# Patient Record
Sex: Male | Born: 1955
Health system: Southern US, Community
[De-identification: ages and names within clinical notes are randomized; demographics above are authoritative.]

## PROBLEM LIST (undated history)

## (undated) DIAGNOSIS — E119 Type 2 diabetes mellitus without complications: Secondary | ICD-10-CM

## (undated) DIAGNOSIS — F329 Major depressive disorder, single episode, unspecified: Secondary | ICD-10-CM

## (undated) DIAGNOSIS — Z87442 Personal history of urinary calculi: Secondary | ICD-10-CM

## (undated) DIAGNOSIS — R251 Tremor, unspecified: Secondary | ICD-10-CM

## (undated) DIAGNOSIS — K219 Gastro-esophageal reflux disease without esophagitis: Secondary | ICD-10-CM

## (undated) DIAGNOSIS — G4733 Obstructive sleep apnea (adult) (pediatric): Secondary | ICD-10-CM

## (undated) DIAGNOSIS — M24111 Other articular cartilage disorders, right shoulder: Secondary | ICD-10-CM

## (undated) DIAGNOSIS — M1990 Unspecified osteoarthritis, unspecified site: Secondary | ICD-10-CM

## (undated) DIAGNOSIS — M7551 Bursitis of right shoulder: Secondary | ICD-10-CM

## (undated) DIAGNOSIS — R000 Tachycardia, unspecified: Secondary | ICD-10-CM

## (undated) HISTORY — PX: FL INJ RT KNEE CT ARTHROGRAM (ARMC HX): HXRAD1306

## (undated) HISTORY — DX: Type 2 diabetes mellitus without complications: E11.9

## (undated) HISTORY — DX: Tachycardia, unspecified: R00.0

## (undated) HISTORY — PX: OTHER SURGICAL HISTORY: SHX169

## (undated) HISTORY — DX: Obstructive sleep apnea (adult) (pediatric): G47.33

## (undated) HISTORY — DX: Major depressive disorder, single episode, unspecified: F32.9

## (undated) HISTORY — DX: Bursitis of right shoulder: M75.51

## (undated) HISTORY — DX: Gastro-esophageal reflux disease without esophagitis: K21.9

## (undated) HISTORY — DX: Other articular cartilage disorders, right shoulder: M24.111

## (undated) HISTORY — PX: COLONOSCOPY: SHX174

## (undated) HISTORY — DX: Tremor, unspecified: R25.1

---

## 2008-08-11 ENCOUNTER — Encounter: Payer: Self-pay | Admitting: Internal Medicine

## 2012-07-30 DIAGNOSIS — F410 Panic disorder [episodic paroxysmal anxiety] without agoraphobia: Secondary | ICD-10-CM | POA: Insufficient documentation

## 2012-07-30 DIAGNOSIS — N529 Male erectile dysfunction, unspecified: Secondary | ICD-10-CM

## 2012-07-30 DIAGNOSIS — E785 Hyperlipidemia, unspecified: Secondary | ICD-10-CM | POA: Insufficient documentation

## 2012-07-30 DIAGNOSIS — K2100 Gastro-esophageal reflux disease with esophagitis, without bleeding: Secondary | ICD-10-CM | POA: Insufficient documentation

## 2012-07-30 DIAGNOSIS — E119 Type 2 diabetes mellitus without complications: Secondary | ICD-10-CM | POA: Insufficient documentation

## 2012-07-30 DIAGNOSIS — I10 Essential (primary) hypertension: Secondary | ICD-10-CM

## 2012-07-30 HISTORY — DX: Essential (primary) hypertension: I10

## 2012-07-30 HISTORY — DX: Hyperlipidemia, unspecified: E78.5

## 2012-07-30 HISTORY — DX: Male erectile dysfunction, unspecified: N52.9

## 2012-07-30 HISTORY — DX: Panic disorder (episodic paroxysmal anxiety): F41.0

## 2012-07-30 HISTORY — DX: Gastro-esophageal reflux disease with esophagitis, without bleeding: K21.00

## 2012-07-30 HISTORY — DX: Type 2 diabetes mellitus without complications: E11.9

## 2017-05-12 DIAGNOSIS — K219 Gastro-esophageal reflux disease without esophagitis: Secondary | ICD-10-CM | POA: Insufficient documentation

## 2017-05-12 DIAGNOSIS — F411 Generalized anxiety disorder: Secondary | ICD-10-CM

## 2017-05-12 DIAGNOSIS — F419 Anxiety disorder, unspecified: Secondary | ICD-10-CM | POA: Insufficient documentation

## 2017-05-12 HISTORY — DX: Generalized anxiety disorder: F41.1

## 2018-08-03 ENCOUNTER — Encounter (INDEPENDENT_AMBULATORY_CARE_PROVIDER_SITE_OTHER): Payer: Self-pay | Admitting: *Deleted

## 2018-08-03 ENCOUNTER — Ambulatory Visit (INDEPENDENT_AMBULATORY_CARE_PROVIDER_SITE_OTHER): Payer: BC Managed Care – PPO | Admitting: Internal Medicine

## 2018-08-03 ENCOUNTER — Encounter (INDEPENDENT_AMBULATORY_CARE_PROVIDER_SITE_OTHER): Payer: Self-pay | Admitting: Internal Medicine

## 2018-08-03 ENCOUNTER — Telehealth (INDEPENDENT_AMBULATORY_CARE_PROVIDER_SITE_OTHER): Payer: Self-pay | Admitting: *Deleted

## 2018-08-03 VITALS — BP 170/90 | HR 64 | Temp 98.0°F | Ht 71.0 in | Wt 218.9 lb

## 2018-08-03 DIAGNOSIS — R195 Other fecal abnormalities: Secondary | ICD-10-CM

## 2018-08-03 HISTORY — DX: Other fecal abnormalities: R19.5

## 2018-08-03 MED ORDER — SUPREP BOWEL PREP KIT 17.5-3.13-1.6 GM/177ML PO SOLN: 1.0000 | Freq: Once | ORAL | 0 refills | Status: AC

## 2018-08-03 NOTE — Telephone Encounter (Signed)
Patient needs suprep 

## 2018-08-03 NOTE — Patient Instructions (Signed)
The risks of bleeding, perforation and infection were reviewed with patient.  

## 2018-08-03 NOTE — Progress Notes (Addendum)
   Subjective:    Patient ID: Paul Webb, male    DOB: 1956/09/03, 62 y.o.   MRN: [id number]  HPI Referred by Dr. Woody Seller for positive stool card/colonoscopy. Has not seen any blood in stool. No change in his stool.  Patient is adopted and unknown if family hx of colon cancer. His appetite is good. No unintentional weight loss.  GERD controlled with Omeprazole.  Last colonoscopy was 2009 and was normal. (Dr. Laural Golden).  06/19/2018 H and H 16.0 and 46.8    Diabetic x 21 yrs.   Review of Systems Past Medical History:  Diagnosis Date  . Diabetes ([place])   . Fast heart beat   . GERD (gastroesophageal reflux disease)     History reviewed. No pertinent surgical history.  No Known Allergies  Current Outpatient Medications on File Prior to Visit  Medication Sig Dispense Refill  . aspirin EC 325 MG tablet Take 325 mg by mouth daily.    [name] atenolol (TENORMIN) 50 MG tablet Take 50 mg by mouth daily.    . cetirizine (ZYRTEC) 10 MG tablet Take 10 mg by mouth daily.    . insulin aspart (NOVOLOG) 100 UNIT/ML injection Inject into the skin 3 (three) times daily before meals. Insulin pump    . LISINOPRIL PO Take by mouth.    [name] omeprazole (PRILOSEC) 40 MG capsule Take 40 mg by mouth daily.    . pravastatin (PRAVACHOL) 80 MG tablet Take 80 mg by mouth daily.    [name] venlafaxine (EFFEXOR) 75 MG tablet Take 75 mg by mouth 2 (two) times daily.    [name] venlafaxine XR (EFFEXOR-XR) 150 MG 24 hr capsule Take 150 mg by mouth daily with breakfast.     No current facility-administered medications on file prior to visit.         Objective:   Physical Exam Blood pressure (!) 170/90, pulse 64, temperature 98 F (36.7 C), height 5\' 11"  (1.803 m), weight 218 lb 14.4 oz (99.3 kg). Alert and oriented. Skin warm and dry. Oral mucosa is moist.   . Sclera anicteric, conjunctivae is pink. Thyroid not enlarged. No cervical lymphadenopathy. Lungs clear. Heart regular rate and rhythm.  Abdomen is soft. Bowel sounds are  positive. No hepatomegaly. No abdominal masses felt. No tenderness.  No edema to lower extremities.           Assessment & Plan:  Guaiac positive stool card. Colonic neoplasm needs to be ruled out.  Hemorrhoids, polps, AVMs in differential diagnosis.

## 2018-10-06 ENCOUNTER — Encounter (HOSPITAL_COMMUNITY): Payer: Self-pay | Admitting: *Deleted

## 2018-10-06 ENCOUNTER — Ambulatory Visit (HOSPITAL_COMMUNITY)
Admission: RE | Admit: 2018-10-06 | Discharge: 2018-10-06 | Disposition: A | Discharge status: Home / Self Care | Payer: BC Managed Care – PPO | Source: Ambulatory Visit | Attending: Internal Medicine | Admitting: Internal Medicine

## 2018-10-06 ENCOUNTER — Other Ambulatory Visit: Payer: Self-pay

## 2018-10-06 ENCOUNTER — Encounter (HOSPITAL_COMMUNITY)
Admission: RE | Disposition: A | Discharge status: Home / Self Care | Payer: Self-pay | Source: Ambulatory Visit | Attending: Internal Medicine

## 2018-10-06 DIAGNOSIS — E119 Type 2 diabetes mellitus without complications: Secondary | ICD-10-CM | POA: Insufficient documentation

## 2018-10-06 DIAGNOSIS — K644 Residual hemorrhoidal skin tags: Secondary | ICD-10-CM | POA: Insufficient documentation

## 2018-10-06 DIAGNOSIS — K219 Gastro-esophageal reflux disease without esophagitis: Secondary | ICD-10-CM | POA: Diagnosis not present

## 2018-10-06 DIAGNOSIS — R195 Other fecal abnormalities: Secondary | ICD-10-CM

## 2018-10-06 DIAGNOSIS — D123 Benign neoplasm of transverse colon: Secondary | ICD-10-CM | POA: Diagnosis not present

## 2018-10-06 DIAGNOSIS — Z87442 Personal history of urinary calculi: Secondary | ICD-10-CM | POA: Diagnosis not present

## 2018-10-06 DIAGNOSIS — Z79899 Other long term (current) drug therapy: Secondary | ICD-10-CM | POA: Diagnosis not present

## 2018-10-06 DIAGNOSIS — Z7982 Long term (current) use of aspirin: Secondary | ICD-10-CM | POA: Insufficient documentation

## 2018-10-06 DIAGNOSIS — K6289 Other specified diseases of anus and rectum: Secondary | ICD-10-CM

## 2018-10-06 DIAGNOSIS — Z794 Long term (current) use of insulin: Secondary | ICD-10-CM | POA: Insufficient documentation

## 2018-10-06 HISTORY — PX: POLYPECTOMY: SHX5525

## 2018-10-06 HISTORY — DX: Personal history of urinary calculi: Z87.442

## 2018-10-06 HISTORY — PX: COLONOSCOPY: SHX5424

## 2018-10-06 SURGERY — COLONOSCOPY
Anesthesia: Moderate Sedation

## 2018-10-06 MED ORDER — STERILE WATER FOR IRRIGATION IR SOLN
Status: DC | PRN
  Administered 2018-10-06: 1.5 mL

## 2018-10-06 MED ORDER — MIDAZOLAM HCL 5 MG/5ML IJ SOLN
INTRAMUSCULAR | Status: DC | PRN
  Administered 2018-10-06: 2 mg via INTRAVENOUS
  Administered 2018-10-06 (×2): 1 mg via INTRAVENOUS
  Administered 2018-10-06 (×3): 2 mg via INTRAVENOUS

## 2018-10-06 MED ORDER — MIDAZOLAM HCL 5 MG/5ML IJ SOLN
INTRAMUSCULAR | Status: AC
  Filled 2018-10-06: qty 10

## 2018-10-06 MED ORDER — SODIUM CHLORIDE 0.9 % IV SOLN
INTRAVENOUS | Status: DC
  Administered 2018-10-06: 1000 mL via INTRAVENOUS

## 2018-10-06 MED ORDER — MEPERIDINE HCL 50 MG/ML IJ SOLN
INTRAMUSCULAR | Status: DC | PRN
  Administered 2018-10-06 (×2): 25 mg

## 2018-10-06 MED ORDER — MEPERIDINE HCL 50 MG/ML IJ SOLN
INTRAMUSCULAR | Status: AC
  Filled 2018-10-06: qty 1

## 2018-10-06 NOTE — H&P (Signed)
Paul Webb is an 62 y.o. male.   Chief Complaint: Patient is here for colonoscopy. HPI: Patient is 62 year old Caucasian male who was found to have heme positive stool and is therefore undergoing diagnostic colonoscopy.  He is on low-dose aspirin.  Last dose was 3 days ago.  He does not take other OTC NSAIDs.  He denies abdominal pain change in bowel habits melena or rectal bleeding.  Last colonoscopy was normal in September 2009. Family history is not available as he was adopted.  Past Medical History:  Diagnosis Date  . Diabetes ([place])   . Fast heart beat   . GERD (gastroesophageal reflux disease)   . History of kidney stones     Past Surgical History:  Procedure Laterality Date  . COLONOSCOPY    . FL INJ RT KNEE CT ARTHROGRAM ([facility] HX)    . Rod in lower leg     trauma  . Rt hernia repair      History reviewed. No pertinent family history. Social History:  reports that he has never smoked. He has never used smokeless tobacco. He reports that he does not drink alcohol or use drugs.  Allergies: No Known Allergies  Medications Prior to Admission  Medication Sig Dispense Refill  . aspirin EC 81 MG tablet Take 81 mg by mouth daily.    [name] atenolol (TENORMIN) 50 MG tablet Take 50 mg by mouth at bedtime.     . cetirizine (ZYRTEC) 10 MG tablet Take 10 mg by mouth at bedtime.     . Cholecalciferol (VITAMIN D) 2000 units tablet Take 2,000 Units by mouth daily.    . fluticasone furoate-vilanterol (BREO ELLIPTA) 100-25 MCG/INH AEPB Inhale 1 puff into the lungs daily as needed (shortness of breath).    [name] ibuprofen (ADVIL,MOTRIN) 200 MG tablet Take 800 mg by mouth daily as needed for headache or moderate pain.    [name] insulin lispro (HUMALOG) 100 UNIT/ML injection Inject 2.5 Units into the skin continuous. Use in insulin pump, 2.5 units per hour    . lisinopril-hydrochlorothiazide (PRINZIDE,ZESTORETIC) 20-25 MG tablet Take 1 tablet by mouth daily.    . Omega-3 Fatty Acids (FISH OIL) 1000 MG CAPS  Take 1,000 mg by mouth daily.    [name] omeprazole (PRILOSEC) 40 MG capsule Take 40 mg by mouth daily.    . pravastatin (PRAVACHOL) 80 MG tablet Take 80 mg by mouth at bedtime.     [name] venlafaxine XR (EFFEXOR-XR) 150 MG 24 hr capsule Take 150 mg by mouth daily with breakfast.    . venlafaxine XR (EFFEXOR-XR) 75 MG 24 hr capsule Take 75 mg by mouth at bedtime.      No results found for this or any previous visit (from the past 48 hour(s)). No results found.  ROS  Blood pressure 130/78, pulse 74, temperature 97.9 F (36.6 C), temperature source Oral, resp. rate 14, height 5\' 11"  (1.803 m), weight 98.4 kg, SpO2 95 %. Physical Exam  Constitutional: He appears well-developed and well-nourished.  HENT:  Mouth/Throat: Oropharynx is clear and moist.  Eyes: Conjunctivae are normal. No scleral icterus.  Neck: No thyromegaly present.  Cardiovascular: Normal rate, regular rhythm and normal heart sounds.  No murmur heard. Respiratory: Effort normal and breath sounds normal.  GI:  Abdomen is full but soft and nontender with organomegaly or masses.  Musculoskeletal: He exhibits no edema.  Neurological: He is alert.  Skin: Skin is warm.     Assessment/Plan Heme positive stool. Diagnostic colonoscopy.  Hildred Laser, MD  10/06/2018, 1:34 PM

## 2018-10-06 NOTE — Op Note (Signed)
[facility] Patient Name: Paul Webb Procedure Date: 10/06/2018 1:15 PM MRN: [id number] Date of Birth: Oct 21, 1956 Attending MD: Hildred Laser , MD CSN: [id number] Age: 62 Admit Type: Outpatient Procedure:                Colonoscopy Indications:              Heme positive stool Providers:                Hildred Laser, MD, Lurline Del, RN, Gerome Sam,                            RN, Randa Spike, Technician Referring MD:             Glenda Chroman, MD Medicines:                Meperidine 50 mg IV, Midazolam 10 mg IV Complications:            No immediate complications. Estimated Blood Loss:     Estimated blood loss was minimal. Procedure:                Pre-Anesthesia Assessment:                           - Prior to the procedure, a History and Physical                            was performed, and patient medications and                            allergies were reviewed. The patient's tolerance of                            previous anesthesia was also reviewed. The risks                            and benefits of the procedure and the sedation                            options and risks were discussed with the patient.                            All questions were answered, and informed consent                            was obtained. Prior Anticoagulants: The patient                            last took aspirin 3 days prior to the procedure.                            ASA Grade Assessment: II - A patient with mild                            systemic disease. After reviewing the risks and  benefits, the patient was deemed in satisfactory                            condition to undergo the procedure.                           After obtaining informed consent, the colonoscope                            was passed under direct vision. Throughout the                            procedure, the patient's blood pressure, pulse, and       oxygen saturations were monitored continuously. The                            CF-HQ190L ([id number]) scope was introduced through                            the anus and advanced to the the cecum, identified                            by appendiceal orifice and ileocecal valve. The                            colonoscopy was performed without difficulty. The                            patient tolerated the procedure well. The quality                            of the bowel preparation was adequate. The                            ileocecal valve, appendiceal orifice, and rectum                            were photographed. Scope In: 1:53:32 PM Scope Out: 2:20:52 PM Scope Withdrawal Time: 0 hours 15 minutes 14 seconds  Total Procedure Duration: 0 hours 27 minutes 20 seconds  Findings:      The perianal and digital rectal examinations were normal.      A small polyp was found in the splenic flexure. The polyp was sessile.       The polyp was removed with a cold snare. Resection and retrieval were       complete.      The exam was otherwise normal throughout the examined colon.      External hemorrhoids were found during retroflexion. The hemorrhoids       were small.      Anal papilla(e) were hypertrophied. Impression:               - One small polyp at the splenic flexure, removed                            with a cold  snare. Resected and retrieved.                           - External hemorrhoids.                           - Anal papilla(e) were hypertrophied. Moderate Sedation:      Moderate (conscious) sedation was administered by the endoscopy nurse       and supervised by the endoscopist. The following parameters were       monitored: oxygen saturation, heart rate, blood pressure, CO2       capnography and response to care. Total physician intraservice time was       35 minutes. Recommendation:           - Patient has a contact number available for                             emergencies. The signs and symptoms of potential                            delayed complications were discussed with the                            patient. Return to normal activities tomorrow.                            Written discharge instructions were provided to the                            patient.                           - Resume previous diet today.                           - Continue present medications.                           - No aspirin, ibuprofen, naproxen, or other                            non-steroidal anti-inflammatory drugs for 1 day.                           - Await pathology results.                           - Repeat colonoscopy is recommended. The                            colonoscopy date will be determined after pathology                            results from today's exam become available for                            review.  Procedure Code(s):        --- Professional ---                           [id number], Colonoscopy, flexible; with removal of                            tumor(s), polyp(s), or other lesion(s) by snare                            technique                           99153, Moderate sedation; each additional 15                            minutes intraservice time                           G0500, Moderate sedation services provided by the                            same physician or other qualified health care                            professional performing a gastrointestinal                            endoscopic service that sedation supports,                            requiring the presence of an independent trained                            observer to assist in the monitoring of the                            patient's level of consciousness and physiological                            status; initial 15 minutes of intra-service time;                            patient age 13 years or older (additional time may                             be reported with [phone], as appropriate) Diagnosis Code(s):        --- Professional ---                           D12.3, Benign neoplasm of transverse colon (hepatic                            flexure or splenic flexure)  K62.89, Other specified diseases of anus and rectum                           K64.4, Residual hemorrhoidal skin tags                           R19.5, Other fecal abnormalities CPT copyright 2018 American Medical Association. All rights reserved. The codes documented in this report are preliminary and upon coder review may  be revised to meet current compliance requirements. Hildred Laser, MD Hildred Laser, MD 10/06/2018 2:28:29 PM This report has been signed electronically. Number of Addenda: 0

## 2018-10-06 NOTE — Progress Notes (Addendum)
Pt CBG is 216 at 1340pm1.0 unit bolus was delivered via pts insulin pump  Dex Blakely Rica Mote, RN

## 2018-10-06 NOTE — Discharge Instructions (Signed)
No aspirin or NSAIDs for 24 hours. Resume other medications and diet as before. No driving for 24 hours. Physician will call with biopsy results.  Colonoscopy, Adult, Care After This sheet gives you information about how to care for yourself after your procedure. Your health care provider may also give you more specific instructions. If you have problems or questions, contact your health care provider. Dr. Laural Golden: [phone].  After hours and weekends call the hospital and have the GI doctor on call paged.  They will call you back. What can I expect after the procedure? After the procedure, it is common to have:  A small amount of blood in your stool for 24 hours after the procedure.  Some gas.  Mild abdominal cramping or bloating.  Follow these instructions at home: General instructions   For the first 24 hours after the procedure: ? Do not drive or use machinery. ? Do not sign important documents. ? Do not drink alcohol. ? Do your  activities at a slower pace than normal. ? Rest often.  Take over-the-counter or prescription medicines only as told by your health care provider.  It is up to you to get the results of your procedure. Ask your health care provider, or the department performing the procedure, when your results will be ready. Relieving cramping and bloating  Try walking around when you have cramps or feel bloated.  Drink enough fluid to keep your urine clear or pale yellow.  Resume your normal diet as instructed by your health care provider. Avoid heavy or fried foods that are hard to digest.  Avoid drinking alcohol for as long as instructed by your health care provider. Contact a health care provider if:  You have blood in your stool 2-3 days after the procedure. Get help right away if:  You have more than a small spotting of blood in your stool.  You pass large blood clots in your stool.  Your abdomen is swollen.  You have nausea or vomiting.  You  have a fever.  You have increasing abdominal pain that is not relieved with medicine. This information is not intended to replace advice given to you by your health care provider. Make sure you discuss any questions you have with your health care provider. Document Released: 06/24/2004 Document Revised: 08/04/2016 Document Reviewed: 01/22/2016 Elsevier Interactive Patient Education  [employer].

## 2018-10-07 LAB — GLUCOSE, CAPILLARY
GLUCOSE-CAPILLARY: 216 mg/dL — AB (ref 70–99)
GLUCOSE-CAPILLARY: 200 mg/dL — AB (ref 70–99)

## 2018-10-12 ENCOUNTER — Encounter (HOSPITAL_COMMUNITY): Payer: Self-pay | Admitting: Internal Medicine

## 2018-10-25 ENCOUNTER — Encounter: Payer: Self-pay | Admitting: *Deleted

## 2018-10-26 ENCOUNTER — Encounter: Payer: Self-pay | Admitting: *Deleted

## 2018-10-26 ENCOUNTER — Encounter

## 2018-10-26 ENCOUNTER — Encounter: Payer: Self-pay | Admitting: Cardiovascular Disease

## 2018-10-26 ENCOUNTER — Ambulatory Visit: Payer: BC Managed Care – PPO | Admitting: Cardiovascular Disease

## 2018-10-26 ENCOUNTER — Telehealth: Payer: Self-pay | Admitting: Cardiovascular Disease

## 2018-10-26 VITALS — BP 130/84 | HR 81 | Ht 71.0 in | Wt 223.0 lb

## 2018-10-26 DIAGNOSIS — R079 Chest pain, unspecified: Secondary | ICD-10-CM

## 2018-10-26 DIAGNOSIS — I10 Essential (primary) hypertension: Secondary | ICD-10-CM | POA: Diagnosis not present

## 2018-10-26 DIAGNOSIS — E785 Hyperlipidemia, unspecified: Secondary | ICD-10-CM

## 2018-10-26 NOTE — Patient Instructions (Addendum)
Medication Instructions:   Your physician recommends that you continue on your current medications as directed. Please refer to the Current Medication list given to you today.  Labwork:  NONE  Testing/Procedures: Your physician has requested that you have en exercise stress myoview. For further information please visit [email or web address]. Please follow instruction sheet, as given.  Follow-Up:  Your physician recommends that you schedule a follow-up appointment in: 3 months.   Any Other Special Instructions Will Be Listed Below (If Applicable).  If you need a refill on your cardiac medications before your next appointment, please call your pharmacy.

## 2018-10-26 NOTE — Telephone Encounter (Signed)
Pre-cert Verification for the following procedure   EXERCISE MYOVIEW  Scheduled for 10-28-2018 at [facility]

## 2018-10-26 NOTE — Progress Notes (Signed)
CARDIOLOGY CONSULT NOTE  Patient ID: Paul Webb MRN: [id number] DOB/AGE: August 18, 1956 62 y.o.  Admit date: (Not on file) Primary Physician: Glenda Chroman, MD Referring Physician: Glenda Chroman, MD  Reason for Consultation: Chest pain  HPI: Paul Webb is a 62 y.o. male who is being seen today for the evaluation of chest pain at the request of Vyas, Dhruv B, MD.   Past medical history includes hypertension, insulin-dependent diabetes mellitus, and hyperlipidemia.  LDL 90 on 06/17/2018.  Additional labs include hemoglobin 16, platelets 246, total cholesterol 161, triglycerides 129, HDL 45, BUN 25, creatinine 1, sodium 139, potassium 3.7, TSH 1.24.  I reviewed notes from his PCP.  He apparently had an episode of chest pain in October which lasted for about 24 hours exacerbated by taking a deep breath.  He had an ECG at his PCPs office which is unavailable at the present time.  He prefers to be called Paul Webb.  He is here with his wife Santiago Glad who is a Marine scientist.  He told me about the episode of chest pain he experienced.  It began in the early afternoon and lasted into the night.  It was exacerbated by taking deep breaths.  He denied having a fever or cough at the time.  His wife auscultated his lungs and they were clear.  He denies palpitations, orthopnea, paroxysmal nocturnal dyspnea, and leg swelling.  He does have a history of anxiety and his wife wondered if he was having a panic attack.  He has not had one prior to that.  He has no prior episodes of chest pain and has had none since.  Family history is unknown as he is adopted.   No Known Allergies  Current Outpatient Medications  Medication Sig Dispense Refill  . aspirin EC 81 MG tablet Take 1 tablet (81 mg total) by mouth daily.    [name] atenolol (TENORMIN) 50 MG tablet Take 50 mg by mouth at bedtime.     . cetirizine (ZYRTEC) 10 MG tablet Take 10 mg by mouth at bedtime.     . Cholecalciferol (VITAMIN D) 2000 units tablet Take  2,000 Units by mouth daily.    . fluticasone furoate-vilanterol (BREO ELLIPTA) 100-25 MCG/INH AEPB Inhale 1 puff into the lungs daily as needed (shortness of breath).    . insulin lispro (HUMALOG) 100 UNIT/ML injection Inject 2.5 Units into the skin continuous. Use in insulin pump, 2.5 units per hour    . lisinopril-hydrochlorothiazide (PRINZIDE,ZESTORETIC) 20-25 MG tablet Take 1 tablet by mouth daily.    . Omega-3 Fatty Acids (FISH OIL) 1000 MG CAPS Take 1,000 mg by mouth daily.    [name] omeprazole (PRILOSEC) 40 MG capsule Take 40 mg by mouth daily.    . pravastatin (PRAVACHOL) 80 MG tablet Take 80 mg by mouth at bedtime.     [name] venlafaxine XR (EFFEXOR-XR) 150 MG 24 hr capsule Take 150 mg by mouth daily with breakfast.    . venlafaxine XR (EFFEXOR-XR) 75 MG 24 hr capsule Take 75 mg by mouth at bedtime.     No current facility-administered medications for this visit.     Past Medical History:  Diagnosis Date  . Diabetes ([place])   . Fast heart beat   . GERD (gastroesophageal reflux disease)   . History of kidney stones     Past Surgical History:  Procedure Laterality Date  . COLONOSCOPY    . COLONOSCOPY N/A 10/06/2018   Procedure: COLONOSCOPY;  Surgeon: Hildred Laser  U, MD;  Location: [facility];  Service: Endoscopy;  Laterality: N/A;  1:25  . FL INJ RT KNEE CT ARTHROGRAM ([facility] HX)    . POLYPECTOMY  10/06/2018   Procedure: POLYPECTOMY;  Surgeon: Rogene Houston, MD;  Location: [facility] SU[facility];  Service: Endoscopy;;  colon   . Rod in lower leg     trauma  . Rt hernia repair      Social History   Socioeconomic History  . Marital status: Married    Spouse name: Not on file  . Number of children: Not on file  . Years of education: Not on file  . Highest education level: Not on file  Occupational History  . Not on file  Social Needs  . Financial resource strain: Not on file  . Food insecurity:    Worry: Not on file    Inability: Not on file  . Transportation needs:     Medical: Not on file    Non-medical: Not on file  Tobacco Use  . Smoking status: Never Smoker  . Smokeless tobacco: Never Used  Substance and Sexual Activity  . Alcohol use: Never    Frequency: Never  . Drug use: Never  . Sexual activity: Not on file  Lifestyle  . Physical activity:    Days per week: Not on file    Minutes per session: Not on file  . Stress: Not on file  Relationships  . Social connections:    Talks on phone: Not on file    Gets together: Not on file    Attends religious service: Not on file    Active member of club or organization: Not on file    Attends meetings of clubs or organizations: Not on file    Relationship status: Not on file  . Intimate partner violence:    Fear of current or ex partner: Not on file    Emotionally abused: Not on file    Physically abused: Not on file    Forced sexual activity: Not on file  Other Topics Concern  . Not on file  Social History Narrative  . Not on file      Current Meds  Medication Sig  . aspirin EC 81 MG tablet Take 1 tablet (81 mg total) by mouth daily.  [name] atenolol (TENORMIN) 50 MG tablet Take 50 mg by mouth at bedtime.   . cetirizine (ZYRTEC) 10 MG tablet Take 10 mg by mouth at bedtime.   . Cholecalciferol (VITAMIN D) 2000 units tablet Take 2,000 Units by mouth daily.  . fluticasone furoate-vilanterol (BREO ELLIPTA) 100-25 MCG/INH AEPB Inhale 1 puff into the lungs daily as needed (shortness of breath).  . insulin lispro (HUMALOG) 100 UNIT/ML injection Inject 2.5 Units into the skin continuous. Use in insulin pump, 2.5 units per hour  . lisinopril-hydrochlorothiazide (PRINZIDE,ZESTORETIC) 20-25 MG tablet Take 1 tablet by mouth daily.  . Omega-3 Fatty Acids (FISH OIL) 1000 MG CAPS Take 1,000 mg by mouth daily.  [name] omeprazole (PRILOSEC) 40 MG capsule Take 40 mg by mouth daily.  . pravastatin (PRAVACHOL) 80 MG tablet Take 80 mg by mouth at bedtime.   [name] venlafaxine XR (EFFEXOR-XR) 150 MG 24 hr capsule Take 150 mg  by mouth daily with breakfast.  . venlafaxine XR (EFFEXOR-XR) 75 MG 24 hr capsule Take 75 mg by mouth at bedtime.      Review of systems complete and found to be negative unless listed above in HPI    Physical exam  Blood pressure 130/84, pulse 81, height 5\' 11"  (1.803 m), weight 223 lb (101.2 kg), SpO2 95 %. General: NAD Neck: No JVD, no thyromegaly or thyroid nodule.  Lungs: Clear to auscultation bilaterally with normal respiratory effort. CV: Nondisplaced PMI. Regular rate and rhythm, normal S1/S2, no S3/S4, no murmur.  No peripheral edema.  No carotid bruit.    Abdomen: Soft, nontender, no distention.  Skin: Intact without lesions or rashes.  Neurologic: Alert and oriented x 3.  Psych: Normal affect. Extremities: No clubbing or cyanosis.  HEENT: Normal.   ECG: Most recent ECG reviewed.   Labs: No results found for: K, BUN, CREATININE, ALT, TSH, HGB   Lipids: No results found for: LDLCALC, LDLDIRECT, CHOL, TRIG, HDL      ASSESSMENT AND PLAN:   1.  Chest pain: Unclear etiology.  He has several cardiovascular risk factors as detailed above.  I will proceed with an exercise Myoview stress test.  Atenolol will be held the day of the test.  2.  Hypertension: Blood pressure is controlled on present therapy.  No changes.  3.  Hyperlipidemia: Lipids reviewed above.  Continue pravastatin 80 mg.    Disposition: Follow up in 3 months  Signed: Kate Sable, M.D., F.A.C.C.  10/26/2018, 2:46 PM

## 2018-10-28 ENCOUNTER — Ambulatory Visit (HOSPITAL_COMMUNITY)
Admission: RE | Admit: 2018-10-28 | Discharge: 2018-10-28 | Disposition: A | Discharge status: Home / Self Care | Payer: BC Managed Care – PPO | Source: Ambulatory Visit | Attending: Cardiovascular Disease | Admitting: Cardiovascular Disease

## 2018-10-28 ENCOUNTER — Encounter (HOSPITAL_COMMUNITY)
Admission: RE | Admit: 2018-10-28 | Discharge: 2018-10-28 | Disposition: A | Discharge status: Home / Self Care | Payer: BC Managed Care – PPO | Source: Ambulatory Visit | Attending: Cardiovascular Disease | Admitting: Cardiovascular Disease

## 2018-10-28 ENCOUNTER — Encounter (HOSPITAL_COMMUNITY): Payer: Self-pay

## 2018-10-28 DIAGNOSIS — R079 Chest pain, unspecified: Secondary | ICD-10-CM

## 2018-10-28 LAB — NM MYOCAR MULTI W/SPECT W/WALL MOTION / EF
CHL CUP RESTING HR STRESS: 76 {beats}/min
CSEPED: 5 min
CSEPEDS: 50 s
CSEPEW: 7 METS
LVDIAVOL: 70 mL (ref 62–150)
LVSYSVOL: 20 mL
MPHR: 160 {beats}/min
Peak HR: 142 {beats}/min
Percent HR: 88 %
RATE: 0.36
RPE: 13
SDS: 4
SRS: 0
SSS: 4
TID: 0.9

## 2018-10-28 MED ORDER — TECHNETIUM TC 99M TETROFOSMIN IV KIT
30.0000 | PACK | Freq: Once | INTRAVENOUS | Status: AC | PRN
  Administered 2018-10-28: 32.5 via INTRAVENOUS

## 2018-10-28 MED ORDER — REGADENOSON 0.4 MG/5ML IV SOLN
INTRAVENOUS | Status: AC
  Filled 2018-10-28: qty 5

## 2018-10-28 MED ORDER — SODIUM CHLORIDE 0.9% FLUSH
INTRAVENOUS | Status: AC
  Administered 2018-10-28: 10 mL via INTRAVENOUS
  Filled 2018-10-28: qty 10

## 2018-10-28 MED ORDER — TECHNETIUM TC 99M TETROFOSMIN IV KIT
10.0000 | PACK | Freq: Once | INTRAVENOUS | Status: AC | PRN
  Administered 2018-10-28: 10.5 via INTRAVENOUS

## 2018-10-28 NOTE — Progress Notes (Signed)
Pt had near syncopal episode after IV insertion and does have history of this with needle sticks. Dr. Register accessed patient. Pt has no complaints currently and states he feels back to normal. No signs of distress vials WNL.

## 2018-10-28 NOTE — Progress Notes (Signed)
Pt had near fainting spell with iv-has happened before.No cp or sob.Pt feels fine now.Pt alert and ox3.vss.Pt to be seen by Cardiology this a.m.

## 2018-11-01 ENCOUNTER — Telehealth: Payer: Self-pay | Admitting: *Deleted

## 2018-11-01 NOTE — Telephone Encounter (Signed)
Notes recorded by Laurine Blazer, LPN on 62/06/3150 at 7:61 PM EST Wife Santiago Glad) notified. Copy to pmd. Follow up scheduled for March. ------  Notes recorded by Herminio Commons, MD on 10/28/2018 at 12:44 PM EST Low risk for blockages. Normal pumping function.

## 2019-01-26 ENCOUNTER — Ambulatory Visit: Payer: BC Managed Care – PPO | Admitting: Cardiovascular Disease

## 2019-02-10 NOTE — Progress Notes (Signed)
Psychiatric Initial Adult Assessment   Patient Identification: Paul Webb MRN:  [id number] Date of Evaluation:  02/16/2019 Referral Source: Glenda Chroman, MD Chief Complaint:  "My whole spirits are broken" Chief Complaint    Depression; Psychiatric Evaluation     Visit Diagnosis:    ICD-10-CM   1. Current moderate episode of major depressive disorder without prior episode (HCC) F32.1     History of Present Illness:   Paul Webb is a 63 y.o. year old male with a history of depression, diabetes, hyperlipidemia, who is referred for aggression.   Patient states that "My whole spirits are broken" over the past two years.  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  He wishes to become independent again, stating that he has "no choice" but to do things what his wife told him to. Although he may wish to do something, it has been very difficult for him to actually do it due to significant fatigue and anhedonia. He states that he used to be very active and independent. He wishes that his wife would understand him better, stating that "it's aggravates" when she is frustrated. He agrees that he sometimes feel isolated when his wife does not help him (to serve lunch) while she is helping people in the assisted living.   Santiago Glad, patient's wife presents to the interview with patient consent.  She states that he stays in the bed most of the time.  Although he may watches TV at times, he stays in the house and he does not go to church anymore. She denies safety concern.   He has insomnia.  He has significant fatigue. He has fair appetite. He has fair concentration. He had fleeting passive SI.  He feels anxious and tense. He feels irritable at times, although he believes it has improved lately. He occasionally has panic attacks. He denies alcohol use or drug use.   Wt Readings from Last 3 Encounters:  02/16/19 225 lb (102.1 kg)  10/26/18 223 lb (101.2 kg)  10/06/18 217  lb (98.4 kg)    Associated Signs/Symptoms: Depression Symptoms:  depressed mood, anhedonia, insomnia, fatigue, difficulty concentrating, anxiety,  (Hypo) Manic Symptoms:  denies decrease need for sleep, euphoria Anxiety Symptoms:  Excessive Worry, Psychotic Symptoms:  denies AH, VH,  PTSD Symptoms: Negative  Past Psychiatric History:  Outpatient: on venlafaxine for anxiety for 20 years Psychiatry admission: denies  Previous suicide attempt: denies  Past trials of medication: ?lexapro, venlafaxine,  History of violence: denies   Previous Psychotropic Medications: Yes   Substance Abuse History in the last 12 months:  No.  Consequences of Substance Abuse: NA  Past Medical History:  Past Medical History:  Diagnosis Date  . Diabetes ([place])   . Fast heart beat   . GERD (gastroesophageal reflux disease)   . History of kidney stones     Past Surgical History:  Procedure Laterality Date  . COLONOSCOPY    . COLONOSCOPY N/A 10/06/2018   Procedure: COLONOSCOPY;  Surgeon: Rogene Houston, MD;  [facility];  Service: Endoscopy;  Laterality: N/A;  1:25  . FL INJ RT KNEE CT ARTHROGRAM ([facility] HX)    . POLYPECTOMY  10/06/2018   Procedure: POLYPECTOMY;  Surgeon: Rogene Houston, MD;  Location: [facility];  Service: Endoscopy;;  colon   . Rod in lower leg     trauma  . Rt hernia repair      Family Psychiatric  History: as below  Family History:  Family History  Adopted: Yes    Social History:   Social History   Socioeconomic History  . Marital status: Married    Spouse name: Not on file  . Number of children: Not on file  . Years of education: Not on file  . Highest education level: Not on file  Occupational History  . Not on file  Social Needs  . Financial resource strain: Not on file  . Food insecurity:    Worry: Not on file    Inability: Not on file  . Transportation needs:    Medical: Not on file    Non-medical: Not on file  Tobacco Use  .  Smoking status: Never Smoker  . Smokeless tobacco: Never Used  Substance and Sexual Activity  . Alcohol use: Never    Frequency: Never  . Drug use: Never  . Sexual activity: Not on file  Lifestyle  . Physical activity:    Days per week: Not on file    Minutes per session: Not on file  . Stress: Not on file  Relationships  . Social connections:    Talks on phone: Not on file    Gets together: Not on file    Attends religious service: Not on file    Active member of club or organization: Not on file    Attends meetings of clubs or organizations: Not on file    Relationship status: Not on file  Other Topics Concern  . Not on file  Social History Narrative  . Not on file    Additional Social History:  Married for 20 years.  He was born in [place]. He was adopted at 34 months old. He reports good relationship with his adoptive parents, who deceased. He has two children with his ex-wife, who deceased when he was 77 year old. He has two step children He lives with his wife. They have assisted living at home where they have three ladies  Allergies:  No Known Allergies  Metabolic Disorder Labs: No results found for: HGBA1C, MPG No results found for: PROLACTIN No results found for: CHOL, TRIG, HDL, CHOLHDL, VLDL, LDLCALC No results found for: TSH  Therapeutic Level Labs: No results found for: LITHIUM No results found for: CBMZ No results found for: VALPROATE  Current Medications: Current Outpatient Medications  Medication Sig Dispense Refill  . aspirin EC 81 MG tablet Take 1 tablet (81 mg total) by mouth daily.    [name] atenolol (TENORMIN) 50 MG tablet Take 50 mg by mouth at bedtime.     . cetirizine (ZYRTEC) 10 MG tablet Take 10 mg by mouth at bedtime.     . Cholecalciferol (VITAMIN D) 2000 units tablet Take 2,000 Units by mouth daily.    . fluticasone furoate-vilanterol (BREO ELLIPTA) 100-25 MCG/INH AEPB Inhale 1 puff into the lungs daily as needed (shortness of breath).     . insulin lispro (HUMALOG) 100 UNIT/ML injection Inject 2.5 Units into the skin continuous. Use in insulin pump, 2.5 units per hour    . lisinopril-hydrochlorothiazide (PRINZIDE,ZESTORETIC) 20-25 MG tablet Take 1 tablet by mouth daily.    . Omega-3 Fatty Acids (FISH OIL) 1000 MG CAPS Take 1,000 mg by mouth daily.    [name] omeprazole (PRILOSEC) 40 MG capsule Take 40 mg by mouth daily.    . pravastatin (PRAVACHOL) 80 MG tablet Take 80 mg by mouth at bedtime.     [name] venlafaxine XR (EFFEXOR-XR) 150 MG 24 hr  capsule Take 150 mg by mouth daily with breakfast.    . venlafaxine XR (EFFEXOR-XR) 75 MG 24 hr capsule Take 75 mg by mouth at bedtime.    [name] buPROPion (WELLBUTRIN XL) 150 MG 24 hr tablet Take 1 tablet (150 mg total) by mouth daily. 30 tablet 0   No current facility-administered medications for this visit.     Musculoskeletal: Strength & Muscle Tone: within normal limits Gait & Station: normal Patient leans: N/A  Psychiatric Specialty Exam: Review of Systems  Psychiatric/Behavioral: Positive for depression. Negative for hallucinations, memory loss, substance abuse and suicidal ideas. The patient is nervous/anxious and has insomnia.   All other systems reviewed and are negative.   Blood pressure 138/86, pulse 69, height 5\' 11"  (1.803 m), weight 225 lb (102.1 kg), SpO2 96 %.Body mass index is 31.38 kg/m.  General Appearance: Fairly Groomed  Eye Contact:  Good  Speech:  Clear and Coherent  Volume:  Normal  Mood:  Depressed  Affect:  Appropriate, Congruent and Restricted  Thought Process:  Coherent  Orientation:  Full (Time, Place, and Person)  Thought Content:  Logical  Suicidal Thoughts:  No  Homicidal Thoughts:  No  Memory:  Immediate;   Good  Judgement:  Good  Insight:  Fair  Psychomotor Activity:  Normal  Concentration:  Concentration: Good and Attention Span: Good  Recall:  Good  Fund of Knowledge:Good  Language: Good  Akathisia:  No  Handed:  Right  AIMS (if indicated):   not done  Assets:  Communication Skills Desire for Improvement  ADL's:  Intact  Cognition: WNL  Sleep:  Poor   TSH 1.36, 12/2018  Screenings:   Assessment and Plan:  TERIUS JACUINDE is a 63 y.o. year old male with a history of depression, anxiety, diabetes, hyperlipidemia, who is referred for aggression.   # MDD, single, recurrent without psychotic features # GAD Patient reports worsening in depressive symptoms in the context of losing his job 2 years ago due to diabetes. Other psychosocial stressors includes marital conflict (he voiced his frustration of not being understood by his wife, who is accompanied to the interview). Will add bupropion as adjunctive treatment for depression.  Discussed potential risk of headache, worsening anxiety.  He has no known history of seizure.  Will continue venlafaxine at this time to target depression and anxiety.  Noted that he has been on this medication for 20 years with some benefit for anxiety.  He is demoralized by medical condition, and will greatly benefit from supportive therapy/CBT; will make referral.   Plan 1. Continue venlafaxine 225 mg daily  2. Start bupropion 150 mg daily  3. Return to clinic in one month for 30 mins 4. Referral to therapy   The patient demonstrates the following risk factors for suicide: Chronic risk factors for suicide include: psychiatric disorder of depression. Acute risk factors for suicide include: family or marital conflict and loss (financial, interpersonal, professional). Protective factors for this patient include: coping skills and hope for the future. Considering these factors, the overall suicide risk at this point appears to be low. Patient is appropriate for outpatient follow up.   Norman Clay, MD 3/25/202011:13 AM

## 2019-02-16 ENCOUNTER — Encounter (HOSPITAL_COMMUNITY): Payer: Self-pay | Admitting: Psychiatry

## 2019-02-16 ENCOUNTER — Encounter (INDEPENDENT_AMBULATORY_CARE_PROVIDER_SITE_OTHER): Payer: Self-pay

## 2019-02-16 ENCOUNTER — Ambulatory Visit (HOSPITAL_COMMUNITY): Payer: BC Managed Care – PPO | Admitting: Psychiatry

## 2019-02-16 ENCOUNTER — Other Ambulatory Visit: Payer: Self-pay

## 2019-02-16 VITALS — BP 138/86 | HR 69 | Ht 71.0 in | Wt 225.0 lb

## 2019-02-16 DIAGNOSIS — Z79899 Other long term (current) drug therapy: Secondary | ICD-10-CM

## 2019-02-16 DIAGNOSIS — F321 Major depressive disorder, single episode, moderate: Secondary | ICD-10-CM

## 2019-02-16 DIAGNOSIS — F419 Anxiety disorder, unspecified: Secondary | ICD-10-CM

## 2019-02-16 MED ORDER — BUPROPION HCL ER (XL) 150 MG PO TB24: 150.0000 mg | ORAL_TABLET | Freq: Every day | ORAL | 0 refills | Status: DC

## 2019-02-16 NOTE — Patient Instructions (Signed)
1. Continue venlafaxine 225 mg daily  2. Start bupropion 150 mg daily  3. Return to clinic in one month for 30 mins 4. Referral to therapy

## 2019-03-15 ENCOUNTER — Other Ambulatory Visit (HOSPITAL_COMMUNITY): Payer: Self-pay | Admitting: Psychiatry

## 2019-03-15 ENCOUNTER — Telehealth (HOSPITAL_COMMUNITY): Payer: Self-pay | Admitting: *Deleted

## 2019-03-15 MED ORDER — BUPROPION HCL ER (XL) 150 MG PO TB24: 150.0000 mg | ORAL_TABLET | Freq: Every day | ORAL | 0 refills | Status: DC

## 2019-03-15 NOTE — Telephone Encounter (Signed)
ordered

## 2019-03-15 NOTE — Telephone Encounter (Signed)
Dr Modesta Messing Patient only has 3 Wellbutrin tablets left  & next visit is  03-22-2019 requesting refill

## 2019-03-16 NOTE — Progress Notes (Signed)
Virtual Visit via Telephone Note  I connected with Paul Webb on 03/22/19 at  1:00 PM EDT by telephone and verified that I am speaking with the correct person using two identifiers.   I discussed the limitations, risks, security and privacy concerns of performing an evaluation and management service by telephone and the availability of in person appointments. I also discussed with the patient that there may be a patient responsible charge related to this service. The patient expressed understanding and agreed to proceed.     I discussed the assessment and treatment plan with the patient. The patient was provided an opportunity to ask questions and all were answered. The patient agreed with the plan and demonstrated an understanding of the instructions.   The patient was advised to call back or seek an in-person evaluation if the symptoms worsen or if the condition fails to improve as anticipated.  I provided 25 minutes of non-face-to-face time during this encounter.   Norman Clay, MD    [facility] MD/PA/NP OP Progress Note  03/22/2019 1:29 PM Paul Webb  MRN:  [id number]  Chief Complaint:  Chief Complaint    Follow-up; Depression     HPI:  This is a follow-up visit for depression.  He states that his wife told him that his mood is "whole lot better" after starting bupropion.  He also agrees with that, stating that he feels more relaxed.  He still struggles with motivation and energy.  He has been working on a house and doing Architect work.  He occasionally does it with his 53 year old grandchild.  He does not think pandemic is affecting his lifestyle.  He does not have any problems staying at home.  Although he used to enjoy working on cars, he has not done it as he does not have any motivation to do so.  He agrees to try doing regular exercise, includes taking a walk with his wife.  He believes that the relationship has been getting better with her.  He understands that she is  trying to understand him more compared to before. He also understands that she is busy at times doing her work while he misses her. He hopes to "feel better" while he does not know what he might do more if he were to feel better. He sleeps at 6 am to noon. He stays up at night.  He has good appetite.  He denies SI.  He denies anxiety or panic attacks.   Visit Diagnosis:    ICD-10-CM   1. Current moderate episode of major depressive disorder without prior episode ([place]) F32.1     Past Psychiatric History: Please see initial evaluation for full details. I have reviewed the history. No updates at this time.     Past Medical History:  Past Medical History:  Diagnosis Date  . Diabetes ([place])   . Fast heart beat   . GERD (gastroesophageal reflux disease)   . History of kidney stones     Past Surgical History:  Procedure Laterality Date  . COLONOSCOPY    . COLONOSCOPY N/A 10/06/2018   Procedure: COLONOSCOPY;  Surgeon: Rogene Houston, MD;  [facility];  Service: Endoscopy;  Laterality: N/A;  1:25  . FL INJ RT KNEE CT ARTHROGRAM ([facility] HX)    . POLYPECTOMY  10/06/2018   Procedure: POLYPECTOMY;  Surgeon: Rogene Houston, MD;  [facility];  Service: Endoscopy;;  colon   . Rod in lower leg  trauma  . Rt hernia repair      Family Psychiatric History: Please see initial evaluation for full details. I have reviewed the history. No updates at this time.     Family History:  Family History  Adopted: Yes    Social History:  Social History   Socioeconomic History  . Marital status: Married    Spouse name: Not on file  . Number of children: Not on file  . Years of education: Not on file  . Highest education level: Not on file  Occupational History  . Not on file  Social Needs  . Financial resource strain: Not on file  . Food insecurity:    Worry: Not on file    Inability: Not on file  . Transportation needs:    Medical: Not on file    Non-medical:  Not on file  Tobacco Use  . Smoking status: Never Smoker  . Smokeless tobacco: Never Used  Substance and Sexual Activity  . Alcohol use: Never    Frequency: Never  . Drug use: Never  . Sexual activity: Not on file  Lifestyle  . Physical activity:    Days per week: Not on file    Minutes per session: Not on file  . Stress: Not on file  Relationships  . Social connections:    Talks on phone: Not on file    Gets together: Not on file    Attends religious service: Not on file    Active member of club or organization: Not on file    Attends meetings of clubs or organizations: Not on file    Relationship status: Not on file  Other Topics Concern  . Not on file  Social History Narrative  . Not on file    Allergies: No Known Allergies  Metabolic Disorder Labs: No results found for: HGBA1C, MPG No results found for: PROLACTIN No results found for: CHOL, TRIG, HDL, CHOLHDL, VLDL, LDLCALC No results found for: TSH  Therapeutic Level Labs: No results found for: LITHIUM No results found for: VALPROATE No components found for:  CBMZ  Current Medications: Current Outpatient Medications  Medication Sig Dispense Refill  . aspirin EC 81 MG tablet Take 1 tablet (81 mg total) by mouth daily.    [name] atenolol (TENORMIN) 50 MG tablet Take 50 mg by mouth at bedtime.     Derrill Memo ON 03/28/2019] buPROPion (WELLBUTRIN XL) 300 MG 24 hr tablet Take 1 tablet (300 mg total) by mouth daily. 90 tablet 0  . cetirizine (ZYRTEC) 10 MG tablet Take 10 mg by mouth at bedtime.     . Cholecalciferol (VITAMIN D) 2000 units tablet Take 2,000 Units by mouth daily.    . fluticasone furoate-vilanterol (BREO ELLIPTA) 100-25 MCG/INH AEPB Inhale 1 puff into the lungs daily as needed (shortness of breath).    . insulin lispro (HUMALOG) 100 UNIT/ML injection Inject 2.5 Units into the skin continuous. Use in insulin pump, 2.5 units per hour    . lisinopril-hydrochlorothiazide (PRINZIDE,ZESTORETIC) 20-25 MG tablet Take 1  tablet by mouth daily.    . Omega-3 Fatty Acids (FISH OIL) 1000 MG CAPS Take 1,000 mg by mouth daily.    [name] omeprazole (PRILOSEC) 40 MG capsule Take 40 mg by mouth daily.    . pravastatin (PRAVACHOL) 80 MG tablet Take 80 mg by mouth at bedtime.     [name] venlafaxine XR (EFFEXOR-XR) 150 MG 24 hr capsule Take 150 mg by mouth daily with breakfast.    . venlafaxine  XR (EFFEXOR-XR) 75 MG 24 hr capsule Take 75 mg by mouth at bedtime.     No current facility-administered medications for this visit.      Musculoskeletal: Strength & Muscle Tone: N/A Gait & Station: N/A Patient leans: N/A  Psychiatric Specialty Exam: Review of Systems  Psychiatric/Behavioral: Positive for depression. Negative for hallucinations, memory loss, substance abuse and suicidal ideas. The patient has insomnia. The patient is not nervous/anxious.   All other systems reviewed and are negative.   There were no vitals taken for this visit.There is no height or weight on file to calculate BMI.  General Appearance: NA  Eye Contact:  NA  Speech:  Clear and Coherent  Volume:  Normal  Mood:  "calmer"  Affect:  NA  Thought Process:  Coherent  Orientation:  Full (Time, Place, and Person)  Thought Content: Logical   Suicidal Thoughts:  No  Homicidal Thoughts:  No  Memory:  Immediate;   Good  Judgement:  Good  Insight:  Good  Psychomotor Activity:  Normal  Concentration:  Concentration: Good and Attention Span: Good  Recall:  Good  Fund of Knowledge: Good  Language: Good  Akathisia:  No  Handed:  Right  AIMS (if indicated): not done  Assets:  Communication Skills Desire for Improvement  ADL's:  Intact  Cognition: WNL  Sleep:  Poor   Screenings:   Assessment and Plan:  Paul Webb is a 63 y.o. year old male with a history of depression, anxiety, diabetes, hyperlipidemia, who presents for follow up appointment for Current moderate episode of major depressive disorder without prior episode ([place])  # MDD, single,  recurrent without psychotic features # GAD There has been overall improvement in depressive symptoms after starting bupropion.  Psychosocial stressors includes demoralization in the context of losing his job 2 years ago due to diabetes, and marital conflict while it has been improving.  Will do further up titration of bupropion to target depression and fatigue.  Noted that patient reports some somnolence after taking medication; he is advised to try taking at night if he has any worsening in somnolence after up titration of the medication.  He has no known history of seizure.  Will continue venlafaxine to target depression and anxiety.  Noted that he has been on this medication for 20 years with some benefit for anxiety.  Discussed behavioral activation.   # Insomnia He has disturbed circadian rhythm. Discussed sleep hygiene.   Plan 1. Continue venlafaxine 225 mg daily  2. Increase bupropion 300 mg daily  3. Next appointment 6/23 at 1:20 for 20 mins Referred to therapy  The patient demonstrates the following risk factors for suicide: Chronic risk factors for suicide include: psychiatric disorder of depression. Acute risk factors for suicide include: family or marital conflict and loss (financial, interpersonal, professional). Protective factors for this patient include: coping skills and hope for the future. Considering these factors, the overall suicide risk at this point appears to be low. Patient is appropriate for outpatient follow up.  The duration of this appointment visit was 25 minutes of non face-to-face time with the patient.  Greater than 50% of this time was spent in counseling, explanation of  diagnosis, planning of further management, and coordination of care.  Norman Clay, MD 03/22/2019, 1:29 PM

## 2019-03-22 ENCOUNTER — Other Ambulatory Visit: Payer: Self-pay

## 2019-03-22 ENCOUNTER — Ambulatory Visit (INDEPENDENT_AMBULATORY_CARE_PROVIDER_SITE_OTHER): Payer: BC Managed Care – PPO | Admitting: Psychiatry

## 2019-03-22 ENCOUNTER — Encounter (HOSPITAL_COMMUNITY): Payer: Self-pay | Admitting: Psychiatry

## 2019-03-22 DIAGNOSIS — F321 Major depressive disorder, single episode, moderate: Secondary | ICD-10-CM

## 2019-03-22 MED ORDER — BUPROPION HCL ER (XL) 300 MG PO TB24: 300.0000 mg | ORAL_TABLET | Freq: Every day | ORAL | 0 refills | Status: DC

## 2019-03-22 NOTE — Patient Instructions (Signed)
1. Continue venlafaxine 225 mg daily  2. Increase bupropion 300 mg daily  3.Next appointment 6/23 at 1:20 for 20 mins

## 2019-03-24 ENCOUNTER — Ambulatory Visit (INDEPENDENT_AMBULATORY_CARE_PROVIDER_SITE_OTHER): Payer: BC Managed Care – PPO | Admitting: Licensed Clinical Social Worker

## 2019-03-24 ENCOUNTER — Encounter (HOSPITAL_COMMUNITY): Payer: Self-pay | Admitting: Licensed Clinical Social Worker

## 2019-03-24 ENCOUNTER — Other Ambulatory Visit: Payer: Self-pay

## 2019-03-24 DIAGNOSIS — F321 Major depressive disorder, single episode, moderate: Secondary | ICD-10-CM

## 2019-03-24 NOTE — Progress Notes (Signed)
Comprehensive Clinical Assessment (CCA) Note  03/24/2019 Paul Webb [id number]  Visit Diagnosis:      ICD-10-CM   1. Current moderate episode of major depressive disorder without prior episode ([name]) F32.1       CCA Part One  Part One has been completed on paper by the patient.  (See scanned document in Chart Review)  CCA Part Two A  Intake/Chief Complaint:  CCA Intake With Chief Complaint CCA Part Two Date: 03/24/19 CCA Part Two Time: 1404 Chief Complaint/Presenting Problem: Anxiety and Mood Patients Currently Reported Symptoms/Problems: Mood; transition from working to not working, [employer], lack of motivation, increase in appetite, has belly, mild irritability, difficulty staying asleep, sadness at times, mild feelings of worthelessness,  health issues   lost his first wife in the early 90's,  Collateral Involvement: None Individual's Strengths: Willing to help others, works with his Emergency planning/management officer, works on cars, good Dealer, helps his children, good grandfather, good husband, Theodoro Kos is important, Good truck Industrial/product designer Preferences: Prefers to stay at home, prefers to be with family, doesn't prefer crowds Individual's Abilities: Good truck driver, 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  Type of Services Patient Feels Are Needed: Therapy, medication  Initial Clinical Notes/Concerns: Symptom have been present for most of his life but increased 2 years ago after he stopped working due to diabetes, symptoms occur daily, symptoms are moderate per patient   Mental Health Symptoms Depression:  Depression: Change in energy/activity, Worthlessness, Increase/decrease in appetite, Irritability, Sleep (too much or little)  Mania:  Mania: N/A  Anxiety:   Anxiety: Worrying  Psychosis:  Psychosis: N/A  Trauma:  Trauma: N/A  Obsessions:  Obsessions: N/A  Compulsions:  Compulsions: N/A  Inattention:  Inattention: N/A  Hyperactivity/Impulsivity:  Hyperactivity/Impulsivity: N/A   Oppositional/Defiant Behaviors:  Oppositional/Defiant Behaviors: N/A  Borderline Personality:  Emotional Irregularity: N/A  Other Mood/Personality Symptoms:  Other Mood/Personality Symtpoms: N/A   Mental Status Exam Appearance and self-care  Stature:  Stature: Average  Weight:  Weight: Average weight  Clothing:  Clothing: Casual  Grooming:  Grooming: Normal  Cosmetic use:  Cosmetic Use: Inappropriate for age  Posture/gait:  Posture/Gait: Normal  Motor activity:  Motor Activity: Not Remarkable  Sensorium  Attention:  Attention: Normal  Concentration:  Concentration: Normal  Orientation:  Orientation: X5  Recall/memory:  Recall/Memory: Normal  Affect and Mood  Affect:  Affect: Depressed  Mood:  Mood: Depressed  Relating  Eye contact:  Eye Contact: Normal  Facial expression:  Facial Expression: Responsive  Attitude toward examiner:  Attitude Toward Examiner: Cooperative  Thought and Language  Speech flow: Speech Flow: Normal  Thought content:  Thought Content: Appropriate to mood and circumstances  Preoccupation:  Preoccupations: (N/A)  Hallucinations:  Hallucinations: (N/A)  Organization:   Logical   Transport planner of Knowledge:  Fund of Knowledge: Average  Intelligence:  Intelligence: Average  Abstraction:  Abstraction: Normal  Judgement:  Judgement: Normal  Reality Testing:  Reality Testing: Adequate  Insight:  Insight: Good  Decision Making:  Decision Making: Normal  Social Functioning  Social Maturity:  Social Maturity: Isolates  Social Judgement:  Social Judgement: Normal  Stress  Stressors:  Stressors: Illness  Coping Ability:  Coping Ability: Deficient supports  Skill Deficits:   Transition to not working  Supports:   Family   Family and Psychosocial History: Family history Marital status: Married Number of Years Married: 67 What types of issues is patient dealing with in the relationship?: Sometimes feels like she doesn't understand his  depression Additional relationship information: 2nd marriage, lost his first wife  Are you sexually active?: No What is your sexual orientation?: Heterosexual  Has your sexual activity been affected by drugs, alcohol, medication, or emotional stress?: Medication, diabeties  Does patient have children?: Yes How many children?: 4 How is patient's relationship with their children?: Son and daughter, Good relationships, Stepsons:  Good realtionship  Childhood History:  Childhood History By whom was/is the patient raised?: Adoptive parents Additional childhood history information: Patient had a good childhood. Patient was adopted at age 23.  Description of patient's relationship with caregiver when they were a child: Mother:  Good relationship    Father: Good relationship Patient's description of current relationship with people who raised him/her: Mother:  Deceased     Father: Deceased  How were you disciplined when you got in trouble as a child/adolescent?: Spanked, talked to, things taken away, grounded  Does patient have siblings?: Yes Number of Siblings: 1 Description of patient's current relationship with siblings: Sister, deceased  Did patient suffer any verbal/emotional/physical/sexual abuse as a child?: No Did patient suffer from severe childhood neglect?: No Has patient ever been sexually abused/assaulted/raped as an adolescent or adult?: No Was the patient ever a victim of a crime or a disaster?: No Witnessed domestic violence?: No Has patient been effected by domestic violence as an adult?: No  CCA Part Two B  Employment/Work Situation: Employment / Work Copywriter, advertising Employment situation: Unemployed Patient's job has been impacted by current illness: Yes Describe how patient's job has been impacted: Couldn't drive due to his health What is the longest time patient has a held a job?: 16 years Where was the patient employed at that time?: [employer] Did You Receive Any  Psychiatric Treatment/Services While in the [employer]?: No Are There Guns or Other Weapons in [place]?: Yes Types of Guns/Weapons: shotgun, handgun, rifle Are These Psychologist, educational?: Yes  Education: Education School Currently Attending: N/A: Adult  Last Grade Completed: 12 Name of [employer]: [employer]  Did Teacher, adult education From [employer]?: Yes Did Physicist, medical?: (Some courses) Did You Attend Graduate School?: No Did You Have Any Special Interests In School?: Sports: football, wrestling, shop Did You Have An Individualized Education Program (IIEP): No Did You Have Any Difficulty At School?: No  Religion: Religion/Spirituality Are You A Religious Person?: Yes What is Your Religious Affiliation?: Baptist How Might This Affect Treatment?: Support in treatment  Leisure/Recreation: Leisure / Recreation Leisure and Hobbies: Working on cars, works with his hands   Exercise/Diet: Exercise/Diet Do You Exercise?: No Have You Gained or Lost A Significant Amount of Weight in the Past Six Months?: No Do You Follow a Special Diet?: No Do You Have Any Trouble Sleeping?: Yes Explanation of Sleeping Difficulties: Difficulty staying asleep, occasionally has things on his mind   CCA Part Two C  Alcohol/Drug Use: Alcohol / Drug Use Pain Medications: See patient MAR Prescriptions: See patient MAR Over the Counter: See patient MAR  History of alcohol / drug use?: No history of alcohol / drug abuse                      CCA Part Three  ASAM's:  Six Dimensions of Multidimensional Assessment  Dimension 1:  Acute Intoxication and/or Withdrawal Potential:  Dimension 1:  Comments: None  Dimension 2:  Biomedical Conditions and Complications:  Dimension 2:  Comments: None  Dimension 3:  Emotional, Behavioral, or Cognitive Conditions and Complications:  Dimension 3:  Comments: None  Dimension 4:  Readiness to Change:  Dimension 4:  Comments: None  Dimension 5:  Relapse,  Continued use, or Continued Problem Potential:  Dimension 5:  Comments: None  Dimension 6:  Recovery/Living Environment:  Dimension 6:  Recovery/Living Environment Comments: None   Substance use Disorder (SUD)    Social Function:  Social Functioning Social Maturity: Isolates Social Judgement: Normal  Stress:  Stress Stressors: Illness Coping Ability: Deficient supports Patient Takes Medications The Way The Doctor Instructed?: Yes Priority Risk: Low Acuity  Risk Assessment- Self-Harm Potential: Risk Assessment For Self-Harm Potential Thoughts of Self-Harm: No current thoughts Method: No plan Availability of Means: No access/NA  Risk Assessment -Dangerous to Others Potential: Risk Assessment For Dangerous to Others Potential Method: No Plan Availability of Means: No access or NA Intent: Vague intent or NA Notification Required: No need or identified person  DSM5 Diagnoses: Patient Active Problem List   Diagnosis Date Noted  . Guaiac positive stools 08/03/2018    Patient Centered Plan: Patient is on the following Treatment Plan(s):  Depression  Recommendations for Services/Supports/Treatments: Recommendations for Services/Supports/Treatments Recommendations For Services/Supports/Treatments: Individual Therapy, Medication Management  Treatment Plan Summary: OP Treatment Plan Summary: Ginnie Smart "Lanny Hurst" will manage his mood as evidenced by regulating sleep, increasing energy, and increasing motivation for daily life for 5 out of 7 days for 60 days.   Referrals to Alternative Service(s): Referred to Alternative Service(s):   Place:   Date:   Time:    Referred to Alternative Service(s):   Place:   Date:   Time:    Referred to Alternative Service(s):   Place:   Date:   Time:    Referred to Alternative Service(s):   Place:   Date:   Time:     Glori Bickers, LCSW

## 2019-04-14 ENCOUNTER — Ambulatory Visit (INDEPENDENT_AMBULATORY_CARE_PROVIDER_SITE_OTHER): Payer: BC Managed Care – PPO | Admitting: Licensed Clinical Social Worker

## 2019-04-14 ENCOUNTER — Encounter (HOSPITAL_COMMUNITY): Payer: Self-pay | Admitting: Licensed Clinical Social Worker

## 2019-04-14 ENCOUNTER — Other Ambulatory Visit: Payer: Self-pay

## 2019-04-14 DIAGNOSIS — F321 Major depressive disorder, single episode, moderate: Secondary | ICD-10-CM

## 2019-04-14 NOTE — Progress Notes (Signed)
Virtual Visit via Telephone Note  I connected with Paul Webb on 04/14/19 at 10:00 AM EDT by telephone and verified that I am speaking with the correct person using two identifiers.  Location: Patient: Home Provider: Office   I discussed the limitations, risks, security and privacy concerns of performing an evaluation and management service by telephone and the availability of in person appointments. I also discussed with the patient that there may be a patient responsible charge related to this service. The patient expressed understanding and agreed to proceed.   Participation Level: Active  Behavioral Response: CasualAlertDepressed  Type of Therapy: Individual Therapy  Treatment Goals addressed: Coping  Interventions: CBT and Solution Focused  Summary: Paul Webb is a 63 y.o. male who presents oriented x5 (person, place, situation, time and object), casually dressed, appropriately groomed, average height, average weight, and cooperative to address mood. Patient has a history of medical treatment including diabetes and GERD. Patient has a minimal history of mental health treatment including medication management. Patient denies suicidal and homicidal ideations. Patient denies psychosis including auditory and visual hallucinations. Patient denies substance abuse. Patient is at low risk for lethality.  Physically:  Patient is tired. He has disrupted sleep. He falls asleep for a few hours at night but then wakes up and is up for several hours then goes back to sleep. Patient's energy and activity level is impacted  Spiritually/values: No issues identified.  Relationships: Patient is getting along with his wife. He does get angry at her sometimes when he is overwhelmed.  Emotionally/Mentally/Behavior:  Patient feels down. Patient is lacking motivation and get overwhelmed easily. After discussion, patient understood that he needs to examine his thoughts to identify what is impacting his  motivation. Patient also agreed to make changes to his sleep routine to impact sleep.   Patient engaged in session. He responded well to interventions. Patient continues to meet criteria for Current moderate episode of major depressive disorder without prior episode. Patient will continue in outpatient therapy due to being least restrictive service to meet his needs. Patient made minimal progress on his goals at this time.   Suicidal/Homicidal: Negativewithout intent/plan  Therapist Response: Therapist reviewed patient's recent thoughts and behavior. Therapist utilized CBT to address mood. Therapist processed patient's thoughts to identify triggers for mood. Therapist discussed with patient CBT, examining thoughts and adjusting sleep habits.   Plan: Return again in 2-3 weeks.  Diagnosis: Axis I: Current moderate episode of major depressive disorder without prior episode    Axis II: No diagnosis    I discussed the assessment and treatment plan with the patient. The patient was provided an opportunity to ask questions and all were answered. The patient agreed with the plan and demonstrated an understanding of the instructions.   The patient was advised to call back or seek an in-person evaluation if the symptoms worsen or if the condition fails to improve as anticipated.  I provided 40 minutes of non-face-to-face time during this encounter.  Glori Bickers, LCSW 04/14/2019

## 2019-04-19 ENCOUNTER — Telehealth (HOSPITAL_COMMUNITY): Payer: Self-pay | Admitting: *Deleted

## 2019-04-19 NOTE — Telephone Encounter (Signed)
APPT SCHEDULED FOR 6/23

## 2019-04-19 NOTE — Telephone Encounter (Signed)
I am not sure if it is due to bupropion. Advise them to have sooner appointment.

## 2019-04-19 NOTE — Telephone Encounter (Signed)
Dr Modesta Messing Patient wife called  & said " Wellbutrin  Initial dosage wasn't to bad, now he's sleeping slot more during the day  & not sleeping @ all during the night".

## 2019-04-19 NOTE — Telephone Encounter (Signed)
YES 6/3

## 2019-04-20 NOTE — Progress Notes (Signed)
Virtual Visit via Telephone Note  I connected with Paul Webb on 04/27/19 at  8:20 AM EDT by telephone and verified that I am speaking with the correct person using two identifiers.   I discussed the limitations, risks, security and privacy concerns of performing an evaluation and management service by telephone and the availability of in person appointments. I also discussed with the patient that there may be a patient responsible charge related to this service. The patient expressed understanding and agreed to proceed.    I discussed the assessment and treatment plan with the patient. The patient was provided an opportunity to ask questions and all were answered. The patient agreed with the plan and demonstrated an understanding of the instructions.   The patient was advised to call back or seek an in-person evaluation if the symptoms worsen or if the condition fails to improve as anticipated.  I provided 15 minutes of non-face-to-face time during this encounter.   Norman Clay, MD    [place] MD/PA/NP OP Progress Note  04/27/2019 8:49 AM Paul Webb  MRN:  [id number]  Chief Complaint:  Chief Complaint    Depression; Follow-up     HPI:  This is a follow-up appointment for depression.  This appointment is made sooner than scheduled date due to daytime fatigue.   He states that he continues to have lack of energy during the day.  He does not think it got worse after up titration of bupropion.  He feels content, staying at home most of the time except that he might work on house project.  He has anhedonia.  He has middle insomnia; he usually wakes up around 3 AM. He is aware that he snores at night.  Although he was once told that he has sleep apnea, he did not have follow-up appointment.  This was a few years ago.  He has good appetite.  He has fair concentration.  He denies SI.  He denies anxiety or panic attacks.  He agrees to have daily routine, and try taking a walk 3-5 times per  week.      Visit Diagnosis:    ICD-10-CM   1. Current moderate episode of major depressive disorder without prior episode ([place]) F32.1   2. Insomnia, unspecified type G47.00 Ambulatory referral to Neurology    Past Psychiatric History: Please see initial evaluation for full details. I have reviewed the history. No updates at this time.     Past Medical History:  Past Medical History:  Diagnosis Date  . Diabetes ([place])   . Fast heart beat   . GERD (gastroesophageal reflux disease)   . History of kidney stones     Past Surgical History:  Procedure Laterality Date  . COLONOSCOPY    . COLONOSCOPY N/A 10/06/2018   Procedure: COLONOSCOPY;  Surgeon: Rogene Houston, MD;  [facility]: [facility];  Service: Endoscopy;  Laterality: N/A;  1:25  . FL INJ RT KNEE CT ARTHROGRAM ([facility] HX)    . POLYPECTOMY  10/06/2018   Procedure: POLYPECTOMY;  Surgeon: Rogene Houston, MD;  Location: [facility];  Service: Endoscopy;;  colon   . Rod in lower leg     trauma  . Rt hernia repair      Family Psychiatric History: Please see initial evaluation for full details. I have reviewed the history. No updates at this time.     Family History:  Family History  Adopted: Yes    Social History:  Social History  Socioeconomic History  . Marital status: Married    Spouse name: Not on file  . Number of children: Not on file  . Years of education: Not on file  . Highest education level: Not on file  Occupational History  . Not on file  Social Needs  . Financial resource strain: Not on file  . Food insecurity:    Worry: Not on file    Inability: Not on file  . Transportation needs:    Medical: Not on file    Non-medical: Not on file  Tobacco Use  . Smoking status: Never Smoker  . Smokeless tobacco: Never Used  Substance and Sexual Activity  . Alcohol use: Never    Frequency: Never  . Drug use: Never  . Sexual activity: Not on file  Lifestyle  . Physical activity:    Days per  week: Not on file    Minutes per session: Not on file  . Stress: Not on file  Relationships  . Social connections:    Talks on phone: Not on file    Gets together: Not on file    Attends religious service: Not on file    Active member of club or organization: Not on file    Attends meetings of clubs or organizations: Not on file    Relationship status: Not on file  Other Topics Concern  . Not on file  Social History Narrative  . Not on file    Allergies: No Known Allergies  Metabolic Disorder Labs: No results found for: HGBA1C, MPG No results found for: PROLACTIN No results found for: CHOL, TRIG, HDL, CHOLHDL, VLDL, LDLCALC No results found for: TSH  Therapeutic Level Labs: No results found for: LITHIUM No results found for: VALPROATE No components found for:  CBMZ  Current Medications: Current Outpatient Medications  Medication Sig Dispense Refill  . aspirin EC 81 MG tablet Take 1 tablet (81 mg total) by mouth daily.    [name] atenolol (TENORMIN) 50 MG tablet Take 50 mg by mouth at bedtime.     [name] buPROPion (WELLBUTRIN XL) 300 MG 24 hr tablet Take 1 tablet (300 mg total) by mouth daily. 90 tablet 0  . cetirizine (ZYRTEC) 10 MG tablet Take 10 mg by mouth at bedtime.     . Cholecalciferol (VITAMIN D) 2000 units tablet Take 2,000 Units by mouth daily.    . fluticasone furoate-vilanterol (BREO ELLIPTA) 100-25 MCG/INH AEPB Inhale 1 puff into the lungs daily as needed (shortness of breath).    . insulin lispro (HUMALOG) 100 UNIT/ML injection Inject 2.5 Units into the skin continuous. Use in insulin pump, 2.5 units per hour    . lisinopril-hydrochlorothiazide (PRINZIDE,ZESTORETIC) 20-25 MG tablet Take 1 tablet by mouth daily.    . Omega-3 Fatty Acids (FISH OIL) 1000 MG CAPS Take 1,000 mg by mouth daily.    [name] omeprazole (PRILOSEC) 40 MG capsule Take 40 mg by mouth daily.    . pravastatin (PRAVACHOL) 80 MG tablet Take 80 mg by mouth at bedtime.     [name] venlafaxine XR (EFFEXOR-XR) 150 MG  24 hr capsule Take 150 mg by mouth daily with breakfast.    . venlafaxine XR (EFFEXOR-XR) 75 MG 24 hr capsule Take 75 mg by mouth at bedtime.     No current facility-administered medications for this visit.      Musculoskeletal: Strength & Muscle Tone: N/A Gait & Station: N/A Patient leans: N/A  Psychiatric Specialty Exam: Review of Systems  Psychiatric/Behavioral: Positive for depression. Negative  for hallucinations, memory loss, substance abuse and suicidal ideas. The patient has insomnia. The patient is not nervous/anxious.   All other systems reviewed and are negative.   There were no vitals taken for this visit.There is no height or weight on file to calculate BMI.  General Appearance: NA  Eye Contact:  NA  Speech:  Clear and Coherent  Volume:  Normal  Mood:  fatigue  Affect:  NA  Thought Process:  Coherent  Orientation:  Full (Time, Place, and Person)  Thought Content: Logical   Suicidal Thoughts:  No  Homicidal Thoughts:  No  Memory:  Immediate;   Good  Judgement:  Good  Insight:  Fair  Psychomotor Activity:  Normal  Concentration:  Concentration: Good and Attention Span: Good  Recall:  Good  Fund of Knowledge: Good  Language: Good  Akathisia:  No  Handed:  Right  AIMS (if indicated): not done  Assets:  Communication Skills Desire for Improvement  ADL's:  Intact  Cognition: WNL  Sleep:  Poor   Screenings:   Assessment and Plan:  Paul Webb is a 63 y.o. year old male with a history of depression, anxiety, diabetes, hyperlipidemia , who presents for follow up appointment for Current moderate episode of major depressive disorder without prior episode ([facility])  Insomnia, unspecified type - Plan: Ambulatory referral to Neurology  # MDD, single, recurrent without psychotic features # GAD Although there has been improvement in anxiety, he continues to report significant fatigue and anhedonia.  Psychosocial stressors includes demoralization in the context of  losing his job 2 years ago due to diabetes, and possible marital conflict.  Will continue current regiment at this time and will first work on behavioral activation, and also rule out medical cause which contributing to his current symptoms.  Will continue bupropion as adjunctive treatment for depression.  He has no known history of seizure.  Will continue venlafaxine to target depression and anxiety.  Discussed behavioral activation.   # Insomnia He reports snoring, significant fatigue.  He also states that he may have been diagnosed with sleep apnea before.  Will make referral for further evaluation/treatment.   Plan I have reviewed and updated plans as below 1. Continue venlafaxine 225 mg daily  2. Continue bupropion 300 mg daily (limited benefit from uptitration of 150 to 300 mg ) 3.Next appointment 7/2 at 2:40 for 20 mins, phone - Referral to neurology for sleep study - consider checking TSH at the next encounter   The patient demonstrates the following risk factors for suicide: Chronic risk factors for suicide include:psychiatric disorder ofdepression. Acute risk factorsfor suicide include: family or marital conflict and loss (financial, interpersonal, professional). Protective factorsfor this patient include: coping skills and hope for the future. Considering these factors, the overall suicide risk at this point appears to below. Patientisappropriate for outpatient follow up.  Norman Clay, MD 04/27/2019, 8:49 AM

## 2019-04-27 ENCOUNTER — Ambulatory Visit (INDEPENDENT_AMBULATORY_CARE_PROVIDER_SITE_OTHER): Payer: BC Managed Care – PPO | Admitting: Psychiatry

## 2019-04-27 ENCOUNTER — Encounter (HOSPITAL_COMMUNITY): Payer: Self-pay | Admitting: Psychiatry

## 2019-04-27 ENCOUNTER — Other Ambulatory Visit: Payer: Self-pay

## 2019-04-27 DIAGNOSIS — G4700 Insomnia, unspecified: Secondary | ICD-10-CM | POA: Diagnosis not present

## 2019-04-27 DIAGNOSIS — F321 Major depressive disorder, single episode, moderate: Secondary | ICD-10-CM

## 2019-04-27 NOTE — Patient Instructions (Signed)
1. Continue venlafaxine 225 mg daily  2. Continue bupropion 300 mg daily (limited benefit from uptitration of 150 to 300 mg ) 3.Next appointment 7/2 at 2:40 for 20 mins, phone - Referral to neurology for evaluation of sleep apnea

## 2019-05-05 ENCOUNTER — Other Ambulatory Visit: Payer: Self-pay

## 2019-05-05 ENCOUNTER — Ambulatory Visit (HOSPITAL_COMMUNITY): Payer: BC Managed Care – PPO | Admitting: Licensed Clinical Social Worker

## 2019-05-10 ENCOUNTER — Telehealth: Payer: Self-pay

## 2019-05-10 ENCOUNTER — Institutional Professional Consult (permissible substitution): Payer: Self-pay | Admitting: Neurology

## 2019-05-10 NOTE — Telephone Encounter (Signed)
Pt did not show for their appt with Dr. Athar today.  

## 2019-05-17 ENCOUNTER — Ambulatory Visit (HOSPITAL_COMMUNITY): Payer: BC Managed Care – PPO | Admitting: Psychiatry

## 2019-05-18 NOTE — Progress Notes (Signed)
Virtual Visit via Telephone Note  I connected with Paul Webb on 05/26/19 at  2:40 PM EDT by telephone and verified that I am speaking with the correct person using two identifiers.   I discussed the limitations, risks, security and privacy concerns of performing an evaluation and management service by telephone and the availability of in person appointments. I also discussed with the patient that there may be a patient responsible charge related to this service. The patient expressed understanding and agreed to proceed.      I discussed the assessment and treatment plan with the patient. The patient was provided an opportunity to ask questions and all were answered. The patient agreed with the plan and demonstrated an understanding of the instructions.   The patient was advised to call back or seek an in-person evaluation if the symptoms worsen or if the condition fails to improve as anticipated.  I provided 15 minutes of non-face-to-face time during this encounter.   Norman Clay, MD    [facility] MD/PA/NP OP Progress Note  05/26/2019 2:53 PM SAUNDERS A[place]INGTON  MRN:  [id number]  Chief Complaint:  Chief Complaint    Depression; Follow-up     HPI:  - He no showed to appointment at neurology for sleep apnea This is a follow-up appointment for depression.  He states that he has been feeling better and resting better.  He has been working on sleep hygiene, and he has less fatigue.  He also finds working on property to be helpful so that he can stay busy.  He feels optimistic about the future.  He still feels irritable at times; he especially tends to get overwhelmed when he has many things to do.  He is not interested in going to sleep clinic anymore now that he sleeps better.  Has fair concentration.  He has fair motivation and energy.  He denies SI.  He feels anxious and tense at times.  His panic attacks.   Visit Diagnosis:    ICD-10-CM   1. Current mild episode of major depressive disorder  without prior episode ([place])  F32.0     Past Psychiatric History: Please see initial evaluation for full details. I have reviewed the history. No updates at this time.     Past Medical History:  Past Medical History:  Diagnosis Date  . Diabetes ([place])   . Fast heart beat   . GERD (gastroesophageal reflux disease)   . History of kidney stones     Past Surgical History:  Procedure Laterality Date  . COLONOSCOPY    . COLONOSCOPY N/A 10/06/2018   Procedure: COLONOSCOPY;  Surgeon: Rogene Houston, MD;  [facility];  Service: Endoscopy;  Laterality: N/A;  1:25  . FL INJ RT KNEE CT ARTHROGRAM ([facility] HX)    . POLYPECTOMY  10/06/2018   Procedure: POLYPECTOMY;  Surgeon: Rogene Houston, MD;  [facility];  Service: Endoscopy;;  colon   . Rod in lower leg     trauma  . Rt hernia repair      Family Psychiatric History: Please see initial evaluation for full details. I have reviewed the history. No updates at this time.     Family History:  Family History  Adopted: Yes    Social History:  Social History   Socioeconomic History  . Marital status: Married    Spouse name: Not on file  . Number of children: Not on file  . Years of education: Not on file  .  Highest education level: Not on file  Occupational History  . Not on file  Social Needs  . Financial resource strain: Not on file  . Food insecurity    Worry: Not on file    Inability: Not on file  . Transportation needs    Medical: Not on file    Non-medical: Not on file  Tobacco Use  . Smoking status: Never Smoker  . Smokeless tobacco: Never Used  Substance and Sexual Activity  . Alcohol use: Never    Frequency: Never  . Drug use: Never  . Sexual activity: Not on file  Lifestyle  . Physical activity    Days per week: Not on file    Minutes per session: Not on file  . Stress: Not on file  Relationships  . Social Herbalist on phone: Not on file    Gets together: Not on file     Attends religious service: Not on file    Active member of club or organization: Not on file    Attends meetings of clubs or organizations: Not on file    Relationship status: Not on file  Other Topics Concern  . Not on file  Social History Narrative  . Not on file    Allergies: No Known Allergies  Metabolic Disorder Labs: No results found for: HGBA1C, MPG No results found for: PROLACTIN No results found for: CHOL, TRIG, HDL, CHOLHDL, VLDL, LDLCALC No results found for: TSH  Therapeutic Level Labs: No results found for: LITHIUM No results found for: VALPROATE No components found for:  CBMZ  Current Medications: Current Outpatient Medications  Medication Sig Dispense Refill  . aspirin EC 81 MG tablet Take 1 tablet (81 mg total) by mouth daily.    [name] atenolol (TENORMIN) 50 MG tablet Take 50 mg by mouth at bedtime.     Derrill Memo ON 06/27/2019] buPROPion (WELLBUTRIN XL) 300 MG 24 hr tablet Take 1 tablet (300 mg total) by mouth daily. 90 tablet 0  . cetirizine (ZYRTEC) 10 MG tablet Take 10 mg by mouth at bedtime.     . Cholecalciferol (VITAMIN D) 2000 units tablet Take 2,000 Units by mouth daily.    . fluticasone furoate-vilanterol (BREO ELLIPTA) 100-25 MCG/INH AEPB Inhale 1 puff into the lungs daily as needed (shortness of breath).    . insulin lispro (HUMALOG) 100 UNIT/ML injection Inject 2.5 Units into the skin continuous. Use in insulin pump, 2.5 units per hour    . lisinopril-hydrochlorothiazide (PRINZIDE,ZESTORETIC) 20-25 MG tablet Take 1 tablet by mouth daily.    . Omega-3 Fatty Acids (FISH OIL) 1000 MG CAPS Take 1,000 mg by mouth daily.    [name] omeprazole (PRILOSEC) 40 MG capsule Take 40 mg by mouth daily.    . pravastatin (PRAVACHOL) 80 MG tablet Take 80 mg by mouth at bedtime.     [name] venlafaxine XR (EFFEXOR-XR) 150 MG 24 hr capsule Take 150 mg by mouth daily with breakfast.    . venlafaxine XR (EFFEXOR-XR) 75 MG 24 hr capsule Take 75 mg by mouth at bedtime.     No current  facility-administered medications for this visit.      Musculoskeletal: Strength & Muscle Tone: N/A Gait & Station: N/A Patient leans: N/A  Psychiatric Specialty Exam: Review of Systems  Psychiatric/Behavioral: Negative for depression, hallucinations, memory loss, substance abuse and suicidal ideas. The patient is nervous/anxious. The patient does not have insomnia.   All other systems reviewed and are negative.   There were  no vitals taken for this visit.There is no height or weight on file to calculate BMI.  General Appearance: NA  Eye Contact:  NA  Speech:  Clear and Coherent  Volume:  Normal  Mood:  "better"  Affect:  NA  Thought Process:  Coherent  Orientation:  Full (Time, Place, and Person)  Thought Content: Logical   Suicidal Thoughts:  No  Homicidal Thoughts:  No  Memory:  Immediate;   Good  Judgement:  Good  Insight:  Fair  Psychomotor Activity:  Normal  Concentration:  Concentration: Good and Attention Span: Good  Recall:  Good  Fund of Knowledge: Good  Language: Good  Akathisia:  No  Handed:  Right  AIMS (if indicated): not done  Assets:  Communication Skills Desire for Improvement  ADL's:  Intact  Cognition: WNL  Sleep:  Good   Screenings:   Assessment and Plan:  WAYMON LASER is a 63 y.o. year old male with a history of depression, anxiety, diabetes, hyperlipidemia , who presents for follow up appointment for depression.   # MDD, single, mild, recurrent without psychotic features There has been steady improvement in depressive symptoms/fatigue and anxiety.  Psychosocial stressors includes demoralization in the context of losing his job 2 years ago due to diabetes, and some marital conflict.  Will continue current medication regimen at this time; will continue venlafaxine to target depression.  Will continue bupropion as adjunctive treatment for depression.  He has no known history of seizure.  Discussed behavioral activation.  Noted that although he is  strongly recommended to continue therapy, he declines this option at this time.  We will continue to discuss as needed.   # Insomnia Reports improvement in sleep after working on sleep hygiene.  He would like to hold referral at this time.   Plan I have reviewed and updated plans as below 1. Continue venlafaxine 225 mg daily  2.Continuebupropion 300 mg daily (limited benefit from uptitration of 150 to 300 mg ) 3.Next appointment: 9/29 at 10:20 AM for 20 mins, video - he declined therapy follow up - Referred to neurology for sleep study: he wants to hold this time - consider checking TSH at the next encounter   The patient demonstrates the following risk factors for suicide: Chronic risk factors for suicide include:psychiatric disorder ofdepression. Acute risk factorsfor suicide include: family or marital conflict and loss (financial, interpersonal, professional). Protective factorsfor this patient include: coping skills and hope for the future. Considering these factors, the overall suicide risk at this point appears to below. Patientisappropriate for outpatient follow up.  Norman Clay, MD 05/26/2019, 2:53 PM

## 2019-05-26 ENCOUNTER — Other Ambulatory Visit: Payer: Self-pay

## 2019-05-26 ENCOUNTER — Ambulatory Visit (INDEPENDENT_AMBULATORY_CARE_PROVIDER_SITE_OTHER): Payer: BC Managed Care – PPO | Admitting: Psychiatry

## 2019-05-26 ENCOUNTER — Encounter (HOSPITAL_COMMUNITY): Payer: Self-pay | Admitting: Psychiatry

## 2019-05-26 DIAGNOSIS — F320 Major depressive disorder, single episode, mild: Secondary | ICD-10-CM | POA: Diagnosis not present

## 2019-05-26 MED ORDER — BUPROPION HCL ER (XL) 300 MG PO TB24: 300.0000 mg | ORAL_TABLET | Freq: Every day | ORAL | 0 refills | Status: DC

## 2019-08-17 NOTE — Progress Notes (Signed)
Virtual Visit via Telephone Note  I connected with Paul Webb on 08/23/19 at 10:20 AM EDT by telephone and verified that I am speaking with the correct person using two identifiers.   I discussed the limitations, risks, security and privacy concerns of performing an evaluation and management service by telephone and the availability of in person appointments. I also discussed with the patient that there may be a patient responsible charge related to this service. The patient expressed understanding and agreed to proceed.      I discussed the assessment and treatment plan with the patient. The patient was provided an opportunity to ask questions and all were answered. The patient agreed with the plan and demonstrated an understanding of the instructions.   The patient was advised to call back or seek an in-person evaluation if the symptoms worsen or if the condition fails to improve as anticipated.  I provided 15 minutes of non-face-to-face time during this encounter.   Norman Clay, MD    [facility] MD/PA/NP OP Progress Note  08/23/2019 10:44 AM Paul Webb  MRN:  [id number]  Chief Complaint:  Chief Complaint    Depression; Follow-up     HPI:  This is a follow-up appointment for depression.  Patient states that his wife, Paul Webb would like to be present at the interview. He is willing to have her participate in the interview.   Paul Webb states that he has not been doing well.  She states that he has been doing worse compared to the initial visit.  He stays in the bed for a week and has not done anything except some house work. Although he was able to get up when their granddaughter visited them, he does not want to go outside otherwise ("recluse"). Paul Webb denies any safety concern. When Paul Webb is asked, he thinks that it is accurate. Although there are things he needs to do, he feels fatigue and has not been able to do things. He has hypersomnia.  He snores at night.  He has fair  concentration.  He has anhedonia.  He feels depressed.  He denies SI, AH, VH.  He has fair appetite.  He denies anxiety.    Visit Diagnosis:    ICD-10-CM   1. Moderate episode of recurrent major depressive disorder ([place])  F33.1     Past Psychiatric History: Please see initial evaluation for full details. I have reviewed the history. No updates at this time.     Past Medical History:  Past Medical History:  Diagnosis Date  . Diabetes ([place])   . Fast heart beat   . GERD (gastroesophageal reflux disease)   . History of kidney stones     Past Surgical History:  Procedure Laterality Date  . COLONOSCOPY    . COLONOSCOPY N/A 10/06/2018   Procedure: COLONOSCOPY;  Surgeon: Rogene Houston, MD;  [facility];  Service: Endoscopy;  Laterality: N/A;  1:25  . FL INJ RT KNEE CT ARTHROGRAM ([facility] HX)    . POLYPECTOMY  10/06/2018   Procedure: POLYPECTOMY;  Surgeon: Rogene Houston, MD;  [facility];  Service: Endoscopy;;  colon   . Rod in lower leg     trauma  . Rt hernia repair      Family Psychiatric History: Please see initial evaluation for full details. I have reviewed the history. No updates at this time.     Family History:  Family History  Adopted: Yes    Social History:  Social History  Socioeconomic History  . Marital status: Married    Spouse name: Not on file  . Number of children: Not on file  . Years of education: Not on file  . Highest education level: Not on file  Occupational History  . Not on file  Social Needs  . Financial resource strain: Not on file  . Food insecurity    Worry: Not on file    Inability: Not on file  . Transportation needs    Medical: Not on file    Non-medical: Not on file  Tobacco Use  . Smoking status: Never Smoker  . Smokeless tobacco: Never Used  Substance and Sexual Activity  . Alcohol use: Never    Frequency: Never  . Drug use: Never  . Sexual activity: Not on file  Lifestyle  . Physical  activity    Days per week: Not on file    Minutes per session: Not on file  . Stress: Not on file  Relationships  . Social Herbalist on phone: Not on file    Gets together: Not on file    Attends religious service: Not on file    Active member of club or organization: Not on file    Attends meetings of clubs or organizations: Not on file    Relationship status: Not on file  Other Topics Concern  . Not on file  Social History Narrative  . Not on file    Allergies: No Known Allergies  Metabolic Disorder Labs: No results found for: HGBA1C, MPG No results found for: PROLACTIN No results found for: CHOL, TRIG, HDL, CHOLHDL, VLDL, LDLCALC No results found for: TSH  Therapeutic Level Labs: No results found for: LITHIUM No results found for: VALPROATE No components found for:  CBMZ  Current Medications: Current Outpatient Medications  Medication Sig Dispense Refill  . terbinafine (LAMISIL) 250 MG tablet Take 250 mg by mouth daily.    . ARIPiprazole (ABILIFY) 2 MG tablet Take 1 tablet (2 mg total) by mouth daily. 30 tablet 1  . aspirin EC 81 MG tablet Take 1 tablet (81 mg total) by mouth daily.    [name] atenolol (TENORMIN) 50 MG tablet Take 50 mg by mouth at bedtime.     Derrill Memo ON 09/27/2019] buPROPion (WELLBUTRIN XL) 300 MG 24 hr tablet Take 1 tablet (300 mg total) by mouth daily. 90 tablet 0  . cetirizine (ZYRTEC) 10 MG tablet Take 10 mg by mouth at bedtime.     . Cholecalciferol (VITAMIN D) 2000 units tablet Take 2,000 Units by mouth daily.    . fluticasone furoate-vilanterol (BREO ELLIPTA) 100-25 MCG/INH AEPB Inhale 1 puff into the lungs daily as needed (shortness of breath).    . insulin lispro (HUMALOG) 100 UNIT/ML injection Inject 2.5 Units into the skin continuous. Use in insulin pump, 2.5 units per hour    . lisinopril-hydrochlorothiazide (PRINZIDE,ZESTORETIC) 20-25 MG tablet Take 1 tablet by mouth daily.    . Omega-3 Fatty Acids (FISH OIL) 1000 MG CAPS Take 1,000  mg by mouth daily.    [name] omeprazole (PRILOSEC) 40 MG capsule Take 40 mg by mouth daily.    . pravastatin (PRAVACHOL) 80 MG tablet Take 80 mg by mouth at bedtime.     [name] venlafaxine XR (EFFEXOR-XR) 150 MG 24 hr capsule Take 150 mg by mouth daily with breakfast.    . venlafaxine XR (EFFEXOR-XR) 75 MG 24 hr capsule Take 75 mg by mouth at bedtime.     No  current facility-administered medications for this visit.      Musculoskeletal: Strength & Muscle Tone: N/A Gait & Station: N/A Patient leans: N/A  Psychiatric Specialty Exam: Review of Systems  Psychiatric/Behavioral: Positive for depression. Negative for hallucinations, memory loss, substance abuse and suicidal ideas. The patient has insomnia. The patient is not nervous/anxious.   All other systems reviewed and are negative.   There were no vitals taken for this visit.There is no height or weight on file to calculate BMI.  General Appearance: NA  Eye Contact:  NA  Speech:  Clear and Coherent  Volume:  Normal  Mood:  Depressed  Affect:  NA  Thought Process:  Coherent  Orientation:  Full (Time, Place, and Person)  Thought Content: Logical   Suicidal Thoughts:  No  Homicidal Thoughts:  No  Memory:  Immediate;   Good  Judgement:  Good  Insight:  Fair  Psychomotor Activity:  Normal  Concentration:  Concentration: Good and Attention Span: Good  Recall:  Good  Fund of Knowledge: Good  Language: Good  Akathisia:  No  Handed:  Right  AIMS (if indicated): not done  Assets:  Communication Skills Desire for Improvement  ADL's:  Intact  Cognition: WNL  Sleep:  Poor   Screenings:   Assessment and Plan:  Paul Webb is a 63 y.o. year old male with a history of depression, anxiety,diabetes, hyperlipidemia  , who presents for follow up appointment for Moderate episode of recurrent major depressive disorder ([place])  # MDD, single, moderate, recurrent without psychotic features Collateral from his wife reports worsening in depression  over the past few months, although the patient had been describing improvement in his symptoms on previous encounters.  Psychosocial stressors includes demoralization in the context of losing his job 2 years ago due to diabetes, and some marital conflict.  Will add Abilify as adjunctive treatment for depression.  Discussed potential metabolic side effect and EPS.  Will continue venlafaxine to target depression.  Will continue bupropion as adjunctive treatment for depression; may consider tapering down this medication in the future given there has been limited benefit from this medication.  Discussed behavioral activation.  Although he will greatly benefit from CBT, he would like to hold this option at this time.  We will continue to discuss as needed.   # Hypersomnia He complains of hypersomnia and snores at night.  Will make referral again for sleep clinic.   Plan I have reviewed and updated plans as below 1. Continue venlafaxine 225 mg daily  2.Continuebupropion 300 mg daily(limited benefit from uptitration of 150 to 300 mg ) 3. Start Abilfy 2 mg daily   4.Next appointment: 11/11 at 10 AM for 20 mins, phone - he declined therapy follow up - TSH checked according to his wife a few weeks ago; wnl  Past trials of medication: ?lexapro, venlafaxine,   The patient demonstrates the following risk factors for suicide: Chronic risk factors for suicide include:psychiatric disorder ofdepression. Acute risk factorsfor suicide include: family or marital conflict and loss (financial, interpersonal, professional). Protective factorsfor this patient include: coping skills and hope for the future. Considering these factors, the overall suicide risk at this point appears to below. Patientisappropriate for outpatient follow up.  Norman Clay, MD 08/23/2019, 10:44 AM

## 2019-08-23 ENCOUNTER — Ambulatory Visit (INDEPENDENT_AMBULATORY_CARE_PROVIDER_SITE_OTHER): Payer: Medicare HMO | Admitting: Psychiatry

## 2019-08-23 ENCOUNTER — Other Ambulatory Visit: Payer: Self-pay

## 2019-08-23 ENCOUNTER — Telehealth (HOSPITAL_COMMUNITY): Payer: Self-pay | Admitting: Psychiatry

## 2019-08-23 ENCOUNTER — Encounter (HOSPITAL_COMMUNITY): Payer: Self-pay | Admitting: Psychiatry

## 2019-08-23 DIAGNOSIS — F331 Major depressive disorder, recurrent, moderate: Secondary | ICD-10-CM

## 2019-08-23 MED ORDER — ARIPIPRAZOLE 2 MG PO TABS: 2.0000 mg | ORAL_TABLET | Freq: Every day | ORAL | 1 refills | Status: DC

## 2019-08-23 MED ORDER — BUPROPION HCL ER (XL) 300 MG PO TB24: 300.0000 mg | ORAL_TABLET | Freq: Every day | ORAL | 0 refills | Status: DC

## 2019-08-23 NOTE — Patient Instructions (Signed)
1. Continue venlafaxine 225 mg daily  2.Continuebupropion 300 mg daily 3. Start Abilfy 2 mg daily   4.Next appointment: 11/11 at 10 AM

## 2019-08-23 NOTE — Telephone Encounter (Signed)
Spoke with the [place] [facility] & they will call patient again to make appointment. Spoke with the patient's wife & gave # to [place] [facility] so that she can also call to make appt.

## 2019-08-23 NOTE — Telephone Encounter (Signed)
I referred him to neurology for sleep apnea, and he no showed. Could you ask the clinic if he is still eligible to be seen? If so, it would be great if you could help so that he can have an appointment. Thanks.

## 2019-09-05 ENCOUNTER — Encounter: Payer: Self-pay | Admitting: Neurology

## 2019-09-05 ENCOUNTER — Other Ambulatory Visit: Payer: Self-pay

## 2019-09-05 ENCOUNTER — Ambulatory Visit (INDEPENDENT_AMBULATORY_CARE_PROVIDER_SITE_OTHER): Payer: Medicare HMO | Admitting: Neurology

## 2019-09-05 VITALS — BP 131/87 | HR 70 | Temp 97.6°F | Ht 71.0 in | Wt 224.0 lb

## 2019-09-05 DIAGNOSIS — G4733 Obstructive sleep apnea (adult) (pediatric): Secondary | ICD-10-CM | POA: Insufficient documentation

## 2019-09-05 DIAGNOSIS — R0683 Snoring: Secondary | ICD-10-CM | POA: Diagnosis not present

## 2019-09-05 DIAGNOSIS — G4711 Idiopathic hypersomnia with long sleep time: Secondary | ICD-10-CM | POA: Insufficient documentation

## 2019-09-05 DIAGNOSIS — Z72821 Inadequate sleep hygiene: Secondary | ICD-10-CM | POA: Insufficient documentation

## 2019-09-05 DIAGNOSIS — G4720 Circadian rhythm sleep disorder, unspecified type: Secondary | ICD-10-CM

## 2019-09-05 DIAGNOSIS — E669 Obesity, unspecified: Secondary | ICD-10-CM | POA: Insufficient documentation

## 2019-09-05 DIAGNOSIS — E66811 Obesity, class 1: Secondary | ICD-10-CM

## 2019-09-05 HISTORY — DX: Idiopathic hypersomnia with long sleep time: G47.11

## 2019-09-05 HISTORY — DX: Obesity, class 1: E66.811

## 2019-09-05 HISTORY — DX: Circadian rhythm sleep disorder, unspecified type: G47.20

## 2019-09-05 HISTORY — DX: Obesity, unspecified: E66.9

## 2019-09-05 NOTE — Patient Instructions (Signed)

## 2019-09-05 NOTE — Progress Notes (Signed)
Paul Webb    SLEEP MEDICINE CLINIC    Provider:  Larey Seat, MD  Primary Care Physician:  Glenda Chroman, MD [place] [address]     Referring Provider: Garfield Cornea, MD         Chief Complaint according to patient   Patient presents with:    . New Patient (Initial Visit)     No show for 05-10-2019 visit with Dr. Rexene Alberts.       HISTORY OF PRESENT ILLNESS:  Paul Webb is a 63 y.o. year old  male patient seen here on 09/05/2019 from [place]., his psychiatrist. He has had a telephone conversation only on 04-27-2019.    Chief concern according to patient : Hypersomnia, staying in bed all day, not sleeping through the night.    I have the pleasure of seeing Paul Webb today, a right handed Caucasian male with a possible sleep disorder. He   has a past medical history of Diabetes ([place]), Fast heart beat, GERD (gastroesophageal reflux disease), and History of kidney stones.Paul Kitchenand major depression and anxiety. Diagnosed at age 41.    Sleep relevant medical history: Nocturia - one , Sleep walking no but sleep talking, vivid dreams. No tonsillectomy.   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 and lives in a household with his spouse, he is married status , with 4 adult children,.  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  Pets:  A cat is present. Tobacco use never .   ETOH use none ,  Caffeine intake in form of Coffee( quit 5 month ago ) Soda( caffeine mountain dew ) Tea ( none) or energy drinks. Regular exercise; none .   Hobbies ; yard work.     Sleep habits are as follows: The patient's dinner time is between 6-7  PM.  The patient goes to bed at 12 midnight - asleep within 1 hour  , has racing thoughts- once asleep he continues to sleep for 3 hours, wakes for one bathroom break. The bedroom is cool, quiet and dark.  The preferred sleep position is  laterally , right preferred. , with the support of 2 pillows.  Dreams are reportedly frequent/vivid.  The patient wakes up spontaneously around noon.  10-12 AM is the usual rise time. No alarm set (!), sleep time 3-4 at night and 3-4 in daytime. He is bed for most of the day .  He reports not feeling refreshed or restored in AM, with symptoms such as dry mouth all the time , rarely morning headaches , and residual fatigue.  Naps are taken frequently, lasting from 30-60  minutes and are not more refreshing than nocturnal sleep.    Review of Systems: Out of a complete 14 system review, the patient complains of only the following symptoms, and all other reviewed systems are negative.:  Fatigue, sleepiness , snoring, fragmented sleep,   How likely are you to doze in the following situations: 0 = not likely, 1 = slight chance, 2 = moderate chance, 3 = high chance   Sitting and Reading? Watching Television? Sitting inactive in a public place (theater or meeting)? As a passenger in a car for an hour without a break? Lying down in the afternoon when circumstances permit? Sitting and talking to someone? Sitting quietly after lunch without alcohol? In a car, while stopped for a  few minutes in traffic?   Total = 10/ 24 points   FSS endorsed at 62/ 63 points.  High fatigue, no sleep attacks. He nests in his bedroom, TV is on all the time- he lets his wife bring food to the bedroom.  Racing thoughts.     Social History   Socioeconomic History  . Marital status: Married    Spouse name: Not on file  . Number of children: Not on file  . Years of education: Not on file  . Highest education level: Not on file  Occupational History  . Not on file  Social Needs  . Financial resource strain: Not on file  . Food insecurity    Worry: Not on file    Inability: Not on file  . Transportation needs    Medical: Not on file    Non-medical: Not on file  Tobacco Use  . Smoking status: Never Smoker   . Smokeless tobacco: Never Used  Substance and Sexual Activity  . Alcohol use: Never    Frequency: Never  . Drug use: Never  . Sexual activity: Not on file  Lifestyle  . Physical activity    Days per week: Not on file    Minutes per session: Not on file  . Stress: Not on file  Relationships  . Social Herbalist on phone: Not on file    Gets together: Not on file    Attends religious service: Not on file    Active member of club or organization: Not on file    Attends meetings of clubs or organizations: Not on file    Relationship status: Not on file  Other Topics Concern  . Not on file  Social History Narrative  . Not on file    Family History  Adopted: Yes    Past Medical History:  Diagnosis Date  . Diabetes ([place])   . Fast heart beat   . GERD (gastroesophageal reflux disease)   . History of kidney stones     Past Surgical History:  Procedure Laterality Date  . COLONOSCOPY    . COLONOSCOPY N/A 10/06/2018   Procedure: COLONOSCOPY;  Surgeon: Rogene Houston, MD;  [facility];  Service: Endoscopy;  Laterality: N/A;  1:25  . FL INJ RT KNEE CT ARTHROGRAM ([facility] HX)    . POLYPECTOMY  10/06/2018   Procedure: POLYPECTOMY;  Surgeon: Rogene Houston, MD;  Location: [facility];  Service: Endoscopy;;  colon   . Rod in lower leg     trauma  . Rt hernia repair       Current Outpatient Medications on File Prior to Visit  Medication Sig Dispense Refill  . ARIPiprazole (ABILIFY) 2 MG tablet Take 1 tablet (2 mg total) by mouth daily. 30 tablet 1  . aspirin EC 81 MG tablet Take 1 tablet (81 mg total) by mouth daily.    Paul Webb atenolol (TENORMIN) 50 MG tablet Take 50 mg by mouth at bedtime.     Derrill Memo ON 09/27/2019] buPROPion (WELLBUTRIN XL) 300 MG 24 hr tablet Take 1 tablet (300 mg total) by mouth daily. 90 tablet 0  . cetirizine (ZYRTEC) 10 MG tablet Take 10 mg by mouth at bedtime.     . Cholecalciferol (VITAMIN D) 2000 units tablet Take 2,000 Units by  mouth daily.    . fluticasone furoate-vilanterol (BREO ELLIPTA) 100-25 MCG/INH AEPB Inhale 1 puff into the lungs daily as needed (shortness of breath).    Paul Webb  insulin lispro (HUMALOG) 100 UNIT/ML injection Inject 2.5 Units into the skin continuous. Use in insulin pump, 2.5 units per hour    . lisinopril-hydrochlorothiazide (PRINZIDE,ZESTORETIC) 20-25 MG tablet Take 1 tablet by mouth daily.    . Omega-3 Fatty Acids (FISH OIL) 1000 MG CAPS Take 1,000 mg by mouth daily.    Paul Webb omeprazole (PRILOSEC) 40 MG capsule Take 40 mg by mouth daily.    . pravastatin (PRAVACHOL) 80 MG tablet Take 80 mg by mouth at bedtime.     . terbinafine (LAMISIL) 250 MG tablet Take 250 mg by mouth daily.    Paul Webb venlafaxine XR (EFFEXOR-XR) 150 MG 24 hr capsule Take 150 mg by mouth daily with breakfast.    . venlafaxine XR (EFFEXOR-XR) 75 MG 24 hr capsule Take 75 mg by mouth at bedtime.     No current facility-administered medications on file prior to visit.     Physical exam:  Today's Vitals   09/05/19 1527  BP: 131/87  Pulse: 70  Temp: 97.6 F (36.4 C)  Weight: 224 lb (101.6 kg)  Height: 5\' 11"  (1.803 m)   Body mass index is 31.24 kg/m.   Wt Readings from Last 3 Encounters:  09/05/19 224 lb (101.6 kg)  10/26/18 223 lb (101.2 kg)  10/06/18 217 lb (98.4 kg)     Ht Readings from Last 3 Encounters:  09/05/19 5\' 11"  (1.803 m)  10/26/18 5\' 11"  (1.803 m)  10/06/18 5\' 11"  (1.803 m)      General: The patient is awake, alert and appears not in acute distress. The patient is well groomed. Head: Normocephalic, atraumatic. Neck is supple. Mallampati 3- a lot of soft tissue.  neck circumference:*16.5 inches . Nasal airflow patent.  Retrognathia is seen.  Dental status: crowded.  Cardiovascular:  Regular rate and cardiac rhythm by pulse,  without distended neck veins. Respiratory: Lungs are clear to auscultation.  Skin:  Without evidence of ankle edema, or rash. Trunk: The patient's posture is erect.   Neurologic  exam : The patient is awake and alert, oriented to place and time.   Memory subjective described as intact.  Attention span & concentration ability appears normal.  Speech is fluent,  without  dysarthria, dysphonia or aphasia.  Mood and affect are appropriate.   Cranial nerves: no loss of smell or taste reported  Pupils are equal and briskly reactive to light. Funduscopic exam deferred. .  Extraocular movements in vertical and horizontal planes were intact and without nystagmus. No Diplopia. Visual fields by finger perimetry are intact. Hearing was intact to soft voice and finger rubbing.   Facial sensation intact to fine touch. Facial motor strength is symmetric and tongue and uvula move midline. Neck ROM : rotation, tilt and flexion extension were normal for age and shoulder shrug was symmetrical.    Motor exam:  Symmetric bulk, tone and ROM.   Normal tone without cog wheeling, symmetric grip strength .   Sensory:  Fine touch, pinprick and vibration were normal in the upper extremities. .  Proprioception tested in the upper extremities was normal. Coordination: Rapid alternating movements in the fingers/hands were of normal speed.  The Finger-to-nose maneuver was intact without evidence of ataxia, dysmetria or tremor.   Gait and station: Patient could rise unassisted from a seated position, walked without assistive device.  Toe and heel walk were deferred.  Deep tendon reflexes: in the  upper and lower extremities are symmetric and intact.  Babinski response was deferred .  After spending a total time of  35  minutes face to face and additional time for physical and neurologic examination, review of laboratory studies,  personal review of imaging studies, reports and results of other testing and review of referral information / records as far as provided in visit, I have established the following assessments:  1) There is a very variable sleep pattern here, no routines, rules  and Tv in bedroom. This fits a person with anxiety and depression.  I addressed sleep hygiene and gave the boot camp instruction.   2) There is a higher BMI, large neck and retrognathia, all risk factors for OSA, he snores.    3) His insomnia/hypersomnia  is mainly a product of poor habits and depression.    My Plan is to proceed with:  1) HST ( [employer] medicare will l not permit me to use an attended sleep study).  implementing sleep hygiene.  I would like to thank Glenda Chroman, MD and Dr. Modesta Messing [address] [place],  [place] [address] for allowing me to meet with and to take care of this pleasant patient.   In short, Paul Webb is presenting with hypersomnia without short sleep time and snoring , I plan to follow up either personally or through our NP within 2-3  month.   CC: I will share my notes with  PCP   Electronically signed by: Larey Seat, MD 09/05/2019 3:35 PM  [facility] [place] certified by The [employer] of Sleep Medicine and Diplomate of the [employer] of Sleep Medicine. Board certified In Neurology through the [place], Fellow of the [employer] of Neurology. Medical Director of [employer].

## 2019-09-14 ENCOUNTER — Ambulatory Visit (INDEPENDENT_AMBULATORY_CARE_PROVIDER_SITE_OTHER): Payer: Medicare HMO | Admitting: Neurology

## 2019-09-14 DIAGNOSIS — R0683 Snoring: Secondary | ICD-10-CM

## 2019-09-14 DIAGNOSIS — G4720 Circadian rhythm sleep disorder, unspecified type: Secondary | ICD-10-CM

## 2019-09-14 DIAGNOSIS — Z72821 Inadequate sleep hygiene: Secondary | ICD-10-CM

## 2019-09-14 DIAGNOSIS — E66811 Obesity, class 1: Secondary | ICD-10-CM

## 2019-09-14 DIAGNOSIS — G4711 Idiopathic hypersomnia with long sleep time: Secondary | ICD-10-CM

## 2019-09-14 DIAGNOSIS — G4733 Obstructive sleep apnea (adult) (pediatric): Secondary | ICD-10-CM | POA: Diagnosis not present

## 2019-09-14 DIAGNOSIS — E669 Obesity, unspecified: Secondary | ICD-10-CM

## 2019-09-23 NOTE — Addendum Note (Signed)
Addended by: Larey Seat on: 09/23/2019 01:48 PM   Modules accepted: Orders

## 2019-09-23 NOTE — Procedures (Signed)
First Name: Paul Last Name: Webb ID: [id number]  Birth Date: 02-Aug-1956 Age: 63 Gender: Male  Referring Provider: Garfield Cornea, MD Glenda Chroman, MD BMI: 31.5 (W=225 lb, H=5' 11'')  Neck Circ.:  16 '' Epworth:  10/24   Sleep Study Information    Study Date: Sep 14, 2019 S/H/A Version: 001.001.001.001 / 4.1.1528 / 77  History:    Paul Webb is a right- handed 63 year old Caucasian male with a medical history of insulin dependent Diabetes mellitus ([place]), palpitations, GERD (gastroesophageal reflux disease), and kidney stones, also major depression and anxiety, diagnosed at age 64. Referred by Dr. Modesta Messing after telephone call 04-27-2019, seen here face to face 09-05-2019. He reports nocturia and sleep talking. Sleeps 3-4 hours at night and another 3-4 hours in daytime. Former Administrator. Here to rule out organic sleep disorder.             Summary & Diagnosis:    SEVERE SLEEP APNEA at AHI of 46.8/h and without accentuation in REM sleep. The NREM AHI was higher than REM at 49.6/h, indicating central or complex apneas without major oxygen desaturation. The patient had more apnea in supine sleep position (AHI 51/h).   Recommendations:     The patient is to return for an attended titration study given the nature of his sleep apnea being complex and likely central.  If insurance is not permitting this step, will need to use autotitration and ONO confirmation of therapeutic success.  Physician Name: Larey Seat, MD             Sleep Summary    Oxygen Saturation Statistics     Start Study Time: End Study Time: Total Recording Time:    11:50:12 PM    9:08:44 AM     9 h, 18 min  Total Sleep Time % REM of Sleep Time:  7 h, 18 min 11.8    Mean: 95 Minimum: 77 Maximum: 100  Mean of Desaturations Nadirs (%):   92  Oxygen Desaturation. %:   4-9 10-20 >20 Total  Events Number Total   242  5 97.6 2.0  1 0.4  248 100.0  Oxygen Saturation: <90 <=88 <85 <80 <70  Duration  (minutes): Sleep % 2.4 0.5  1.1 0.3  0.3 0.1 0.1 0.0 0.0 0.0     Respiratory Indices      Total Events REM NREM All Night  pRDI:  349  pAHI:  338 ODI:  248 pAHIc: 90 % CSR:  26.1 24.9 15.4 3.6 51.2 49.6 36.8 13.6 48.3 46.8 34.3 12.5       Pulse Rate Statistics during Sleep (BPM)      Mean:  64 Minimum: 50 Maximum:  88    Indices are calculated using technically valid sleep time of 7 h, 13 min. pRDI/ pAHI are calculated using 02 desaturations ? 3%  Body Position Statistics  Position Supine Prone Right Left Non-Supine  Sleep (min) 136.5 0.0 189.0 112.5 301.5  Sleep % 31.2 0.0 43.1 25.7 68.8  pRDI 62.5 N/A 33.3 56.3 41.8  pAHI 61.1 N/A 30.7 56.3 40.2  ODI 51.8 N/A 13.8 47.6 26.3     Snoring Statistics Snoring Level (dB) >40 >50 >60 >70 >80 >Threshold (45)  Sleep (min) 171.5 18.6 2.7 0.6 0.0 47.8  Sleep % 39.1 4.2 0.6 0.1 0.0 10.9    Mean: 42 dB

## 2019-09-26 ENCOUNTER — Telehealth: Payer: Self-pay | Admitting: Neurology

## 2019-09-26 ENCOUNTER — Telehealth: Payer: Self-pay

## 2019-09-26 ENCOUNTER — Other Ambulatory Visit: Payer: Self-pay | Admitting: Neurology

## 2019-09-26 DIAGNOSIS — G4720 Circadian rhythm sleep disorder, unspecified type: Secondary | ICD-10-CM

## 2019-09-26 DIAGNOSIS — G4711 Idiopathic hypersomnia with long sleep time: Secondary | ICD-10-CM

## 2019-09-26 DIAGNOSIS — E669 Obesity, unspecified: Secondary | ICD-10-CM

## 2019-09-26 DIAGNOSIS — R0683 Snoring: Secondary | ICD-10-CM

## 2019-09-26 DIAGNOSIS — Z72821 Inadequate sleep hygiene: Secondary | ICD-10-CM

## 2019-09-26 DIAGNOSIS — G4733 Obstructive sleep apnea (adult) (pediatric): Secondary | ICD-10-CM

## 2019-09-26 DIAGNOSIS — E66811 Obesity, class 1: Secondary | ICD-10-CM

## 2019-09-26 NOTE — Telephone Encounter (Signed)
I called pt and spoke with his wife. I advised pt that Dr. Brett Fairy reviewed their sleep study results and found that pt severe sleep apnea. Dr. Brett Fairy recommends that pt starts auto CPAP. I reviewed PAP compliance expectations with the pt. Pt is agreeable to starting a CPAP. I advised pt that an order will be sent to a DME, [facility], and [facility] will call the pt within about one week after they file with the pt's insurance. [facility] will show the pt how to use the machine, fit for masks, and troubleshoot the CPAP if needed. A follow up appt was made for insurance purposes with Ward Givens, NP on Jan 7,2020 at 11:30 am. Pt verbalized understanding to arrive 15 minutes early and bring their CPAP. A letter with all of this information in it will be mailed to the pt as a reminder. I verified with the pt that the address we have on file is correct. Pt verbalized understanding of results. Pt had no questions at this time but was encouraged to call back if questions arise. I have sent the order to [facility] and have received confirmation that they have received the order.

## 2019-09-26 NOTE — Telephone Encounter (Signed)
[employer] medicare will not cover a CPAP titration. There was not enough cental apnea to get approved. He will need an auto titration.

## 2019-09-26 NOTE — Telephone Encounter (Signed)
-----   Message from Larey Seat, MD sent at 09/23/2019  1:48 PM EDT ----- Summary & Diagnosis:   SEVERE SLEEP APNEA at AHI of 46.8/h and without accentuation in  REM sleep. The NREM AHI was higher than REM at 49.6/h, indicating  central or complex apneas without major oxygen desaturation. The  patient had more apnea in supine sleep position (AHI 51/h).   Recommendations:    The patient is to return for an attended titration study given  the nature of his sleep apnea being complex and likely central.  If insurance is not permitting this step, will need to use  autotitration and ONO confirmation of therapeutic success.  Physician Name: Larey Seat, MD

## 2019-09-28 NOTE — Progress Notes (Signed)
Virtual Visit via Telephone Note  I connected with Paul Webb on 10/05/19 at 10:00 AM EST by telephone and verified that I am speaking with the correct person using two identifiers.   I discussed the limitations, risks, security and privacy concerns of performing an evaluation and management service by telephone and the availability of in person appointments. I also discussed with the patient that there may be a patient responsible charge related to this service. The patient expressed understanding and agreed to proceed.      I discussed the assessment and treatment plan with the patient. The patient was provided an opportunity to ask questions and all were answered. The patient agreed with the plan and demonstrated an understanding of the instructions.   The patient was advised to call back or seek an in-person evaluation if the symptoms worsen or if the condition fails to improve as anticipated.  I provided 13 minutes of non-face-to-face time during this encounter.   Norman Clay, MD    [facility] MD/PA/NP OP Progress Note  10/05/2019 10:27 AM Paul Webb  MRN:  [id number]  Chief Complaint:  Chief Complaint    Depression; Follow-up     HPI:  - Sleep study:  central or complex apneas without major oxygen desaturation  This is a follow-up appointment for depression.  He states that he was able to stay in the sleep clinic.  He has been having trouble getting used to the CPAP machine.  He states that he has been trying to make himself busy as he usually feels better when he is doing something.  He has started to enjoy working on cars, which he has not been able to do for a while.  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 He thinks that he has been doing better since starting Abilify. He has insomnia.  He denies feeling depressed.  He has more motivation and energy.  He has fair concentration.  He has good appetite.  He denies any significant  weight change.  He denies SI.  He feels anxious/tense during the day, although he cannot think of any reason. He had a panic attack in the middle of the night. He feels irritable at times without any triggers.    Visit Diagnosis:    ICD-10-CM   1. Mild episode of recurrent major depressive disorder ([place])  F33.0     Past Psychiatric History: Please see initial evaluation for full details. I have reviewed the history. No updates at this time.     Past Medical History:  Past Medical History:  Diagnosis Date  . Diabetes ([place])   . Fast heart beat   . GERD (gastroesophageal reflux disease)   . History of kidney stones     Past Surgical History:  Procedure Laterality Date  . COLONOSCOPY    . COLONOSCOPY N/A 10/06/2018   Procedure: COLONOSCOPY;  Surgeon: Rogene Houston, MD;  [facility];  Service: Endoscopy;  Laterality: N/A;  1:25  . FL INJ RT KNEE CT ARTHROGRAM ([facility] HX)    . POLYPECTOMY  10/06/2018   Procedure: POLYPECTOMY;  Surgeon: Rogene Houston, MD;  [facility];  Service: Endoscopy;;  colon   . Rod in lower leg     trauma  . Rt hernia repair      Family Psychiatric History: Please see initial evaluation for full details. I have reviewed the history. No updates at this time.     Family  History:  Family History  Adopted: Yes    Social History:  Social History   Socioeconomic History  . Marital status: Married    Spouse name: Not on file  . Number of children: Not on file  . Years of education: Not on file  . Highest education level: Not on file  Occupational History  . Not on file  Social Needs  . Financial resource strain: Not on file  . Food insecurity    Worry: Not on file    Inability: Not on file  . Transportation needs    Medical: Not on file    Non-medical: Not on file  Tobacco Use  . Smoking status: Never Smoker  . Smokeless tobacco: Never Used  Substance and Sexual Activity  . Alcohol use: Never    Frequency: Never   . Drug use: Never  . Sexual activity: Not on file  Lifestyle  . Physical activity    Days per week: Not on file    Minutes per session: Not on file  . Stress: Not on file  Relationships  . Social Herbalist on phone: Not on file    Gets together: Not on file    Attends religious service: Not on file    Active member of club or organization: Not on file    Attends meetings of clubs or organizations: Not on file    Relationship status: Not on file  Other Topics Concern  . Not on file  Social History Narrative  . Not on file    Allergies: No Known Allergies  Metabolic Disorder Labs: No results found for: HGBA1C, MPG No results found for: PROLACTIN No results found for: CHOL, TRIG, HDL, CHOLHDL, VLDL, LDLCALC No results found for: TSH  Therapeutic Level Labs: No results found for: LITHIUM No results found for: VALPROATE No components found for:  CBMZ  Current Medications: Current Outpatient Medications  Medication Sig Dispense Refill  . [START ON 10/20/2019] ARIPiprazole (ABILIFY) 2 MG tablet Take 1 tablet (2 mg total) by mouth daily. 90 tablet 1  . aspirin EC 81 MG tablet Take 1 tablet (81 mg total) by mouth daily.    [name] atenolol (TENORMIN) 50 MG tablet Take 50 mg by mouth at bedtime.     [name] buPROPion (WELLBUTRIN XL) 300 MG 24 hr tablet Take 1 tablet (300 mg total) by mouth daily. 90 tablet 0  . [START ON 12/26/2019] buPROPion (WELLBUTRIN XL) 300 MG 24 hr tablet Take 1 tablet (300 mg total) by mouth daily. 90 tablet 0  . cetirizine (ZYRTEC) 10 MG tablet Take 10 mg by mouth at bedtime.     . Cholecalciferol (VITAMIN D) 2000 units tablet Take 2,000 Units by mouth daily.    . fluticasone furoate-vilanterol (BREO ELLIPTA) 100-25 MCG/INH AEPB Inhale 1 puff into the lungs daily as needed (shortness of breath).    . insulin lispro (HUMALOG) 100 UNIT/ML injection Inject 2.5 Units into the skin continuous. Use in insulin pump, 2.5 units per hour    .  lisinopril-hydrochlorothiazide (PRINZIDE,ZESTORETIC) 20-25 MG tablet Take 1 tablet by mouth daily.    . Omega-3 Fatty Acids (FISH OIL) 1000 MG CAPS Take 1,000 mg by mouth daily.    [name] omeprazole (PRILOSEC) 40 MG capsule Take 40 mg by mouth daily.    . pravastatin (PRAVACHOL) 80 MG tablet Take 80 mg by mouth at bedtime.     . terbinafine (LAMISIL) 250 MG tablet Take 250 mg by mouth daily.    [name]  venlafaxine XR (EFFEXOR-XR) 150 MG 24 hr capsule Take 150 mg by mouth daily with breakfast.    . venlafaxine XR (EFFEXOR-XR) 75 MG 24 hr capsule Take 75 mg by mouth at bedtime.     No current facility-administered medications for this visit.      Musculoskeletal: Strength & Muscle Tone: N/A Gait & Station: N/A Patient leans: N/A  Psychiatric Specialty Exam: Review of Systems  Psychiatric/Behavioral: Negative for depression, hallucinations, memory loss, substance abuse and suicidal ideas. The patient is nervous/anxious and has insomnia.   All other systems reviewed and are negative.   There were no vitals taken for this visit.There is no height or weight on file to calculate BMI.  General Appearance: NA  Eye Contact:  NA  Speech:  Clear and Coherent  Volume:  Normal  Mood:  Anxious  Affect:  NA  Thought Process:  Coherent  Orientation:  Full (Time, Place, and Person)  Thought Content: Logical   Suicidal Thoughts:  No  Homicidal Thoughts:  No  Memory:  Immediate;   Good  Judgement:  Good  Insight:  Good  Psychomotor Activity:  Normal  Concentration:  Concentration: Good and Attention Span: Good  Recall:  Good  Fund of Knowledge: Good  Language: Good  Akathisia:  No  Handed:  Right  AIMS (if indicated): not done  Assets:  Communication Skills Desire for Improvement  ADL's:  Intact  Cognition: WNL  Sleep:  Poor   Screenings:   Assessment and Plan:  Paul Webb is a 63 y.o. year old male with a history of depression, anxiety,diabetes, hyperlipidemia. Sleep apnea , who presents  for follow up appointment for Mild episode of recurrent major depressive disorder ([place])  # MDD, single, mild, recurrent without psychotic features There has been overall improvement in depressive symptoms after starting Abilify.  Psychosocial stressors includes an organization in the context of losing his job 2 years ago due to diabetes.  Will continue venlafaxine to target depression.  Will continue bupropion as adjunctive treatment for depression.  Will consider tapering down this medication in the future if he denies any significant mood symptoms given he reports limited benefit from up titration to the current dose.  We will continue Abilify as adjunctive treatment for depression.  Discussed potential risk of EPS and metabolic side effect.  Discussed behavioral activation.   # Sleep apnea Referred to sleep clinic. He was diagnosed with sleep apnea, and has started to use/adjust CPAP machine.   Plan I have reviewed and updated plans as below  1. Continue venlafaxine 225 mg daily (he declined refill) 2.Continuebupropion 300 mg daily(limited benefit from uptitration of 150 to 300 mg ) 3. Continue Abilfy 2 mg daily   4.Next appointment: in 3 months   - he declined therapy follow up - TSH checked according to his wife a few weeks ago; wnl  Past trials of medication:?lexapro, venlafaxine,  The patient demonstrates the following risk factors for suicide: Chronic risk factors for suicide include:psychiatric disorder ofdepression. Acute risk factorsfor suicide include: family or marital conflict and loss (financial, interpersonal, professional). Protective factorsfor this patient include: coping skills and hope for the future. Considering these factors, the overall suicide risk at this point appears to below. Patientisappropriate for outpatient follow up.  Norman Clay, MD 10/05/2019, 10:27 AM

## 2019-10-05 ENCOUNTER — Encounter (HOSPITAL_COMMUNITY): Payer: Self-pay | Admitting: Psychiatry

## 2019-10-05 ENCOUNTER — Ambulatory Visit (INDEPENDENT_AMBULATORY_CARE_PROVIDER_SITE_OTHER): Payer: Medicare HMO | Admitting: Psychiatry

## 2019-10-05 ENCOUNTER — Other Ambulatory Visit: Payer: Self-pay

## 2019-10-05 DIAGNOSIS — F330 Major depressive disorder, recurrent, mild: Secondary | ICD-10-CM

## 2019-10-05 MED ORDER — BUPROPION HCL ER (XL) 300 MG PO TB24: 300.0000 mg | ORAL_TABLET | Freq: Every day | ORAL | 0 refills | Status: DC

## 2019-10-05 MED ORDER — ARIPIPRAZOLE 2 MG PO TABS: 2.0000 mg | ORAL_TABLET | Freq: Every day | ORAL | 1 refills | Status: DC

## 2019-12-01 ENCOUNTER — Ambulatory Visit: Payer: Self-pay | Admitting: Adult Health

## 2019-12-27 ENCOUNTER — Ambulatory Visit: Payer: Medicare HMO | Admitting: Psychiatry

## 2020-01-09 ENCOUNTER — Telehealth (HOSPITAL_COMMUNITY): Payer: Self-pay | Admitting: Psychiatry

## 2020-01-09 NOTE — Telephone Encounter (Signed)
Received a letter from the [facility] regarding CPAP machine/mask, which causes him significant anxiety. Will address this issues at his next visit.

## 2020-01-13 NOTE — Progress Notes (Signed)
Virtual Visit via Telephone Note  I connected with Paul Webb on 01/16/20 at  8:30 AM EST by telephone and verified that I am speaking with the correct person using two identifiers.   I discussed the limitations, risks, security and privacy concerns of performing an evaluation and management service by telephone and the availability of in person appointments. I also discussed with the patient that there may be a patient responsible charge related to this service. The patient expressed understanding and agreed to proceed.       I discussed the assessment and treatment plan with the patient. The patient was provided an opportunity to ask questions and all were answered. The patient agreed with the plan and demonstrated an understanding of the instructions.   The patient was advised to call back or seek an in-person evaluation if the symptoms worsen or if the condition fails to improve as anticipated.  I provided 21 minutes of non-face-to-face time during this encounter.   Norman Clay, MD    [facility] MD/PA/NP OP Progress Note  01/16/2020 9:03 AM Paul Webb  MRN:  [id number]  Chief Complaint:  Chief Complaint    Anxiety; Follow-up; Depression     HPI:  This is a follow-up of appointment for depression and anxiety.  He states that he has not been able to wear a mask due to claustrophobic.  He believes his anxiety has been getting worse.  He has insomnia secondary to panic attacks.  The panic attacks can happen at any occasion, although it is usually worse at night.  Denies any specific thoughts attributing to this.  He spends time doing house chores.  He may occasionally spend time with his wife when she is not busy. He feels depressed.  Feels fatigue.  He has fair concentration.  He has good appetite.  He denies SI.  He feels tense.  He denies irritability.   225 lbs Wt Readings from Last 3 Encounters:  09/05/19 224 lb (101.6 kg)  02/16/19 225 lb (102.1 kg)  10/26/18 223 lb (101.2  kg)    Visit Diagnosis:    ICD-10-CM   1. Mild episode of recurrent major depressive disorder (H[facility])  F33.0   2. Sleep apnea, unspecified type  G47.30     Past Psychiatric History:  Please see initial evaluation for full details. I have reviewed the history. No updates at this time.     Past Medical History:  Past Medical History:  Diagnosis Date  . Diabetes ([place])   . Fast heart beat   . GERD (gastroesophageal reflux disease)   . History of kidney stones     Past Surgical History:  Procedure Laterality Date  . COLONOSCOPY    . COLONOSCOPY N/A 10/06/2018   Procedure: COLONOSCOPY;  Surgeon: Rogene Houston, MD;  [facility];  Service: Endoscopy;  Laterality: N/A;  1:25  . FL INJ RT KNEE CT ARTHROGRAM ([facility] HX)    . POLYPECTOMY  10/06/2018   Procedure: POLYPECTOMY;  Surgeon: Rogene Houston, MD;  [facility];  Service: Endoscopy;;  colon   . Rod in lower leg     trauma  . Rt hernia repair      Family Psychiatric History: Please see initial evaluation for full details. I have reviewed the history. No updates at this time.     Family History:  Family History  Adopted: Yes    Social History:  Social History   Socioeconomic History  . Marital status: Married  Spouse name: Not on file  . Number of children: Not on file  . Years of education: Not on file  . Highest education level: Not on file  Occupational History  . Not on file  Tobacco Use  . Smoking status: Never Smoker  . Smokeless tobacco: Never Used  Substance and Sexual Activity  . Alcohol use: Never  . Drug use: Never  . Sexual activity: Not on file  Other Topics Concern  . Not on file  Social History Narrative  . Not on file   Social Determinants of Health   Financial Resource Strain:   . Difficulty of Paying Living Expenses: Not on file  Food Insecurity:   . Worried About Charity fundraiser in the Last Year: Not on file  . Ran Out of Food in the Last Year: Not on  file  Transportation Needs:   . Lack of Transportation (Medical): Not on file  . Lack of Transportation (Non-Medical): Not on file  Physical Activity:   . Days of Exercise per Week: Not on file  . Minutes of Exercise per Session: Not on file  Stress:   . Feeling of Stress : Not on file  Social Connections:   . Frequency of Communication with Friends and Family: Not on file  . Frequency of Social Gatherings with Friends and Family: Not on file  . Attends Religious Services: Not on file  . Active Member of Clubs or Organizations: Not on file  . Attends Archivist Meetings: Not on file  . Marital Status: Not on file    Allergies: No Known Allergies  Metabolic Disorder Labs: No results found for: HGBA1C, MPG No results found for: PROLACTIN No results found for: CHOL, TRIG, HDL, CHOLHDL, VLDL, LDLCALC No results found for: TSH  Therapeutic Level Labs: No results found for: LITHIUM No results found for: VALPROATE No components found for:  CBMZ  Current Medications: Current Outpatient Medications  Medication Sig Dispense Refill  . ARIPiprazole (ABILIFY) 2 MG tablet Take 1 tablet (2 mg total) by mouth daily. 90 tablet 1  . aspirin EC 81 MG tablet Take 1 tablet (81 mg total) by mouth daily.    [name] atenolol (TENORMIN) 50 MG tablet Take 50 mg by mouth at bedtime.     [name] buPROPion (WELLBUTRIN XL) 300 MG 24 hr tablet Take 1 tablet (300 mg total) by mouth daily. 90 tablet 0  . cetirizine (ZYRTEC) 10 MG tablet Take 10 mg by mouth at bedtime.     . Cholecalciferol (VITAMIN D) 2000 units tablet Take 2,000 Units by mouth daily.    . fluticasone furoate-vilanterol (BREO ELLIPTA) 100-25 MCG/INH AEPB Inhale 1 puff into the lungs daily as needed (shortness of breath).    . insulin lispro (HUMALOG) 100 UNIT/ML injection Inject 2.5 Units into the skin continuous. Use in insulin pump, 2.5 units per hour    . lisinopril-hydrochlorothiazide (PRINZIDE,ZESTORETIC) 20-25 MG tablet Take 1 tablet by  mouth daily.    . Omega-3 Fatty Acids (FISH OIL) 1000 MG CAPS Take 1,000 mg by mouth daily.    [name] omeprazole (PRILOSEC) 40 MG capsule Take 40 mg by mouth daily.    . pravastatin (PRAVACHOL) 80 MG tablet Take 80 mg by mouth at bedtime.     . sertraline (ZOLOFT) 100 MG tablet 100 mg daily for one week, then 150 mg daily (Please start this medication after completing 50 mg daily for one week) 45 tablet 1  . sertraline (ZOLOFT) 50 MG tablet  25 mg daily for one week, then 50 mg daily for one week 21 tablet 0  . terbinafine (LAMISIL) 250 MG tablet Take 250 mg by mouth daily.    [name] venlafaxine XR (EFFEXOR-XR) 150 MG 24 hr capsule Take 150 mg by mouth daily with breakfast.    . venlafaxine XR (EFFEXOR-XR) 75 MG 24 hr capsule Take 75 mg by mouth at bedtime.     No current facility-administered medications for this visit.     Musculoskeletal: Strength & Muscle Tone: N/A Gait & Station: N/A Patient leans: N/A  Psychiatric Specialty Exam: Review of Systems  Psychiatric/Behavioral: Positive for sleep disturbance. Negative for agitation, behavioral problems, confusion, decreased concentration, dysphoric mood, hallucinations, self-injury and suicidal ideas. The patient is nervous/anxious. The patient is not hyperactive.   All other systems reviewed and are negative.   There were no vitals taken for this visit.There is no height or weight on file to calculate BMI.  General Appearance: NA  Eye Contact:  NA  Speech:  Clear and Coherent  Volume:  Normal  Mood:  Anxious  Affect:  NA  Thought Process:  Coherent  Orientation:  Full (Time, Place, and Person)  Thought Content: Logical   Suicidal Thoughts:  No  Homicidal Thoughts:  No  Memory:  Immediate;   Good  Judgement:  Good  Insight:  Fair  Psychomotor Activity:  Normal  Concentration:  Concentration: Good and Attention Span: Good  Recall:  Good  Fund of Knowledge: Good  Language: Good  Akathisia:  No  Handed:  Right  AIMS (if indicated):  not done  Assets:  Communication Skills Desire for Improvement  ADL's:  Intact  Cognition: WNL  Sleep:  Poor   Screenings:   Assessment and Plan:  Paul Webb is a 65 y.o. year old male with a history of depression, anxiety, diabetes, hyperlipidemia. Sleep apnea , who presents for follow up appointment for Mild episode of recurrent major depressive disorder ([place])  Sleep apnea, unspecified type  # MDD, single, mild, recurrent without psychotic features # Panic attacks He reports worsening in anxiety, panic attacks, and continues to have depressive symptoms.  Psychosocial stressors includes demoralization in the context of losing his job due to diabetes.  Will switch from venlafaxine to sertraline to target anxiety and depression.  Discussed potential side effect of serotonin syndrome, and discontinuation symptoms.  The risk of GI side effects and drowsiness has been discussed.  We will continue bupropion as adjunctive treatment for depression.  We will continue Abilify as adjunctive treatment for depression.  Discussed potential metabolic side effect and EPS.  Discussed behavioral activation.   # Sleep apnea He has been unable to use CPAP machine due to worsening in anxiety.  The hope is that the medication change as above would help for his symptoms.  He is also advised to contact his provider whether nasal cannula will be an option for him.    Plan I have reviewed and updated plans as below 1. Change medication as follows: Week 1. Decrease Venlafaxine 150 mg daily, start sertraline 25 mg daily  Week 2. Decrease Venlafaxine 75 mg daily, increase sertraline 50 mg daily  Week 3. (Discontinue venlafaxine), Increase sertraline 100 mg daily  Week 4. Increase sertraline 150 mg daily  2. Continue bupropion 300 mg daily(limited benefit from uptitration of 150 to 300 mg ) 3. Continue Abilify 2 mg daily  4.Next appointment: 4/13 at 8:40 for 20 mins, phone - he declined therapy follow  up -TSH  checked according to his wife a few weeks ago; wnl  Past trials of medication:?lexapro, venlafaxine,  The patient demonstrates the following risk factors for suicide: Chronic risk factors for suicide include:psychiatric disorder ofdepression. Acute risk factorsfor suicide include: family or marital conflict and loss (financial, interpersonal, professional). Protective factorsfor this patient include: coping skills and hope for the future. Considering these factors, the overall suicide risk at this point appears to below. Patientisappropriate for outpatient follow up.   Norman Clay, MD 01/16/2020, 9:03 AM

## 2020-01-16 ENCOUNTER — Ambulatory Visit: Payer: Medicare HMO | Admitting: Psychiatry

## 2020-01-16 ENCOUNTER — Encounter (HOSPITAL_COMMUNITY): Payer: Self-pay | Admitting: Psychiatry

## 2020-01-16 ENCOUNTER — Other Ambulatory Visit: Payer: Self-pay

## 2020-01-16 ENCOUNTER — Ambulatory Visit (INDEPENDENT_AMBULATORY_CARE_PROVIDER_SITE_OTHER): Payer: Medicare PPO | Admitting: Psychiatry

## 2020-01-16 DIAGNOSIS — F330 Major depressive disorder, recurrent, mild: Secondary | ICD-10-CM | POA: Diagnosis not present

## 2020-01-16 DIAGNOSIS — G4730 Sleep apnea, unspecified: Secondary | ICD-10-CM | POA: Diagnosis not present

## 2020-01-16 MED ORDER — SERTRALINE HCL 50 MG PO TABS: ORAL_TABLET | ORAL | 0 refills | Status: DC

## 2020-01-16 MED ORDER — SERTRALINE HCL 100 MG PO TABS: ORAL_TABLET | ORAL | 1 refills | Status: DC

## 2020-01-16 NOTE — Patient Instructions (Addendum)
1. Change medication as follows: Week 1. Decrease Venlafaxine 150 mg daily, start sertraline 25 mg daily  Week 2. Decrease Venlafaxine 75 mg daily, increase sertraline 50 mg daily  Week 3. (Discontinue venlafaxine), Increase sertraline 100 mg daily  Week 4. Increase sertraline 150 mg daily  2. Continuebupropion 300 mg daily 3. Continue Abilfy 2 mg daily  4.Next appointment: 4/13 at 8:40

## 2020-01-19 ENCOUNTER — Other Ambulatory Visit (HOSPITAL_COMMUNITY): Payer: Self-pay | Admitting: Psychiatry

## 2020-01-19 ENCOUNTER — Telehealth (HOSPITAL_COMMUNITY): Payer: Self-pay | Admitting: *Deleted

## 2020-01-19 MED ORDER — VENLAFAXINE HCL ER 75 MG PO CP24: ORAL_CAPSULE | ORAL | 0 refills | Status: DC

## 2020-01-19 NOTE — Telephone Encounter (Signed)
PATIENT'S WIFE CALLED REQUESTED A WEEK ( 7 DAY  SUPPLY) OF venlafaxine XR (EFFEXOR-XR) 75 MG 24 hr capsule. PATIENT DOES HAVE ENOUGH TO TAPER OFF TO THE SERTRALINE

## 2020-01-19 NOTE — Telephone Encounter (Signed)
ordered

## 2020-01-23 ENCOUNTER — Telehealth (HOSPITAL_COMMUNITY): Payer: Self-pay | Admitting: *Deleted

## 2020-01-23 NOTE — Telephone Encounter (Signed)
SPOKE with wife (karen) & per provider: If sertraline makes him awake, advise him to take it in the morning if he has not done so. He may try Trazodone as needed if he is interested.

## 2020-01-23 NOTE — Telephone Encounter (Signed)
Patient's wife called stated weaning off Venlafaxine & starting Sertraline that patient hasn't slept in the last 3 nights. And that he's feeling very anxious. Patient & she asked if there is something he can take to help sleep?

## 2020-01-23 NOTE — Telephone Encounter (Signed)
If sertraline makes him awake, advise him to take it in the morning if he has not done so. He may try Trazodone as needed if he is interested.

## 2020-02-08 ENCOUNTER — Other Ambulatory Visit (HOSPITAL_COMMUNITY): Payer: Self-pay | Admitting: Psychiatry

## 2020-02-08 ENCOUNTER — Telehealth (HOSPITAL_COMMUNITY): Payer: Self-pay | Admitting: *Deleted

## 2020-02-08 MED ORDER — VENLAFAXINE HCL ER 37.5 MG PO CP24: 37.5000 mg | ORAL_CAPSULE | Freq: Every day | ORAL | 0 refills | Status: DC

## 2020-02-08 NOTE — Telephone Encounter (Signed)
Patient's wife called stated that he's taking the  sertraline (ZOLOFT) 100 MG tablet & he's very restless, pacing the floor thru out the day , some forgetfulness, anxious ( not knowing what to do with himself). While asleep last night he thought he was in a box & couldn't get out  Once he realized he was in the bed he awoke & couldn't go back to sleep. All this has been happen since since coming off the Effexor

## 2020-02-08 NOTE — Telephone Encounter (Signed)
Discussed with Santiago Glad, his wife. He was able to sleep better until a few days ago. His symptoms worsen since he discontinued Effexor on 3/13. Santiago Glad reports that he had similar symptoms when he ran out of Effexor years ago. Santiago Glad agreed to do the following:  - Continue sertraline 100 mg daily, restart venlafaxine 37.5 mg daily for two weeks - then increase sertraline 150 mg daily, discontinue venlafaxine  - Karen/patient will contact the office if any worsening in his symptoms

## 2020-02-16 ENCOUNTER — Telehealth (HOSPITAL_COMMUNITY): Payer: Self-pay | Admitting: *Deleted

## 2020-02-16 NOTE — Telephone Encounter (Signed)
PATIENTS WIFE CALLED STATED THAT HE IS STILL TAPERING OFF THE SERTRALINE & HAS STARTED THE  venlafaxine XR (EFFEXOR-XR) 75 MG 24 hr capsule && PANIC EPISODES ARE GETTING BETTER. BUT  STILL HAVING PROBLEMS SLEEPING. PATIENT WIFE ASKED IF THERE IS A OVER THE MEDICATION? OR A MEDICATION THAT COULD BE PRESCRIBED TO HELP HIM SLEEP @ NIGHT  &NOT BE GROGGY IN THE MORNINGS?

## 2020-02-16 NOTE — Telephone Encounter (Signed)
L.M.  FOR RETURN CALL  PER PROVIDER :: He can try Trazodone if interested

## 2020-02-16 NOTE — Telephone Encounter (Signed)
He can try Trazodone if interested.

## 2020-02-17 ENCOUNTER — Other Ambulatory Visit (HOSPITAL_COMMUNITY): Payer: Self-pay | Admitting: Psychiatry

## 2020-02-17 MED ORDER — TRAZODONE HCL 50 MG PO TABS: ORAL_TABLET | ORAL | 0 refills | Status: DC

## 2020-02-17 NOTE — Telephone Encounter (Signed)
SPOKE WITH PATIENT's WIFE & INFORMED PER PROVIDER:  he can try Trazodone if interested. PATIENT IS INTERESTED TRYING TRAZODONE

## 2020-02-17 NOTE — Telephone Encounter (Signed)
Ordered. Trazodone 25-50 mg at night as needed for sleep

## 2020-02-23 ENCOUNTER — Other Ambulatory Visit (HOSPITAL_COMMUNITY): Payer: Self-pay | Admitting: Psychiatry

## 2020-02-23 ENCOUNTER — Telehealth (HOSPITAL_COMMUNITY): Payer: Self-pay | Admitting: *Deleted

## 2020-02-23 MED ORDER — BUPROPION HCL ER (XL) 300 MG PO TB24: 300.0000 mg | ORAL_TABLET | Freq: Every day | ORAL | 0 refills | Status: DC

## 2020-02-23 NOTE — Telephone Encounter (Signed)
Glad to know that he is doing well. He should have enough of those medication until the next visit. Please verify with pharmacy.

## 2020-02-23 NOTE — Telephone Encounter (Signed)
Spoke with Estill Bamberg @ the [facility] & per the pharmacist new scripts need for :  Sertraline:(Zoloft) 50 mg tablet &   Bupropion(Wellbutrin XL)  &    Per  Rx Aripiprazole(Abilify) 2 mg tablet  has only 50 tablets left on  file of the #90

## 2020-02-23 NOTE — Telephone Encounter (Signed)
  Patient's wife called  To inform that patient is doing WELL on  traZODone (DESYREL) 50 MG tablet. And requested refills :: sertraline (ZOLOFT) 50 MG tablet  (will be out soon) ,, buPROPion (WELLBUTRIN XL) 300 MG 24 hr tablet  && ARIPiprazole (ABILIFY) 2 MG tablet

## 2020-02-29 NOTE — Progress Notes (Signed)
Virtual Visit via Telephone Note  I connected with Paul Webb on 03/06/20 at  8:40 AM EDT by telephone and verified that I am speaking with the correct person using two identifiers.   I discussed the limitations, risks, security and privacy concerns of performing an evaluation and management service by telephone and the availability of in person appointments. I also discussed with the patient that there may be a patient responsible charge related to this service. The patient expressed understanding and agreed to proceed.    I discussed the assessment and treatment plan with the patient. The patient was provided an opportunity to ask questions and all were answered. The patient agreed with the plan and demonstrated an understanding of the instructions.   The patient was advised to call back or seek an in-person evaluation if the symptoms worsen or if the condition fails to improve as anticipated.  I provided 12 minutes of non-face-to-face time during this encounter.   Norman Clay, MD    [facility] MD/PA/NP OP Progress Note  03/06/2020 9:02 AM Paul Webb  MRN:  [id number]  Chief Complaint:  Chief Complaint    Depression; Follow-up     HPI:  The following was discussed since the last visit.  - Continue sertraline 100 mg daily, restart venlafaxine 37.5 mg daily for two weeks - then increase sertraline 150 mg daily, discontinue venlafaxine  This is a follow-up appointment for depression.  He states that he has been feeling better.  Although he continues to feel anxious especially due to difficulty in breathing, which she attributes to sinus infection, he feels better overall.  He gets up early, and does yard work.  He enjoyed riding the motorcycle.  He had a good time in [place], and he did a lot of walking with his wife.  He continues to have insomnia, although it has been improving with trazodone.  He denies feeling depressed.  He has more motivation and energy.  He has good appetite.   He denies SI.  He feels anxious without any reason.  He was anxious before this phone call.  He also states that he becomes anxious when his grandchildren are around.  He denies panic attacks.   Paul Webb, his wife presents to the interview.  She believes that he has been more awake during the day.  Although he tends to be anxious especially at night, things has been better overall.    Visit Diagnosis:    ICD-10-CM   1. Current mild episode of major depressive disorder without prior episode ([place])  F32.0   2. Insomnia, unspecified type  G47.00      Past Psychiatric History: Please see initial evaluation for full details. I have reviewed the history. No updates at this time.     Past Medical History:  Past Medical History:  Diagnosis Date  . Diabetes ([place])   . Fast heart beat   . GERD (gastroesophageal reflux disease)   . History of kidney stones     Past Surgical History:  Procedure Laterality Date  . COLONOSCOPY    . COLONOSCOPY N/A 10/06/2018   Procedure: COLONOSCOPY;  Surgeon: Rogene Houston, MD;  [facility];  Service: Endoscopy;  Laterality: N/A;  1:25  . FL INJ RT KNEE CT ARTHROGRAM ([facility] HX)    . POLYPECTOMY  10/06/2018   Procedure: POLYPECTOMY;  Surgeon: Rogene Houston, MD;  Location: [facility];  Service: Endoscopy;;  colon   . Rod in lower leg  trauma  . Rt hernia repair      Family Psychiatric History: Please see initial evaluation for full details. I have reviewed the history. No updates at this time.     Family History:  Family History  Adopted: Yes    Social History:  Social History   Socioeconomic History  . Marital status: Married    Spouse name: Not on file  . Number of children: Not on file  . Years of education: Not on file  . Highest education level: Not on file  Occupational History  . Not on file  Tobacco Use  . Smoking status: Never Smoker  . Smokeless tobacco: Never Used  Substance and Sexual Activity  . Alcohol  use: Never  . Drug use: Never  . Sexual activity: Not on file  Other Topics Concern  . Not on file  Social History Narrative  . Not on file   Social Determinants of Health   Financial Resource Strain:   . Difficulty of Paying Living Expenses:   Food Insecurity:   . Worried About Charity fundraiser in the Last Year:   . Arboriculturist in the Last Year:   Transportation Needs:   . Film/video editor (Medical):   [name] Lack of Transportation (Non-Medical):   Physical Activity:   . Days of Exercise per Week:   . Minutes of Exercise per Session:   Stress:   . Feeling of Stress :   Social Connections:   . Frequency of Communication with Friends and Family:   . Frequency of Social Gatherings with Friends and Family:   . Attends Religious Services:   . Active Member of Clubs or Organizations:   . Attends Archivist Meetings:   [name] Marital Status:     Allergies: No Known Allergies  Metabolic Disorder Labs: No results found for: HGBA1C, MPG No results found for: PROLACTIN No results found for: CHOL, TRIG, HDL, CHOLHDL, VLDL, LDLCALC No results found for: TSH  Therapeutic Level Labs: No results found for: LITHIUM No results found for: VALPROATE No components found for:  CBMZ  Current Medications: Current Outpatient Medications  Medication Sig Dispense Refill  . ARIPiprazole (ABILIFY) 2 MG tablet Take 1 tablet (2 mg total) by mouth daily. 90 tablet 1  . aspirin EC 81 MG tablet Take 1 tablet (81 mg total) by mouth daily.    [name] atenolol (TENORMIN) 50 MG tablet Take 50 mg by mouth at bedtime.     [name] buPROPion (WELLBUTRIN XL) 300 MG 24 hr tablet Take 1 tablet (300 mg total) by mouth daily. 90 tablet 0  . cetirizine (ZYRTEC) 10 MG tablet Take 10 mg by mouth at bedtime.     . Cholecalciferol (VITAMIN D) 2000 units tablet Take 2,000 Units by mouth daily.    . fluticasone furoate-vilanterol (BREO ELLIPTA) 100-25 MCG/INH AEPB Inhale 1 puff into the lungs daily as needed  (shortness of breath).    Derrill Memo ON 03/30/2020] hydrOXYzine (ATARAX/VISTARIL) 25 MG tablet Take 1 tablet (25 mg total) by mouth daily as needed for anxiety. 90 tablet 0  . insulin lispro (HUMALOG) 100 UNIT/ML injection Inject 2.5 Units into the skin continuous. Use in insulin pump, 2.5 units per hour    . lisinopril-hydrochlorothiazide (PRINZIDE,ZESTORETIC) 20-25 MG tablet Take 1 tablet by mouth daily.    . Omega-3 Fatty Acids (FISH OIL) 1000 MG CAPS Take 1,000 mg by mouth daily.    [name] omeprazole (PRILOSEC) 40 MG capsule Take 40 mg  by mouth daily.    . pravastatin (PRAVACHOL) 80 MG tablet Take 80 mg by mouth at bedtime.     . sertraline (ZOLOFT) 100 MG tablet Take 1.5 tablets (150 mg total) by mouth daily. 90 tablet 0  . terbinafine (LAMISIL) 250 MG tablet Take 250 mg by mouth daily.    . traZODone (DESYREL) 100 MG tablet Take 1 tablet (100 mg total) by mouth at bedtime as needed for sleep. 90 tablet 0   No current facility-administered medications for this visit.     Musculoskeletal: Strength & Muscle Tone: N/A Gait & Station: N/A Patient leans: N/A  Psychiatric Specialty Exam: Review of Systems  Psychiatric/Behavioral: Positive for sleep disturbance. Negative for agitation, behavioral problems, confusion, decreased concentration, dysphoric mood, hallucinations, self-injury and suicidal ideas. The patient is nervous/anxious. The patient is not hyperactive.   All other systems reviewed and are negative.   There were no vitals taken for this visit.There is no height or weight on file to calculate BMI.  General Appearance: NA  Eye Contact:  NA  Speech:  Clear and Coherent  Volume:  Normal  Mood:  better  Affect:  NA  Thought Process:  Coherent  Orientation:  Full (Time, Place, and Person)  Thought Content: Logical   Suicidal Thoughts:  No  Homicidal Thoughts:  No  Memory:  Immediate;   Good  Judgement:  Good  Insight:  Fair  Psychomotor Activity:  Normal  Concentration:   Concentration: Good and Attention Span: Good  Recall:  Good  Fund of Knowledge: Good  Language: Good  Akathisia:  No  Handed:  Right  AIMS (if indicated): not done  Assets:  Communication Skills Desire for Improvement  ADL's:  Intact  Cognition: WNL  Sleep:  Poor   Screenings:   Assessment and Plan:  Paul Webb is a 64 y.o. year old male with a history of depression, anxiety,  diabetes, hyperlipidemia, sleep apnea  , who presents for follow up appointment for Current mild episode of major depressive disorder without prior episode ([facility])  Insomnia, unspecified type  # MDD, single, mild, recurrent without psychotic features # Panic attacks There has been overall improvement in depressive symptoms and anxiety since switching from venlafaxine to sertraline.  Psychosocial stressors includes demoralization in the context of losing his job due to diabetes.  Will continue current dose of sertraline to see if it exerts its full benefit.  Will consider further up titration in the future if he has residual mood symptoms at the next visit.  Will continue bupropion as adjunctive treatment for depression.  We will continue Abilify as adjunctive treatment for depression.  Discussed potential metabolic side effect and EPS.  Will continue trazodone as needed for insomnia.  Will continue hydroxyzine as needed for anxiety.  Discussed behavioral activation.   # Sleep apnea He has been unable to use CPAP machine due to worsening in anxiety.  The hope is that the medication change as above would help for his symptoms.  He is also advised to contact his provider whether nasal cannula will be an option for him.    Plan 1. Continue sertraline 150 mg daily  2. Continue bupropion 300 mg daily(limited benefit from uptitration of 150 to 300 mg ) 3.ContinueAbilify 2 mg daily  4. Continue Trazodone 100 mg at night  5. Continue hydroxyzine 25 mg daily as needed for anxiety 6.Next appointment: 5/25 10:20  for 20 mins, phone - he declined therapy follow up -TSH checked according to his  wife a few weeks ago; wnl  Past trials of medication:?lexapro, venlafaxine,  The patient demonstrates the following risk factors for suicide: Chronic risk factors for suicide include:psychiatric disorder ofdepression. Acute risk factorsfor suicide include: family or marital conflict and loss (financial, interpersonal, professional). Protective factorsfor this patient include: coping skills and hope for the future. Considering these factors, the overall suicide risk at this point appears to below. Patientisappropriate for outpatient follow up.   Norman Clay, MD 03/06/2020, 9:02 AM

## 2020-03-02 ENCOUNTER — Other Ambulatory Visit (HOSPITAL_COMMUNITY): Payer: Self-pay | Admitting: Psychiatry

## 2020-03-02 ENCOUNTER — Telehealth (HOSPITAL_COMMUNITY): Payer: Self-pay | Admitting: *Deleted

## 2020-03-02 MED ORDER — HYDROXYZINE HCL 25 MG PO TABS: 25.0000 mg | ORAL_TABLET | Freq: Every day | ORAL | 0 refills | Status: DC | PRN

## 2020-03-02 NOTE — Telephone Encounter (Signed)
PATIENT'S WIFE CALLED STATED THAT HE'S COMPLETELY WEANED OFF THE  venlafaxine XR (EFFEXOR XR)  && IS ON THE 150 MG  OF sertraline (ZOLOFT)  STATED  THAT IT IS WORKING SO WELL . BUT HE'S HAVING ISSUES @ NIGHT WHEN IT STARTS  TO GET DARK AROUND 7 OR 8 PM HE BECOMES VERY RESTLESS & VERY ANXIOUS. PATIENT IS TAKING traZODone (DESYREL) AS PRESCRIBED. WIFE ASKED IF THERE  IS ANYTHING ELSE THAT HE MAY TAKE FOR THE EVENING HOURS WHEN IT GET'S DARK

## 2020-03-02 NOTE — Telephone Encounter (Signed)
Ordered hydroxyzine 25 mg daily as needed for anxiety

## 2020-03-02 NOTE — Telephone Encounter (Signed)
Spoke with wife & informed : He can try hydroxyzine as needed for anxiety if interested. They are interested in trying HYDROXYZINE since it will be PRN.

## 2020-03-02 NOTE — Telephone Encounter (Signed)
He can try hydroxyzine as needed for anxiety if interested.

## 2020-03-06 ENCOUNTER — Other Ambulatory Visit: Payer: Self-pay

## 2020-03-06 ENCOUNTER — Encounter (HOSPITAL_COMMUNITY): Payer: Self-pay | Admitting: Psychiatry

## 2020-03-06 ENCOUNTER — Ambulatory Visit (INDEPENDENT_AMBULATORY_CARE_PROVIDER_SITE_OTHER): Payer: Medicare PPO | Admitting: Psychiatry

## 2020-03-06 DIAGNOSIS — F320 Major depressive disorder, single episode, mild: Secondary | ICD-10-CM | POA: Diagnosis not present

## 2020-03-06 DIAGNOSIS — G4700 Insomnia, unspecified: Secondary | ICD-10-CM

## 2020-03-06 MED ORDER — HYDROXYZINE HCL 25 MG PO TABS: 25.0000 mg | ORAL_TABLET | Freq: Every day | ORAL | 0 refills | Status: DC | PRN

## 2020-03-06 MED ORDER — SERTRALINE HCL 100 MG PO TABS: 150.0000 mg | ORAL_TABLET | Freq: Every day | ORAL | 0 refills | Status: DC

## 2020-03-06 MED ORDER — TRAZODONE HCL 100 MG PO TABS: 100.0000 mg | ORAL_TABLET | Freq: Every evening | ORAL | 0 refills | Status: DC | PRN

## 2020-03-07 ENCOUNTER — Ambulatory Visit: Payer: Medicare PPO | Admitting: Orthopaedic Surgery

## 2020-03-07 ENCOUNTER — Other Ambulatory Visit: Payer: Self-pay

## 2020-03-13 ENCOUNTER — Other Ambulatory Visit (HOSPITAL_COMMUNITY): Payer: Self-pay | Admitting: Psychiatry

## 2020-03-13 ENCOUNTER — Telehealth (HOSPITAL_COMMUNITY): Payer: Self-pay | Admitting: *Deleted

## 2020-03-13 MED ORDER — ARIPIPRAZOLE 2 MG PO TABS: 2.0000 mg | ORAL_TABLET | Freq: Every day | ORAL | 0 refills | Status: DC

## 2020-03-13 NOTE — Telephone Encounter (Signed)
ordered

## 2020-03-13 NOTE — Telephone Encounter (Signed)
REFILL REQUEST FAXED ARIPiprazole (ABILIFY) 2 MG tablet Rx LAST FILLED 02/22/20

## 2020-04-11 NOTE — Progress Notes (Deleted)
[facility] MD/PA/NP OP Progress Note  04/11/2020 9:58 AM Paul Webb  MRN:  [id number]  Chief Complaint:  HPI: *** Visit Diagnosis: No diagnosis found.  Past Psychiatric History: Please see initial evaluation for full details. I have reviewed the history. No updates at this time.     Past Medical History:  Past Medical History:  Diagnosis Date  . Diabetes ([place])   . Fast heart beat   . GERD (gastroesophageal reflux disease)   . History of kidney stones     Past Surgical History:  Procedure Laterality Date  . COLONOSCOPY    . COLONOSCOPY N/A 10/06/2018   Procedure: COLONOSCOPY;  Surgeon: Rogene Houston, MD;  [facility];  Service: Endoscopy;  Laterality: N/A;  1:25  . FL INJ RT KNEE CT ARTHROGRAM ([facility] HX)    . POLYPECTOMY  10/06/2018   Procedure: POLYPECTOMY;  Surgeon: Rogene Houston, MD;  [facility];  Service: Endoscopy;;  colon   . Rod in lower leg     trauma  . Rt hernia repair      Family Psychiatric History: Please see initial evaluation for full details. I have reviewed the history. No updates at this time.     Family History:  Family History  Adopted: Yes    Social History:  Social History   Socioeconomic History  . Marital status: Married    Spouse name: Not on file  . Number of children: Not on file  . Years of education: Not on file  . Highest education level: Not on file  Occupational History  . Not on file  Tobacco Use  . Smoking status: Never Smoker  . Smokeless tobacco: Never Used  Substance and Sexual Activity  . Alcohol use: Never  . Drug use: Never  . Sexual activity: Not on file  Other Topics Concern  . Not on file  Social History Narrative  . Not on file   Social Determinants of Health   Financial Resource Strain:   . Difficulty of Paying Living Expenses:   Food Insecurity:   . Worried About Charity fundraiser in the Last Year:   . Arboriculturist in the Last Year:   Transportation Needs:   . Lexicographer (Medical):   [name] Lack of Transportation (Non-Medical):   Physical Activity:   . Days of Exercise per Week:   . Minutes of Exercise per Session:   Stress:   . Feeling of Stress :   Social Connections:   . Frequency of Communication with Friends and Family:   . Frequency of Social Gatherings with Friends and Family:   . Attends Religious Services:   . Active Member of Clubs or Organizations:   . Attends Archivist Meetings:   [name] Marital Status:     Allergies: No Known Allergies  Metabolic Disorder Labs: No results found for: HGBA1C, MPG No results found for: PROLACTIN No results found for: CHOL, TRIG, HDL, CHOLHDL, VLDL, LDLCALC No results found for: TSH  Therapeutic Level Labs: No results found for: LITHIUM No results found for: VALPROATE No components found for:  CBMZ  Current Medications: Current Outpatient Medications  Medication Sig Dispense Refill  . ARIPiprazole (ABILIFY) 2 MG tablet Take 1 tablet (2 mg total) by mouth daily. 90 tablet 0  . aspirin EC 81 MG tablet Take 1 tablet (81 mg total) by mouth daily.    [name] atenolol (TENORMIN) 50 MG tablet Take 50 mg by mouth  at bedtime.     [name] buPROPion (WELLBUTRIN XL) 300 MG 24 hr tablet Take 1 tablet (300 mg total) by mouth daily. 90 tablet 0  . cetirizine (ZYRTEC) 10 MG tablet Take 10 mg by mouth at bedtime.     . Cholecalciferol (VITAMIN D) 2000 units tablet Take 2,000 Units by mouth daily.    . fluticasone furoate-vilanterol (BREO ELLIPTA) 100-25 MCG/INH AEPB Inhale 1 puff into the lungs daily as needed (shortness of breath).    . hydrOXYzine (ATARAX/VISTARIL) 25 MG tablet Take 1 tablet (25 mg total) by mouth daily as needed for anxiety. 90 tablet 0  . insulin lispro (HUMALOG) 100 UNIT/ML injection Inject 2.5 Units into the skin continuous. Use in insulin pump, 2.5 units per hour    . lisinopril-hydrochlorothiazide (PRINZIDE,ZESTORETIC) 20-25 MG tablet Take 1 tablet by mouth daily.    . Omega-3 Fatty  Acids (FISH OIL) 1000 MG CAPS Take 1,000 mg by mouth daily.    [name] omeprazole (PRILOSEC) 40 MG capsule Take 40 mg by mouth daily.    . pravastatin (PRAVACHOL) 80 MG tablet Take 80 mg by mouth at bedtime.     . sertraline (ZOLOFT) 100 MG tablet Take 1.5 tablets (150 mg total) by mouth daily. 90 tablet 0  . terbinafine (LAMISIL) 250 MG tablet Take 250 mg by mouth daily.    . traZODone (DESYREL) 100 MG tablet Take 1 tablet (100 mg total) by mouth at bedtime as needed for sleep. 90 tablet 0   No current facility-administered medications for this visit.     Musculoskeletal: Strength & Muscle Tone: NA Gait & Station: N/A Patient leans: N/A  Psychiatric Specialty Exam: Review of Systems  There were no vitals taken for this visit.There is no height or weight on file to calculate BMI.  General Appearance: {Appearance:22683}  Eye Contact:  {BHH EYE CONTACT:22684}  Speech:  Clear and Coherent  Volume:  Normal  Mood:  {BHH MOOD:22306}  Affect:  {Affect (PAA):22687}  Thought Process:  Coherent  Orientation:  Full (Time, Place, and Person)  Thought Content: Logical   Suicidal Thoughts:  {ST/HT (PAA):22692}  Homicidal Thoughts:  {ST/HT (PAA):22692}  Memory:  Immediate;   Good  Judgement:  {Judgement (PAA):22694}  Insight:  {Insight (PAA):22695}  Psychomotor Activity:  Normal  Concentration:  Concentration: Good and Attention Span: Good  Recall:  Good  Fund of Knowledge: Good  Language: Good  Akathisia:  No  Handed:  Right  AIMS (if indicated): not done  Assets:  Communication Skills Desire for Improvement  ADL's:  Intact  Cognition: WNL  Sleep:  {BHH GOOD/FAIR/POOR:22877}   Screenings:   Assessment and Plan:  Paul Webb is a 64 y.o. year old male with a history of depression, anxiety,  diabetes, hyperlipidemia, sleep apnea, who presents for follow up appointment for No diagnosis found.  # MDD, single, mild # Panic attacks   There has been overall improvement in depressive  symptoms and anxiety since switching from venlafaxine to sertraline.  Psychosocial stressors includes demoralization in the context of losing his job due to diabetes.  Will continue current dose of sertraline to see if it exerts its full benefit.  Will consider further up titration in the future if he has residual mood symptoms at the next visit.  Will continue bupropion as adjunctive treatment for depression.  We will continue Abilify as adjunctive treatment for depression.  Discussed potential metabolic side effect and EPS.  Will continue trazodone as needed for insomnia.  Will continue hydroxyzine  as needed for anxiety.  Discussed behavioral activation.   # Sleep apnea He has been unable to use CPAP machine due to worsening in anxiety.The hope is that the medication change as above would help for his symptoms.He is also advised to contact his provider whether nasal cannula will be an option for him.   Plan 1.Continue sertraline 150 mg daily  2. Continuebupropion300 mg daily(limited benefit from uptitration of 150 to 300 mg ) 3.ContinueAbilify2 mg daily  4. Continue Trazodone 100 mg at night  5. Continue hydroxyzine 25 mg daily as needed for anxiety 6.Next appointment:5/25 10:20 for 20 mins, phone - he declined therapy follow up -TSH checked according to his wife a few weeks ago; wnl  Past trials of medication:?lexapro, venlafaxine,  The patient demonstrates the following risk factors for suicide: Chronic risk factors for suicide include:psychiatric disorder ofdepression. Acute risk factorsfor suicide include: family or marital conflict and loss (financial, interpersonal, professional). Protective factorsfor this patient include: coping skills and hope for the future. Considering these factors, the overall suicide risk at this point appears to below. Patientisappropriate for outpatient follow up.   Norman Clay, MD 04/11/2020, 9:58 AM

## 2020-04-17 ENCOUNTER — Other Ambulatory Visit: Payer: Self-pay

## 2020-04-17 ENCOUNTER — Telehealth (HOSPITAL_COMMUNITY): Payer: Medicare PPO | Admitting: Psychiatry

## 2020-04-17 ENCOUNTER — Telehealth (HOSPITAL_COMMUNITY): Payer: Self-pay | Admitting: Psychiatry

## 2020-04-17 NOTE — Telephone Encounter (Signed)
Called twice for his appointment this morning. Although somebody picks up the phone, nobody answers the phone. No option to leave a voice message.

## 2020-05-01 ENCOUNTER — Telehealth (HOSPITAL_COMMUNITY): Payer: Self-pay | Admitting: *Deleted

## 2020-05-01 NOTE — Telephone Encounter (Signed)
LMOM (mobile) to inform patient to call office to resch an appt with provider before his medications run out. Office number provided on voicemail.   Called home number and spoke with Alver Fisher (works at pt home) and informed her to please have patient call office to schedule an appt and office number was provided to her and she verbalized understanding.

## 2020-05-08 NOTE — Progress Notes (Signed)
Virtual Visit via Telephone Note  I connected with Paul Webb on 05/14/20 at  8:40 AM EDT by telephone and verified that I am speaking with the correct person using two identifiers.   I discussed the limitations, risks, security and privacy concerns of performing an evaluation and management service by telephone and the availability of in person appointments. I also discussed with the patient that there may be a patient responsible charge related to this service. The patient expressed understanding and agreed to proceed.     I discussed the assessment and treatment plan with the patient. The patient was provided an opportunity to ask questions and all were answered. The patient agreed with the plan and demonstrated an understanding of the instructions.   The patient was advised to call back or seek an in-person evaluation if the symptoms worsen or if the condition fails to improve as anticipated.  Location: patient- home, provider- office   I provided 7 minutes of non-face-to-face time during this encounter.   Norman Clay, MD    [facility] MD/PA/NP OP Progress Note  05/14/2020 8:53 AM Paul Webb  MRN:  [id number]  Chief Complaint:  Chief Complaint    Depression; Follow-up     HPI:  This is a follow-up appointment for depression.  He states that he has been feeling super good.  He has been very active, working on houses, yard work, and cars.  He helps his wife for the visits.  He denies any concern, and is willing to continue his current medication.  He denies feeling depressed.  He has good energy and motivation.  He has good appetite.  He denies any weight change.  He denies SI.  He occasionally feels anxious.  He denies panic attacks.  He rarely takes hydroxyzine.    Visit Diagnosis:    ICD-10-CM   1. MDD (major depressive disorder), recurrent, in full remission ([place])  F33.42     Past Psychiatric History: Please see initial evaluation for full details. I have reviewed the  history. No updates at this time.     Past Medical History:  Past Medical History:  Diagnosis Date  . Diabetes ([place])   . Fast heart beat   . GERD (gastroesophageal reflux disease)   . History of kidney stones     Past Surgical History:  Procedure Laterality Date  . COLONOSCOPY    . COLONOSCOPY N/A 10/06/2018   Procedure: COLONOSCOPY;  Surgeon: Rogene Houston, MD;  [facility];  Service: Endoscopy;  Laterality: N/A;  1:25  . FL INJ RT KNEE CT ARTHROGRAM ([facility] HX)    . POLYPECTOMY  10/06/2018   Procedure: POLYPECTOMY;  Surgeon: Rogene Houston, MD;  [facility];  Service: Endoscopy;;  colon   . Rod in lower leg     trauma  . Rt hernia repair      Family Psychiatric History: Please see initial evaluation for full details. I have reviewed the history. No updates at this time.     Family History:  Family History  Adopted: Yes    Social History:  Social History   Socioeconomic History  . Marital status: Married    Spouse name: Not on file  . Number of children: Not on file  . Years of education: Not on file  . Highest education level: Not on file  Occupational History  . Not on file  Tobacco Use  . Smoking status: Never Smoker  . Smokeless tobacco: Never Used  Vaping Use  . Vaping Use: Never used  Substance and Sexual Activity  . Alcohol use: Never  . Drug use: Never  . Sexual activity: Not on file  Other Topics Concern  . Not on file  Social History Narrative  . Not on file   Social Determinants of Health   Financial Resource Strain:   . Difficulty of Paying Living Expenses:   Food Insecurity:   . Worried About Charity fundraiser in the Last Year:   . Arboriculturist in the Last Year:   Transportation Needs:   . Film/video editor (Medical):   [name] Lack of Transportation (Non-Medical):   Physical Activity:   . Days of Exercise per Week:   . Minutes of Exercise per Session:   Stress:   . Feeling of Stress :   Social  Connections:   . Frequency of Communication with Friends and Family:   . Frequency of Social Gatherings with Friends and Family:   . Attends Religious Services:   . Active Member of Clubs or Organizations:   . Attends Archivist Meetings:   [name] Marital Status:     Allergies: No Known Allergies  Metabolic Disorder Labs: No results found for: HGBA1C, MPG No results found for: PROLACTIN No results found for: CHOL, TRIG, HDL, CHOLHDL, VLDL, LDLCALC No results found for: TSH  Therapeutic Level Labs: No results found for: LITHIUM No results found for: VALPROATE No components found for:  CBMZ  Current Medications: Current Outpatient Medications  Medication Sig Dispense Refill  . [START ON 06/11/2020] ARIPiprazole (ABILIFY) 2 MG tablet Take 1 tablet (2 mg total) by mouth daily. 90 tablet 0  . aspirin EC 81 MG tablet Take 1 tablet (81 mg total) by mouth daily.    [name] atenolol (TENORMIN) 50 MG tablet Take 50 mg by mouth at bedtime.     Derrill Memo ON 05/23/2020] buPROPion (WELLBUTRIN XL) 300 MG 24 hr tablet Take 1 tablet (300 mg total) by mouth daily. 90 tablet 0  . cetirizine (ZYRTEC) 10 MG tablet Take 10 mg by mouth at bedtime.     . Cholecalciferol (VITAMIN D) 2000 units tablet Take 2,000 Units by mouth daily.    . fluticasone furoate-vilanterol (BREO ELLIPTA) 100-25 MCG/INH AEPB Inhale 1 puff into the lungs daily as needed (shortness of breath).    . hydrOXYzine (ATARAX/VISTARIL) 25 MG tablet Take 1 tablet (25 mg total) by mouth daily as needed for anxiety. 90 tablet 0  . insulin lispro (HUMALOG) 100 UNIT/ML injection Inject 2.5 Units into the skin continuous. Use in insulin pump, 2.5 units per hour    . lisinopril-hydrochlorothiazide (PRINZIDE,ZESTORETIC) 20-25 MG tablet Take 1 tablet by mouth daily.    . Omega-3 Fatty Acids (FISH OIL) 1000 MG CAPS Take 1,000 mg by mouth daily.    [name] omeprazole (PRILOSEC) 40 MG capsule Take 40 mg by mouth daily.    . pravastatin (PRAVACHOL) 80 MG  tablet Take 80 mg by mouth at bedtime.     Derrill Memo ON 06/04/2020] sertraline (ZOLOFT) 100 MG tablet Take 1.5 tablets (150 mg total) by mouth daily. 90 tablet 0  . terbinafine (LAMISIL) 250 MG tablet Take 250 mg by mouth daily.    Derrill Memo ON 06/04/2020] traZODone (DESYREL) 100 MG tablet Take 1 tablet (100 mg total) by mouth at bedtime as needed for sleep. 90 tablet 0   No current facility-administered medications for this visit.     Musculoskeletal: Strength & Muscle Tone:  N/A Gait & Station: N/A Patient leans: N/A  Psychiatric Specialty Exam: Review of Systems  Psychiatric/Behavioral: Negative for agitation, behavioral problems, confusion, decreased concentration, dysphoric mood, hallucinations, self-injury, sleep disturbance and suicidal ideas. The patient is nervous/anxious. The patient is not hyperactive.   All other systems reviewed and are negative.   There were no vitals taken for this visit.There is no height or weight on file to calculate BMI.  General Appearance: NA  Eye Contact:  NA  Speech:  Clear and Coherent  Volume:  Normal  Mood:  "good"  Affect:  NA  Thought Process:  Coherent  Orientation:  Full (Time, Place, and Person)  Thought Content: Logical   Suicidal Thoughts:  No  Homicidal Thoughts:  No  Memory:  Immediate;   Good  Judgement:  Good  Insight:  Good  Psychomotor Activity:  Normal  Concentration:  Concentration: Good and Attention Span: Good  Recall:  Good  Fund of Knowledge: Good  Language: Good  Akathisia:  No  Handed:  Right  AIMS (if indicated): not done  Assets:  Communication Skills Desire for Improvement  ADL's:  Intact  Cognition: WNL  Sleep:  Good   Screenings:   Assessment and Plan:  Paul Webb is a 64 y.o. year old male with a history of depression, anxiety, diabetes, hyperlipidemia, sleep apnea , who presents for follow up appointment for below.   1. MDD (major depressive disorder), recurrent, in full remission ([place]) He  denies significant mood symptoms since switching from venlafaxine to sertraline.  Psychosocial stressors includes demoralization due to diabetes, and unemployment.  Will continue current medication regimen.  Will continue sertraline to target depression.  Will continue bupropion as adjunctive treatment for depression.  Will continue Abilify as adjunctive treatment for depression.  Discussed potential risk of metabolic side effect and EPS.  Will continue hydroxyzine as needed for anxiety.    # Sleep apnea Although he has not use the CPAP machine due to worsening in anxiety, there has been improvement in insomnia as his mood improves.  We will continue trazodone as needed for insomnia.   Plan I have reviewed and updated plans as below 1.Continue sertraline 150 mg daily  2. Continuebupropion300 mg daily(limited benefit from uptitration of 150 to 300 mg ) 3.ContinueAbilify2 mg daily  4. Continue Trazodone 100 mg at night  5. Continue hydroxyzine 25 mg daily as needed for anxiety- he declined refill 6.Next appointment:9/27 at 9:10 for 20 mins, phone -TSH checked according to his wife a few weeks ago; wnl  Past trials of medication:?lexapro, venlafaxine,  The patient demonstrates the following risk factors for suicide: Chronic risk factors for suicide include:psychiatric disorder ofdepression. Acute risk factorsfor suicide include: family or marital conflict and loss (financial, interpersonal, professional). Protective factorsfor this patient include: coping skills and hope for the future. Considering these factors, the overall suicide risk at this point appears to below. Patientisappropriate for outpatient follow up.  Norman Clay, MD 05/14/2020, 8:53 AM

## 2020-05-14 ENCOUNTER — Other Ambulatory Visit: Payer: Self-pay

## 2020-05-14 ENCOUNTER — Telehealth (INDEPENDENT_AMBULATORY_CARE_PROVIDER_SITE_OTHER): Payer: Medicare PPO | Admitting: Psychiatry

## 2020-05-14 ENCOUNTER — Encounter (HOSPITAL_COMMUNITY): Payer: Self-pay | Admitting: Psychiatry

## 2020-05-14 DIAGNOSIS — F3342 Major depressive disorder, recurrent, in full remission: Secondary | ICD-10-CM | POA: Diagnosis not present

## 2020-05-14 MED ORDER — BUPROPION HCL ER (XL) 300 MG PO TB24: 300.0000 mg | ORAL_TABLET | Freq: Every day | ORAL | 0 refills | Status: DC

## 2020-05-14 MED ORDER — TRAZODONE HCL 100 MG PO TABS: 100.0000 mg | ORAL_TABLET | Freq: Every evening | ORAL | 0 refills | Status: DC | PRN

## 2020-05-14 MED ORDER — SERTRALINE HCL 100 MG PO TABS: 150.0000 mg | ORAL_TABLET | Freq: Every day | ORAL | 0 refills | Status: DC

## 2020-05-14 MED ORDER — ARIPIPRAZOLE 2 MG PO TABS: 2.0000 mg | ORAL_TABLET | Freq: Every day | ORAL | 0 refills | Status: DC

## 2020-05-14 NOTE — Patient Instructions (Signed)
1.Continue sertraline 150 mg daily  2. Continuebupropion300 mg daily 3.ContinueAbilify2 mg daily  4. Continue Trazodone 100 mg at night  5. Continue hydroxyzine 25 mg daily as needed for anxiety 6.Next appointment:9/27 at 9:10

## 2020-07-11 ENCOUNTER — Encounter (INDEPENDENT_AMBULATORY_CARE_PROVIDER_SITE_OTHER): Payer: Self-pay | Admitting: Gastroenterology

## 2020-07-11 ENCOUNTER — Other Ambulatory Visit (HOSPITAL_COMMUNITY): Payer: Self-pay | Admitting: Psychiatry

## 2020-07-11 MED ORDER — SERTRALINE HCL 100 MG PO TABS: 150.0000 mg | ORAL_TABLET | Freq: Every day | ORAL | 0 refills | Status: DC

## 2020-07-17 DIAGNOSIS — E1065 Type 1 diabetes mellitus with hyperglycemia: Secondary | ICD-10-CM | POA: Diagnosis not present

## 2020-07-18 DIAGNOSIS — J449 Chronic obstructive pulmonary disease, unspecified: Secondary | ICD-10-CM | POA: Diagnosis not present

## 2020-07-18 DIAGNOSIS — F321 Major depressive disorder, single episode, moderate: Secondary | ICD-10-CM | POA: Diagnosis not present

## 2020-07-18 DIAGNOSIS — E1165 Type 2 diabetes mellitus with hyperglycemia: Secondary | ICD-10-CM | POA: Diagnosis not present

## 2020-07-18 DIAGNOSIS — R413 Other amnesia: Secondary | ICD-10-CM | POA: Diagnosis not present

## 2020-07-18 DIAGNOSIS — R5383 Other fatigue: Secondary | ICD-10-CM | POA: Diagnosis not present

## 2020-07-18 DIAGNOSIS — Z79899 Other long term (current) drug therapy: Secondary | ICD-10-CM | POA: Diagnosis not present

## 2020-07-18 DIAGNOSIS — Z299 Encounter for prophylactic measures, unspecified: Secondary | ICD-10-CM | POA: Diagnosis not present

## 2020-07-20 ENCOUNTER — Telehealth (HOSPITAL_COMMUNITY): Payer: Medicare PPO | Admitting: Psychiatry

## 2020-07-25 DIAGNOSIS — R413 Other amnesia: Secondary | ICD-10-CM | POA: Diagnosis not present

## 2020-07-31 ENCOUNTER — Ambulatory Visit (INDEPENDENT_AMBULATORY_CARE_PROVIDER_SITE_OTHER): Payer: Medicare PPO | Admitting: Gastroenterology

## 2020-08-07 NOTE — Progress Notes (Signed)
Virtual Visit via Telephone Note  I connected with Paul Webb on 08/20/20 at 10:00 AM EDT by telephone and verified that I am speaking with the correct person using two identifiers.   I discussed the limitations, risks, security and privacy concerns of performing an evaluation and management service by telephone and the availability of in person appointments. I also discussed with the patient that there may be a patient responsible charge related to this service. The patient expressed understanding and agreed to proceed.     I discussed the assessment and treatment plan with the patient. The patient was provided an opportunity to ask questions and all were answered. The patient agreed with the plan and demonstrated an understanding of the instructions.   The patient was advised to call back or seek an in-person evaluation if the symptoms worsen or if the condition fails to improve as anticipated.  Location: patient- home, provider- office   I provided 8 minutes of non-face-to-face time during this encounter.   Norman Clay, MD    [facility] MD/PA/NP OP Progress Note  08/20/2020 10:16 AM Paul Webb  MRN:  [id number]  Chief Complaint:  Chief Complaint    Follow-up; Depression     HPI:  This is a follow-up appointment for depression.  He states that he has been doing very well.  He is working on the other house.  He is planning to rent or sell it in the future.  His wife is retiring, and they are planning to close ALF before Christmas. He feels good about it.  He enjoyed going to the beach with his wife.  He believes his mood has been good, and denies any concern.  He sleeps well.  He has good appetite.  Although he may gain some weight since he went to the beach, he denies any concerns about this.  He has good energy and motivation.  He denies SI.  He denies anxiety or panic attacks.   Daily routine: working on house, getting out, yard work Employment: unemployed, used to work as a  Administrator till [id number]; he was unable to continue due to insulin use Household: wife Marital status: married for more than 20 years,  Number of children: 2 (with his ex-wife, who was deceased), 2 step children  Visit Diagnosis:    ICD-10-CM   1. MDD (major depressive disorder), recurrent, in full remission ([place])  F33.42     Past Psychiatric History: Please see initial evaluation for full details. I have reviewed the history. No updates at this time.     Past Medical History:  Past Medical History:  Diagnosis Date  . Diabetes ([place])   . Fast heart beat   . GERD (gastroesophageal reflux disease)   . History of kidney stones     Past Surgical History:  Procedure Laterality Date  . COLONOSCOPY    . COLONOSCOPY N/A 10/06/2018   Procedure: COLONOSCOPY;  Surgeon: Rogene Houston, MD;  [facility];  Service: Endoscopy;  Laterality: N/A;  1:25  . FL INJ RT KNEE CT ARTHROGRAM ([facility] HX)    . POLYPECTOMY  10/06/2018   Procedure: POLYPECTOMY;  Surgeon: Rogene Houston, MD;  [facility];  Service: Endoscopy;;  colon   . Rod in lower leg     trauma  . Rt hernia repair      Family Psychiatric History: Please see initial evaluation for full details. I have reviewed the history. No updates at this time.  Family History:  Family History  Adopted: Yes    Social History:  Social History   Socioeconomic History  . Marital status: Married    Spouse name: Not on file  . Number of children: Not on file  . Years of education: Not on file  . Highest education level: Not on file  Occupational History  . Not on file  Tobacco Use  . Smoking status: Never Smoker  . Smokeless tobacco: Never Used  Vaping Use  . Vaping Use: Never used  Substance and Sexual Activity  . Alcohol use: Never  . Drug use: Never  . Sexual activity: Not on file  Other Topics Concern  . Not on file  Social History Narrative  . Not on file   Social Determinants of Health    Financial Resource Strain:   . Difficulty of Paying Living Expenses: Not on file  Food Insecurity:   . Worried About Charity fundraiser in the Last Year: Not on file  . Ran Out of Food in the Last Year: Not on file  Transportation Needs:   . Lack of Transportation (Medical): Not on file  . Lack of Transportation (Non-Medical): Not on file  Physical Activity:   . Days of Exercise per Week: Not on file  . Minutes of Exercise per Session: Not on file  Stress:   . Feeling of Stress : Not on file  Social Connections:   . Frequency of Communication with Friends and Family: Not on file  . Frequency of Social Gatherings with Friends and Family: Not on file  . Attends Religious Services: Not on file  . Active Member of Clubs or Organizations: Not on file  . Attends Archivist Meetings: Not on file  . Marital Status: Not on file    Allergies: No Known Allergies  Metabolic Disorder Labs: No results found for: HGBA1C, MPG No results found for: PROLACTIN No results found for: CHOL, TRIG, HDL, CHOLHDL, VLDL, LDLCALC No results found for: TSH  Therapeutic Level Labs: No results found for: LITHIUM No results found for: VALPROATE No components found for:  CBMZ  Current Medications: Current Outpatient Medications  Medication Sig Dispense Refill  . ARIPiprazole (ABILIFY) 2 MG tablet Take 1 tablet (2 mg total) by mouth daily. 90 tablet 0  . aspirin EC 81 MG tablet Take 1 tablet (81 mg total) by mouth daily.    [name] atenolol (TENORMIN) 50 MG tablet Take 50 mg by mouth at bedtime.     [name] buPROPion (WELLBUTRIN XL) 300 MG 24 hr tablet Take 1 tablet (300 mg total) by mouth daily. 90 tablet 0  . cetirizine (ZYRTEC) 10 MG tablet Take 10 mg by mouth at bedtime.     . Cholecalciferol (VITAMIN D) 2000 units tablet Take 2,000 Units by mouth daily.    . fluticasone furoate-vilanterol (BREO ELLIPTA) 100-25 MCG/INH AEPB Inhale 1 puff into the lungs daily as needed (shortness of breath).    .  hydrOXYzine (ATARAX/VISTARIL) 25 MG tablet Take 1 tablet (25 mg total) by mouth daily as needed for anxiety. 90 tablet 0  . insulin lispro (HUMALOG) 100 UNIT/ML injection Inject 2.5 Units into the skin continuous. Use in insulin pump, 2.5 units per hour    . lisinopril-hydrochlorothiazide (PRINZIDE,ZESTORETIC) 20-25 MG tablet Take 1 tablet by mouth daily.    . Omega-3 Fatty Acids (FISH OIL) 1000 MG CAPS Take 1,000 mg by mouth daily.    [name] omeprazole (PRILOSEC) 40 MG capsule Take 40 mg by  mouth daily.    . pravastatin (PRAVACHOL) 80 MG tablet Take 80 mg by mouth at bedtime.     . sertraline (ZOLOFT) 100 MG tablet Take 1.5 tablets (150 mg total) by mouth daily. 135 tablet 0  . terbinafine (LAMISIL) 250 MG tablet Take 250 mg by mouth daily.    . traZODone (DESYREL) 100 MG tablet Take 1 tablet (100 mg total) by mouth at bedtime as needed for sleep. 90 tablet 0   No current facility-administered medications for this visit.     Musculoskeletal: Strength & Muscle Tone: N/A Gait & Station: N/A Patient leans: N/A  Psychiatric Specialty Exam: Review of Systems  Psychiatric/Behavioral: Negative for agitation, behavioral problems, confusion, decreased concentration, dysphoric mood, hallucinations, self-injury, sleep disturbance and suicidal ideas. The patient is not nervous/anxious and is not hyperactive.   All other systems reviewed and are negative.   There were no vitals taken for this visit.There is no height or weight on file to calculate BMI.  General Appearance: NA  Eye Contact:  NA  Speech:  Clear and Coherent  Volume:  Normal  Mood:  "good"  Affect:  NA  Thought Process:  Coherent  Orientation:  Full (Time, Place, and Person)  Thought Content: Logical   Suicidal Thoughts:  No  Homicidal Thoughts:  No  Memory:  Immediate;   Good  Judgement:  Good  Insight:  Good  Psychomotor Activity:  Normal  Concentration:  Concentration: Good and Attention Span: Good  Recall:  Good  Fund of  Knowledge: Good  Language: Good  Akathisia:  No  Handed:  Right  AIMS (if indicated): not done  Assets:  Communication Skills Desire for Improvement  ADL's:  Intact  Cognition: WNL  Sleep:  Good   Screenings:   Assessment and Plan:  Paul Webb is a 64 y.o. year old male with a history of depression, anxiety,diabetes, hyperlipidemia, sleep apnea , who presents for follow up appointment for below.   1. MDD (major depressive disorder), recurrent, in full remission ([place]) He denies significant mood symptoms since the last visit.  Psychosocial stressors includes demoralization due to diabetes, unemployment.  Will continue current medication regimen (sertraline, bupropion) as maintenance treatment for depression.  He has no known history of seizure.  Will continue Abilify adjunctive treatment for depression.  Discussed potential metabolic side effect and EPS.   # Sleep apnea Although he has not use the CPAP machine due to worsening in anxiety, there has been improvement in insomnia as his mood improves.   He has good benefit from trazodone; will continue this medication for insomnia.   Plan I have reviewed and updated plans as below 1.Continue sertraline 150 mg daily 2. Continuebupropion300 mg daily(limited benefit from uptitration of 150 to 300 mg ) 3.ContinueAbilify2 mg daily  4. Continue Trazodone 100 mg at night  5. Continue hydroxyzine 25 mg daily as needed for anxiety- he declined refill 6.Next appointment:12/20 at 10:20  for 20 mins, phone -TSH checked according to his wife a few weeks ago; wnl  Past trials of medication:?lexapro, venlafaxine,  The patient demonstrates the following risk factors for suicide: Chronic risk factors for suicide include:psychiatric disorder ofdepression. Acute risk factorsfor suicide include: family or marital conflict and loss (financial, interpersonal, professional). Protective factorsfor this patient include: coping skills  and hope for the future. Considering these factors, the overall suicide risk at this point appears to below. Patientisappropriate for outpatient follow up.  Norman Clay, MD 08/20/2020, 10:16 AM

## 2020-08-08 DIAGNOSIS — E1165 Type 2 diabetes mellitus with hyperglycemia: Secondary | ICD-10-CM | POA: Diagnosis not present

## 2020-08-08 DIAGNOSIS — J449 Chronic obstructive pulmonary disease, unspecified: Secondary | ICD-10-CM | POA: Diagnosis not present

## 2020-08-08 DIAGNOSIS — Z299 Encounter for prophylactic measures, unspecified: Secondary | ICD-10-CM | POA: Diagnosis not present

## 2020-08-08 DIAGNOSIS — Z789 Other specified health status: Secondary | ICD-10-CM | POA: Diagnosis not present

## 2020-08-08 DIAGNOSIS — F321 Major depressive disorder, single episode, moderate: Secondary | ICD-10-CM | POA: Diagnosis not present

## 2020-08-08 DIAGNOSIS — R413 Other amnesia: Secondary | ICD-10-CM | POA: Diagnosis not present

## 2020-08-10 ENCOUNTER — Encounter: Payer: Self-pay | Admitting: Neurology

## 2020-08-14 DIAGNOSIS — E1065 Type 1 diabetes mellitus with hyperglycemia: Secondary | ICD-10-CM | POA: Diagnosis not present

## 2020-08-14 DIAGNOSIS — E785 Hyperlipidemia, unspecified: Secondary | ICD-10-CM | POA: Diagnosis not present

## 2020-08-14 DIAGNOSIS — E559 Vitamin D deficiency, unspecified: Secondary | ICD-10-CM | POA: Diagnosis not present

## 2020-08-14 DIAGNOSIS — I10 Essential (primary) hypertension: Secondary | ICD-10-CM | POA: Diagnosis not present

## 2020-08-20 ENCOUNTER — Other Ambulatory Visit: Payer: Self-pay

## 2020-08-20 ENCOUNTER — Encounter (HOSPITAL_COMMUNITY): Payer: Self-pay | Admitting: Psychiatry

## 2020-08-20 ENCOUNTER — Telehealth (INDEPENDENT_AMBULATORY_CARE_PROVIDER_SITE_OTHER): Payer: Medicare PPO | Admitting: Psychiatry

## 2020-08-20 DIAGNOSIS — Z1339 Encounter for screening examination for other mental health and behavioral disorders: Secondary | ICD-10-CM | POA: Diagnosis not present

## 2020-08-20 DIAGNOSIS — F3342 Major depressive disorder, recurrent, in full remission: Secondary | ICD-10-CM | POA: Diagnosis not present

## 2020-08-20 DIAGNOSIS — Z299 Encounter for prophylactic measures, unspecified: Secondary | ICD-10-CM | POA: Diagnosis not present

## 2020-08-20 DIAGNOSIS — Z1331 Encounter for screening for depression: Secondary | ICD-10-CM | POA: Diagnosis not present

## 2020-08-20 DIAGNOSIS — E785 Hyperlipidemia, unspecified: Secondary | ICD-10-CM | POA: Diagnosis not present

## 2020-08-20 DIAGNOSIS — Z7189 Other specified counseling: Secondary | ICD-10-CM | POA: Diagnosis not present

## 2020-08-20 DIAGNOSIS — E1165 Type 2 diabetes mellitus with hyperglycemia: Secondary | ICD-10-CM | POA: Diagnosis not present

## 2020-08-20 DIAGNOSIS — Z0000 Encounter for general adult medical examination without abnormal findings: Secondary | ICD-10-CM | POA: Diagnosis not present

## 2020-08-20 DIAGNOSIS — R5383 Other fatigue: Secondary | ICD-10-CM | POA: Diagnosis not present

## 2020-08-20 DIAGNOSIS — Z6832 Body mass index (BMI) 32.0-32.9, adult: Secondary | ICD-10-CM | POA: Diagnosis not present

## 2020-08-20 MED ORDER — BUPROPION HCL ER (XL) 300 MG PO TB24: 300.0000 mg | ORAL_TABLET | Freq: Every day | ORAL | 0 refills | Status: DC

## 2020-08-20 MED ORDER — TRAZODONE HCL 100 MG PO TABS: 100.0000 mg | ORAL_TABLET | Freq: Every evening | ORAL | 0 refills | Status: DC | PRN

## 2020-08-20 MED ORDER — ARIPIPRAZOLE 2 MG PO TABS: 2.0000 mg | ORAL_TABLET | Freq: Every day | ORAL | 0 refills | Status: DC

## 2020-08-20 NOTE — Patient Instructions (Signed)
1.Continue sertraline 150 mg daily 2. Continuebupropion300 mg daily  3.ContinueAbilify2 mg daily  4. Continue Trazodone 100 mg at night  5. Continue hydroxyzine 25 mg daily as needed for anxiety- he declined refill 6.Next appointment:12/20 at 10:20

## 2020-08-22 DIAGNOSIS — G4700 Insomnia, unspecified: Secondary | ICD-10-CM | POA: Diagnosis not present

## 2020-08-22 DIAGNOSIS — E669 Obesity, unspecified: Secondary | ICD-10-CM | POA: Diagnosis not present

## 2020-08-22 DIAGNOSIS — E1065 Type 1 diabetes mellitus with hyperglycemia: Secondary | ICD-10-CM | POA: Diagnosis not present

## 2020-08-22 DIAGNOSIS — Z79899 Other long term (current) drug therapy: Secondary | ICD-10-CM | POA: Diagnosis not present

## 2020-08-22 DIAGNOSIS — Z7409 Other reduced mobility: Secondary | ICD-10-CM | POA: Diagnosis not present

## 2020-08-22 DIAGNOSIS — G8929 Other chronic pain: Secondary | ICD-10-CM | POA: Diagnosis not present

## 2020-08-22 DIAGNOSIS — Z6831 Body mass index (BMI) 31.0-31.9, adult: Secondary | ICD-10-CM | POA: Diagnosis not present

## 2020-08-22 DIAGNOSIS — F339 Major depressive disorder, recurrent, unspecified: Secondary | ICD-10-CM | POA: Diagnosis not present

## 2020-08-22 DIAGNOSIS — F411 Generalized anxiety disorder: Secondary | ICD-10-CM | POA: Diagnosis not present

## 2020-08-22 DIAGNOSIS — E785 Hyperlipidemia, unspecified: Secondary | ICD-10-CM | POA: Diagnosis not present

## 2020-08-22 DIAGNOSIS — F419 Anxiety disorder, unspecified: Secondary | ICD-10-CM | POA: Diagnosis not present

## 2020-08-22 DIAGNOSIS — Z7951 Long term (current) use of inhaled steroids: Secondary | ICD-10-CM | POA: Diagnosis not present

## 2020-08-22 DIAGNOSIS — I10 Essential (primary) hypertension: Secondary | ICD-10-CM | POA: Diagnosis not present

## 2020-08-22 DIAGNOSIS — Z7982 Long term (current) use of aspirin: Secondary | ICD-10-CM | POA: Diagnosis not present

## 2020-08-29 DIAGNOSIS — E1065 Type 1 diabetes mellitus with hyperglycemia: Secondary | ICD-10-CM | POA: Diagnosis not present

## 2020-09-24 ENCOUNTER — Ambulatory Visit: Payer: Medicare PPO | Admitting: Orthopedic Surgery

## 2020-09-24 DIAGNOSIS — D239 Other benign neoplasm of skin, unspecified: Secondary | ICD-10-CM | POA: Diagnosis not present

## 2020-09-24 DIAGNOSIS — L570 Actinic keratosis: Secondary | ICD-10-CM | POA: Diagnosis not present

## 2020-09-24 DIAGNOSIS — L578 Other skin changes due to chronic exposure to nonionizing radiation: Secondary | ICD-10-CM | POA: Diagnosis not present

## 2020-09-24 DIAGNOSIS — D225 Melanocytic nevi of trunk: Secondary | ICD-10-CM | POA: Diagnosis not present

## 2020-09-24 DIAGNOSIS — L821 Other seborrheic keratosis: Secondary | ICD-10-CM | POA: Diagnosis not present

## 2020-09-24 DIAGNOSIS — L738 Other specified follicular disorders: Secondary | ICD-10-CM | POA: Diagnosis not present

## 2020-09-24 DIAGNOSIS — L814 Other melanin hyperpigmentation: Secondary | ICD-10-CM | POA: Diagnosis not present

## 2020-09-27 ENCOUNTER — Other Ambulatory Visit: Payer: Self-pay

## 2020-09-27 ENCOUNTER — Ambulatory Visit: Payer: Medicare PPO

## 2020-09-27 ENCOUNTER — Ambulatory Visit: Payer: Medicare PPO | Admitting: Orthopedic Surgery

## 2020-09-27 ENCOUNTER — Encounter: Payer: Self-pay | Admitting: Orthopedic Surgery

## 2020-09-27 VITALS — BP 136/79 | HR 65 | Ht 71.0 in | Wt 223.0 lb

## 2020-09-27 DIAGNOSIS — M25522 Pain in left elbow: Secondary | ICD-10-CM

## 2020-09-27 DIAGNOSIS — M7712 Lateral epicondylitis, left elbow: Secondary | ICD-10-CM

## 2020-09-27 NOTE — Patient Instructions (Addendum)
You have received an injection of steroids into the joint. 15% of patients will have increased pain within the 24 hours postinjection.   This is transient and will go away.   We recommend that you use ice packs on the injection site for 20 minutes every 2 hours and extra strength Tylenol 2 tablets every 8 as needed until the pain resolves.  If you continue to have pain after taking the Tylenol and using the ice please call the office for further instructions.  TAKE IBUPROFEN 800 MG TWICE A DAY FOR 2 WEEKS THEN AS NEEDED   WEAR BRACE FOR 6 WEEKS    Tennis Elbow  Tennis elbow (lateral epicondylitis) is inflammation of tendons in your outer forearm, near your elbow. Tendons are tissues that connect muscle to bone. When you have tennis elbow, inflammation affects the tendons that you use to bend your wrist and move your hand up. Inflammation occurs in the lower part of the upper arm bone (humerus), where the tendons connect to the bone (lateral epicondyle). Tennis elbow often affects people who play tennis, but anyone may get the condition from repeatedly extending the wrist or turning the forearm. What are the causes? This condition is usually caused by repeatedly extending the wrist, turning the forearm, and using the hands. It can result from sports or work that requires repetitive forearm movements. In some cases, it may be caused by a sudden injury. What increases the risk? You are more likely to develop tennis elbow if you play tennis or another racket sport. You also have a higher risk if you frequently use your hands for work. Besides people who play tennis, others at greater risk include:  Musicians.  Carpenters, painters, and plumbers.  Cooks.  Cashiers.  People who work in [employer].  Architect workers.  Butchers.  People who use computers. What are the signs or symptoms? Symptoms of this condition include:  Pain and tenderness in the forearm and the outer part of  the elbow. Pain may be felt only when using the arm, or it may be there all the time.  A burning feeling that starts in the elbow and spreads down the forearm.  A weak grip in the hand. How is this diagnosed? This condition may be diagnosed based on:  Your symptoms and medical history.  A physical exam.  X-rays.  MRI. How is this treated? Resting and icing your arm is often the first treatment. Your health care provider may also recommend:  Medicines to reduce pain and inflammation. These may be in the form of a pill, topical gels, or shots of a steroid medicine (cortisone).  An elbow strap to reduce stress on the area.  Physical therapy. This may include massage or exercises.  An elbow brace to restrict the movements that cause symptoms. If these treatments do not help relieve your symptoms, your health care provider may recommend surgery to remove damaged muscle and reattach healthy muscle to bone. Follow these instructions at home: Activity  Rest your elbow and wrist and avoid activities that cause symptoms, as told by your health care provider.  Do physical therapy exercises as instructed.  If you lift an object, lift it with your palm facing up. This reduces stress on your elbow. Lifestyle  If your tennis elbow is caused by sports, check your equipment and make sure that: ? You are using it correctly. ? It is the best fit for you.  If your tennis elbow is caused by work or computer  use, take frequent breaks to stretch your arm. Talk with your manager about ways to manage your condition at work. If you have a brace:  Wear the brace or strap as told by your health care provider. Remove it only as told by your health care provider.  Loosen the brace if your fingers tingle, become numb, or turn cold and blue.  Keep the brace clean.  If the brace is not waterproof, ask if you may remove it for bathing. If you must keep the brace on while bathing: ? Do not let it get  wet. ? Cover it with a watertight covering when you take a bath or a shower. General instructions   If directed, put ice on the painful area: ? Put ice in a plastic bag. ? Place a towel between your skin and the bag. ? Leave the ice on for 20 minutes, 2-3 times a day.  Take over-the-counter and prescription medicines only as told by your health care provider.  Keep all follow-up visits as told by your health care provider. This is important. Contact a health care provider if:  You have pain that gets worse or does not get better with treatment.  You have numbness or weakness in your forearm, hand, or fingers. Summary  Tennis elbow (lateral epicondylitis) is inflammation of tendons in your outer forearm, near your elbow.  Common symptoms include pain and tenderness in your forearm and the outer part of your elbow.  This condition is usually caused by repeatedly extending your wrist, turning your forearm, and using your hands.  The first treatment is often resting and icing your arm to relieve symptoms. Further treatment may include taking medicine, getting physical therapy, wearing a brace or strap, or having surgery. This information is not intended to replace advice given to you by your health care provider. Make sure you discuss any questions you have with your health care provider. Document Revised: 08/06/2018 Document Reviewed: 08/25/2017 Elsevier Patient Education  [place].

## 2020-09-27 NOTE — Progress Notes (Signed)
Chief Complaint  Patient presents with  . Elbow Pain    left since falling off ladder in May     64 year old male fell off of a ladder in May injured his left side with multiple bruises did not require any surgery comes in with persistent lateral elbow pain painful extension and painful wrist extension and difficulty caring light to heavy objects in his left hand  System review occasional shortness of breath wheezing heartburn diarrhea occasional flank pain from kidney stones dizziness tremor depression all other systems negative  Past Medical History:  Diagnosis Date  . Diabetes ([place])   . Fast heart beat   . GERD (gastroesophageal reflux disease)   . History of kidney stones    BP 136/79   Pulse 65   Ht 5\' 11"  (1.803 m)   Wt 223 lb (101.2 kg)   BMI 31.10 kg/m   Physical Exam Vitals and nursing note reviewed.  Constitutional:      Appearance: Normal appearance.  Neurological:     Mental Status: He is alert and oriented to person, place, and time.  Psychiatric:        Mood and Affect: Mood normal.    Left elbow full extension and flexion full pronation supination there was some pain with full extension  Lateral epicondyle soft tissues are tender he has painful wrist extension against resistance especially the long finger neurovascular exam is intact ligaments are stable  No strength deficits.  X-rays show a small calcification posterior and superior to the olecranon but no evidence of fracture  Encounter Diagnosis  Name Primary?  . Pain in left elbow Yes    Recommend injection he agreed  Inject left elbow\ Procedure note injection for left tennis elbow  Diagnosis left tennis elbow  Anesthesia ethyl chloride was used Alcohol use is clean the skin  After we obtained verbal consent and timeout a 25-gauge needle was used to inject 40 mg of Depo-Medrol and 3 cc of 1% lidocaine just distal to the insertion of the ECRB  There were no complications and a sterile  bandage was applied.  Ibuprofen for 2 weeks Tennis elbow strengthening stretching program Cryotherapy Elbow brace  If no improvement after 6 weeks come back for repeat injection

## 2020-10-05 DIAGNOSIS — E1065 Type 1 diabetes mellitus with hyperglycemia: Secondary | ICD-10-CM | POA: Diagnosis not present

## 2020-10-11 DIAGNOSIS — E1065 Type 1 diabetes mellitus with hyperglycemia: Secondary | ICD-10-CM | POA: Diagnosis not present

## 2020-10-26 NOTE — Progress Notes (Signed)
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  He is accompanied by his wife who supplements history.  PCP note reviewed.  He reportedly has trouble with recall and makes paraphasic errors.  For example, if he asks for the glove, he may instead say "hand me the stethoscope".  He does sometimes seem to have word-finding difficulty in that he searches for words.  It may occur every few days.  He also has balance issues for about a year.  He doesn't have trouble walking on even ground.  It occurs when walking on uneven ground and may stumble.  He has tumbled over.  He has had about 5 falls over the past 1 1/2 years, may have hit his head.  When he climbs off of the ladder, he may feel unsteady for a moment.  One time, he fell off of the ladder.  He feels like he needs to hold onto something to keep steady.  He has diabetes mellitus and with an Insulin pump.  Blood sugars have not been low.  Labs from August 2021 include B12 500; TSH 1.720; CMP with Na 141, K 4.1, Cl 101, CO2 26, Ca 9.1, glucose 196, BUN 27, Cr 1.12, t bili 0.4, ALP 101, AST 13, ALT 14.  He has had uncontrolled diabetes (8) until 3 months ago it was in the 6-7 range.  CT head performed within last year reportedly showed chronic small vessel disease with remote lacunar infarcts.        He has depression and anxiety, for which he takes Abilify, Wellbutrin XL, sertraline, and hydroxyzine.  For insomnia, he takes trazodone.  He has sleep apnea but unable to tolerate a CPAP.  He is adopted so family history is unknown.  PAST MEDICAL HISTORY: Past Medical  History:  Diagnosis Date  . Diabetes ([place])   . Fast heart beat   . GERD (gastroesophageal reflux disease)   . History of kidney stones     PAST SURGICAL HISTORY: Past Surgical History:  Procedure Laterality Date  . COLONOSCOPY    . COLONOSCOPY N/A 10/06/2018   Procedure: COLONOSCOPY;  Surgeon: Rogene Houston, MD;  Location: [facility];  Service: Endoscopy;  Laterality: N/A;  1:25  . FL INJ RT KNEE CT ARTHROGRAM ([facility] H[facility])    . POLYPECTOMY  10/06/2018   Procedure: POLYPECTOMY;  Surgeon: Rogene Houston, MD;  Location: [facility];  Service: Endoscopy;;  colon   . Rod in lower leg     trauma  . Rt hernia repair      MEDICATIONS: Current Outpatient Medications on File Prior to Visit  Medication Sig Dispense Refill  . ARIPiprazole (ABILIFY) 2 MG tablet Take 1 tablet (2 mg total) by mouth daily. 90 tablet 0  . aspirin EC 81 MG tablet Take 1 tablet (81 mg total) by mouth daily.    [name] atenolol (TENORMIN) 50 MG tablet Take 50 mg by mouth at bedtime.     [name] Bioflavonoid Products (BIOFLEX PO) Take by mouth.    [name] buPROPion (WELLBUTRIN XL) 300 MG 24 hr tablet Take 1 tablet (300 mg total) by mouth daily. 90 tablet 0  .  cetirizine (ZYRTEC) 10 MG tablet Take 10 mg by mouth at bedtime.     . Cholecalciferol (VITAMIN D) 2000 units tablet Take 2,000 Units by mouth daily.    . fluticasone furoate-vilanterol (BREO ELLIPTA) 100-25 MCG/INH AEPB Inhale 1 puff into the lungs daily as needed (shortness of breath).    . hydrOXYzine (ATARAX/VISTARIL) 25 MG tablet Take 1 tablet (25 mg total) by mouth daily as needed for anxiety. 90 tablet 0  . lisinopril-hydrochlorothiazide (PRINZIDE,ZESTORETIC) 20-25 MG tablet Take 1 tablet by mouth daily.    [name] NOVOLOG 100 UNIT/ML injection     . Omega-3 Fatty Acids (FISH OIL) 1000 MG CAPS Take 1,000 mg by mouth daily.    [name] omeprazole (PRILOSEC) 40 MG capsule Take 40 mg by mouth daily.    . pravastatin (PRAVACHOL) 80 MG tablet Take 80 mg by mouth at bedtime.     .  sertraline (ZOLOFT) 100 MG tablet Take 1.5 tablets (150 mg total) by mouth daily. 135 tablet 0  . SYMLINPEN 60 1500 MCG/1.5ML SOPN     . traZODone (DESYREL) 100 MG tablet Take 1 tablet (100 mg total) by mouth at bedtime as needed for sleep. 90 tablet 0   No current facility-administered medications on file prior to visit.    ALLERGIES: No Known Allergies  FAMILY HISTORY: Family History  Adopted: Yes    SOCIAL HISTORY: Social History   Socioeconomic History  . Marital status: Married    Spouse name: Not on file  . Number of children: Not on file  . Years of education: Not on file  . Highest education level: Not on file  Occupational History  . Not on file  Tobacco Use  . Smoking status: Never Smoker  . Smokeless tobacco: Never Used  Vaping Use  . Vaping Use: Never used  Substance and Sexual Activity  . Alcohol use: Never  . Drug use: Never  . Sexual activity: Not on file  Other Topics Concern  . Not on file  Social History Narrative  . Not on file   Social Determinants of Health   Financial Resource Strain:   . Difficulty of Paying Living Expenses: Not on file  Food Insecurity:   . Worried About Charity fundraiser in the Last Year: Not on file  . Ran Out of Food in the Last Year: Not on file  Transportation Needs:   . Lack of Transportation (Medical): Not on file  . Lack of Transportation (Non-Medical): Not on file  Physical Activity:   . Days of Exercise per Week: Not on file  . Minutes of Exercise per Session: Not on file  Stress:   . Feeling of Stress : Not on file  Social Connections:   . Frequency of Communication with Friends and Family: Not on file  . Frequency of Social Gatherings with Friends and Family: Not on file  . Attends Religious Services: Not on file  . Active Member of Clubs or Organizations: Not on file  . Attends Archivist Meetings: Not on file  . Marital Status: Not on file  Intimate Partner Violence:   . Fear of Current  or Ex-Partner: Not on file  . Emotionally Abused: Not on file  . Physically Abused: Not on file  . Sexually Abused: Not on file    Objective:  Blood pressure (!) 144/84, pulse 75, height 5\' 11"  (1.803 m), weight 233 lb (105.7 kg), SpO2 95 %. General: No acute distress.  Patient appears well-groomed.   Head:  Normocephalic/atraumatic Eyes:  fundi examined but not visualized Neck: supple, no paraspinal tenderness, full range of motion Back: No paraspinal tenderness Heart: regular rate and rhythm Lungs: Clear to auscultation bilaterally. Vascular: No carotid bruits. Neurological Exam: Mental status:  [employer] Mental Exam 10/29/2020  Weekday Correct 1  Current year 1  What state are we in? 1  Amount spent 1  Amount left 2  # of Animals 3  5 objects recall 5  Number series 2  Hour markers 2  Time correct 2  Placed X in triangle correctly 1  Largest Figure 1  Name of male 2  Date back to work 0  Type of work 2  State she lived in 0  Total score 26    Cranial nerves: CN I: not tested CN II: pupils equal, round and reactive to light, visual fields intact CN III, IV, VI:  full range of motion, no nystagmus, no ptosis CN V: facial sensation intact. CN VII: upper and lower face symmetric CN VIII: hearing intact CN IX, X: gag intact, uvula midline CN XI: sternocleidomastoid and trapezius muscles intact CN XII: tongue midline Bulk & Tone: normal, no fasciculations. Motor:  muscle strength 5/5 throughout Sensation:  Pinprick sensation intact; vibratory sensation reduced in feet. Deep Tendon Reflexes:  trace throughout,  toes downgoing.   Finger to nose testing:  Without dysmetria.   Heel to shin:  Without dysmetria.   Gait:  Normal station and stride.  Some struggle with tandem walk.  Romberg with mild sway.  Assessment/Plan:   1.  Word-finding difficulty -  May be secondary to cerebrovascular disease vs untreated sleep apnea vs neurodegenerative disease 2.   Balance disorder -  Likely related to underlying diabetic polyneuropathy.   3.  Type 2 diabetes with polyneuropathy 4.  Sleep apnea  1.  Check MRI of brain without contrast 2.  Order neuropsychological testing 3.  Advised to work on healthy and safe diet for weight loss and health. 4.  Increase exercise 5.  Continue optimizing glycemic control 6.  Follow up after testing.    Thank you for allowing me to take part in the care of this patient.  Metta Clines, DO  CC:  Jerene Bears, MD

## 2020-10-29 ENCOUNTER — Other Ambulatory Visit: Payer: Self-pay

## 2020-10-29 ENCOUNTER — Ambulatory Visit: Payer: Medicare PPO | Admitting: Neurology

## 2020-10-29 ENCOUNTER — Encounter: Payer: Self-pay | Admitting: Neurology

## 2020-10-29 VITALS — BP 144/84 | HR 75 | Ht 71.0 in | Wt 233.0 lb

## 2020-10-29 DIAGNOSIS — G4730 Sleep apnea, unspecified: Secondary | ICD-10-CM

## 2020-10-29 DIAGNOSIS — R2689 Other abnormalities of gait and mobility: Secondary | ICD-10-CM

## 2020-10-29 DIAGNOSIS — E1142 Type 2 diabetes mellitus with diabetic polyneuropathy: Secondary | ICD-10-CM | POA: Diagnosis not present

## 2020-10-29 DIAGNOSIS — R4789 Other speech disturbances: Secondary | ICD-10-CM | POA: Diagnosis not present

## 2020-10-29 NOTE — Patient Instructions (Signed)
1.  Will check MRI of brain without contrast 2.  Will order neurocognitive testing 3.  Try to follow up with your doctor regarding safe and appropriate diet for health and weight loss 4.  Try to increase walks. 5.  Follow up after testing.

## 2020-11-02 DIAGNOSIS — M1990 Unspecified osteoarthritis, unspecified site: Secondary | ICD-10-CM | POA: Diagnosis not present

## 2020-11-02 DIAGNOSIS — Z299 Encounter for prophylactic measures, unspecified: Secondary | ICD-10-CM | POA: Diagnosis not present

## 2020-11-02 DIAGNOSIS — M2550 Pain in unspecified joint: Secondary | ICD-10-CM | POA: Diagnosis not present

## 2020-11-02 DIAGNOSIS — F321 Major depressive disorder, single episode, moderate: Secondary | ICD-10-CM | POA: Diagnosis not present

## 2020-11-02 DIAGNOSIS — J45909 Unspecified asthma, uncomplicated: Secondary | ICD-10-CM | POA: Diagnosis not present

## 2020-11-02 DIAGNOSIS — E1165 Type 2 diabetes mellitus with hyperglycemia: Secondary | ICD-10-CM | POA: Diagnosis not present

## 2020-11-06 ENCOUNTER — Telehealth: Payer: Self-pay

## 2020-11-06 ENCOUNTER — Other Ambulatory Visit: Payer: Self-pay | Admitting: Psychiatry

## 2020-11-06 MED ORDER — SERTRALINE HCL 100 MG PO TABS
150.0000 mg | ORAL_TABLET | Freq: Every day | ORAL | 0 refills | Status: DC
Start: 2020-11-06 — End: 2021-02-11

## 2020-11-06 NOTE — Telephone Encounter (Signed)
Pharmacy states that they received the rx on 07-11-20 and filled it on 9-11 for 30 days , 10-13 for 30 day and 11-11 for 30 days. So pt is out.    (this is why I you need to make comments on rx : "Do Not Fill Until ____"   sertraline (ZOLOFT) 100 MG tablet Medication Date: 07/11/2020 Department: [facility]- Ordering/Authorizing: Norman Clay, MD    Order Providers  Prescribing Provider Encounter Provider  Norman Clay, MD Norman Clay, MD   Outpatient Medication Detail   Disp Refills Start End   sertraline (ZOLOFT) 100 MG tablet 135 tablet 0 08/18/2020    Sig - Route: Take 1.5 tablets (150 mg total) by mouth daily. - Oral   Sent to pharmacy as: sertraline (ZOLOFT) 100 MG tablet   E-Prescribing Status: Receipt confirmed by pharmacy (07/11/2020 8:44 AM EDT)    Pharmacy

## 2020-11-06 NOTE — Progress Notes (Signed)
Virtual Visit via Telephone Note  I connected with Paul Webb on 11/12/20 at 10:20 AM EST by telephone and verified that I am speaking with the correct person using two identifiers.  Location: Patient: home Provider: office Persons participated in the visit- patient, provider   I discussed the limitations, risks, security and privacy concerns of performing an evaluation and management service by telephone and the availability of in person appointments. I also discussed with the patient that there may be a patient responsible charge related to this service. The patient expressed understanding and agreed to proceed.   I discussed the assessment and treatment plan with the patient. The patient was provided an opportunity to ask questions and all were answered. The patient agreed with the plan and demonstrated an understanding of the instructions.   The patient was advised to call back or seek an in-person evaluation if the symptoms worsen or if the condition fails to improve as anticipated.  I provided 16 minutes of non-face-to-face time during this encounter.   Norman Clay, MD    [facility] MD/PA/NP OP Progress Note  11/12/2020 10:48 AM Paul Webb  MRN:  [id number]  Chief Complaint:  Chief Complaint    Follow-up; Depression     HPI:  This is a follow-up appointment for depression.  He states that there was a few days he feels sluggish, and had anhedonia.  He is doing well otherwise.  He enjoys going out with his wife.  He feels good that now the schedule is more flexible as his wife is a retired.  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  He had a good Thanksgiving with his family.  He is decorating the house with lease for Christmas.  He sleeps fair.  He has good concentration.  He gained 8 pounds since last visit.  His endocrinologist changed his medication for diabetes as it can be contributing to difficulty in weight loss.  He has fair energy.  He denies  SI.    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 MDD (major depressive disorder), recurrent, in partial remission ([place])  F33.41     Past Psychiatric History: Please see initial evaluation for full details. I have reviewed the history. No updates at this time.     Past Medical History:  Past Medical History:  Diagnosis Date  . Diabetes ([place])   . Fast heart beat   . GERD (gastroesophageal reflux disease)   . History of kidney stones     Past Surgical History:  Procedure Laterality Date  . COLONOSCOPY    . COLONOSCOPY N/A 10/06/2018   Procedure: COLONOSCOPY;  Surgeon: Rogene Houston, MD;  Location: [facility];  Service: Endoscopy;  Laterality: N/A;  1:25  . FL INJ RT KNEE CT ARTHROGRAM ([facility] HX)    . POLYPECTOMY  10/06/2018   Procedure: POLYPECTOMY;  Surgeon: Rogene Houston, MD;  Location: [facility];  Service: Endoscopy;;  colon   . Rod in lower leg     trauma  . Rt hernia repair      Family Psychiatric History: Please see initial evaluation for full details. I  have reviewed the history. No updates at this time.     Family History:  Family History  Adopted: Yes    Social History:  Social History   Socioeconomic History  . Marital status: Married    Spouse name: Not on file  . Number of children: Not on file  . Years of education: Not on file  . Highest education level: Not on file  Occupational History  . Not on file  Tobacco Use  . Smoking status: Never Smoker  . Smokeless tobacco: Never Used  Vaping Use  . Vaping Use: Never used  Substance and Sexual Activity  . Alcohol use: Never  . Drug use: Never  . Sexual activity: Not on file  Other  Topics Concern  . Not on file  Social History Narrative   Right Handed   Lives in one story with a basement    Drinks Caffeine    Social Determinants of Health   Financial Resource Strain: Not on file  Food Insecurity: Not on file  Transportation Needs: Not on file  Physical Activity: Not on file  Stress: Not on file  Social Connections: Not on file    Allergies: No Known Allergies  Metabolic Disorder Labs: No results found for: HGBA1C, MPG No results found for: PROLACTIN No results found for: CHOL, TRIG, HDL, CHOLHDL, VLDL, LDLCALC No results found for: TSH  Therapeutic Level Labs: No results found for: LITHIUM No results found for: VALPROATE No components found for:  CBMZ  Current Medications: Current Outpatient Medications  Medication Sig Dispense Refill  . ARIPiprazole (ABILIFY) 2 MG tablet Take 1 tablet (2 mg total) by mouth daily. 30 tablet 2  . aspirin EC 81 MG tablet Take 1 tablet (81 mg total) by mouth daily.    [name] atenolol (TENORMIN) 50 MG tablet Take 50 mg by mouth at bedtime.     [name] Bioflavonoid Products (BIOFLEX PO) Take by mouth.    [name] buPROPion (WELLBUTRIN XL) 300 MG 24 hr tablet Take 1 tablet (300 mg total) by mouth daily. 30 tablet 2  . cetirizine (ZYRTEC) 10 MG tablet Take 10 mg by mouth at bedtime.     . Cholecalciferol (VITAMIN D) 2000 units tablet Take 2,000 Units by mouth daily.    . fluticasone furoate-vilanterol (BREO ELLIPTA) 100-25 MCG/INH AEPB Inhale 1 puff into the lungs daily as needed (shortness of breath).    . hydrOXYzine (ATARAX/VISTARIL) 25 MG tablet Take 1 tablet (25 mg total) by mouth daily as needed for anxiety. 90 tablet 0  . lisinopril-hydrochlorothiazide (PRINZIDE,ZESTORETIC) 20-25 MG tablet Take 1 tablet by mouth daily.    . Multiple Vitamin (MULTIVITAMIN) tablet Take 1 tablet by mouth daily.    [name] NOVOLOG 100 UNIT/ML injection     . Omega-3 Fatty Acids (FISH OIL) 1000 MG CAPS Take 1,000 mg by mouth daily. (Patient not taking: Reported  on 10/29/2020)    . omeprazole (PRILOSEC) 40 MG capsule Take 40 mg by mouth daily.    . pravastatin (PRAVACHOL) 80 MG tablet Take 80 mg by mouth at bedtime.     . sertraline (ZOLOFT) 100 MG tablet Take 1.5 tablets (150 mg total) by mouth daily. 135 tablet 0  . SYMLINPEN 60 1500 MCG/1.5ML SOPN     . traZODone (DESYREL) 100 MG tablet Take 1 tablet (100 mg total) by mouth at bedtime as needed for sleep. 30 tablet 2   No current facility-administered medications for this visit.     Musculoskeletal: Strength & Muscle Tone:  N/A Gait & Station: N/A Patient leans: N/A  Psychiatric Specialty Exam: Review of Systems  Psychiatric/Behavioral: Positive for dysphoric mood. Negative for agitation, behavioral problems, confusion, decreased concentration, hallucinations, self-injury, sleep disturbance and suicidal ideas. The patient is not nervous/anxious and is not hyperactive.   All other systems reviewed and are negative.   There were no vitals taken for this visit.There is no height or weight on file to calculate BMI.  General Appearance: NA  Eye Contact:  NA  Speech:  Clear and Coherent  Volume:  Normal  Mood:  good  Affect:  NA  Thought Process:  Coherent  Orientation:  Full (Time, Place, and Person)  Thought Content: Logical   Suicidal Thoughts:  No  Homicidal Thoughts:  No  Memory:  Immediate;   Good  Judgement:  Good  Insight:  Fair  Psychomotor Activity:  Normal  Concentration:  Concentration: Good and Attention Span: Good  Recall:  Good  Fund of Knowledge: Good  Language: Good  Akathisia:  No  Handed:  Right  AIMS (if indicated): not done  Assets:  Communication Skills Desire for Improvement  ADL's:  Intact  Cognition: WNL  Sleep:  Good   Screenings:   Assessment and Plan:  Paul Webb is a 64 y.o. year old male with a history of depression, anxiety,diabetes, hyperlipidemia, sleep apnea, who presents for follow up appointment for below.   1. MDD (major depressive  disorder), recurrent, in partial remission ([place]) He reports occasional depressed mood since the last visit without any triggers.  Psychosocial stressors includes demoralization due to diabetes and employment.  Will continue current medication regimen at this time.  Will continue sertraline and bupropion to target depression.  He has no known history of seizure.  We will continue Abilify as adjunctive treatment for depression.  Discussed potential metabolic side effect and EPS.  He agrees to continue this medication at this time given his recent relapse in his mood symptoms/change in his diabetes medication to mitigate weight gain.   # Sleep apnea He continues to snore at night.  He is advised to contact his provider regarding CPAP machine, which he has not been able to use due to anxiety.  Will continue trazodone as needed for insomnia.   Plan I have reviewed and updated plans as below 1.Continue sertraline 150 mg daily 2. Continuebupropion300 mg daily(limited benefit from uptitration of 150 to 300 mg ) 3.ContinueAbilify2 mg daily - monitor weight  4. Continue Trazodone 100 mg at night  5. Continue hydroxyzine 25 mg daily as needed for anxiety- he declined refill 6.Next appointment: 3/21 at 9 AM for 20 mins, phone -TSH checked according to his wife a few weeks ago; wnl  Past trials of medication:?lexapro, venlafaxine,  The patient demonstrates the following risk factors for suicide: Chronic risk factors for suicide include:psychiatric disorder ofdepression. Acute risk factorsfor suicide include: family or marital conflict and loss (financial, interpersonal, professional). Protective factorsfor this patient include: coping skills and hope for the future. Considering these factors, the overall suicide risk at this point appears to below. Patientisappropriate for outpatient follow up.  Norman Clay, MD 11/12/2020, 10:48 AM

## 2020-11-06 NOTE — Telephone Encounter (Signed)
Received a fax requesting a refill on the sertraline    sertraline (ZOLOFT) 100 MG tablet Medication Date: 07/11/2020 Department: [facility] Ordering/Authorizing: Norman Clay, MD    Order Providers  Prescribing Provider Encounter Provider  Norman Clay, MD Norman Clay, MD   Outpatient Medication Detail   Disp Refills Start End   sertraline (ZOLOFT) 100 MG tablet 135 tablet 0 08/18/2020    Sig - Route: Take 1.5 tablets (150 mg total) by mouth daily. - Oral   Sent to pharmacy as: sertraline (ZOLOFT) 100 MG tablet   E-Prescribing Status: Receipt confirmed by pharmacy (07/11/2020 8:44 AM EDT)    Rodriguez Hevia, [address]

## 2020-11-06 NOTE — Telephone Encounter (Signed)
Decline. Enough meds ordered until the next visit

## 2020-11-12 ENCOUNTER — Telehealth (INDEPENDENT_AMBULATORY_CARE_PROVIDER_SITE_OTHER): Payer: Medicare PPO | Admitting: Psychiatry

## 2020-11-12 ENCOUNTER — Telehealth (HOSPITAL_COMMUNITY): Payer: Medicare PPO | Admitting: Psychiatry

## 2020-11-12 ENCOUNTER — Other Ambulatory Visit: Payer: Self-pay

## 2020-11-12 ENCOUNTER — Encounter: Payer: Self-pay | Admitting: Psychiatry

## 2020-11-12 DIAGNOSIS — F3341 Major depressive disorder, recurrent, in partial remission: Secondary | ICD-10-CM | POA: Diagnosis not present

## 2020-11-12 MED ORDER — BUPROPION HCL ER (XL) 300 MG PO TB24
300.0000 mg | ORAL_TABLET | Freq: Every day | ORAL | 2 refills | Status: DC
Start: 2020-11-12 — End: 2021-02-11

## 2020-11-12 MED ORDER — TRAZODONE HCL 100 MG PO TABS
100.0000 mg | ORAL_TABLET | Freq: Every evening | ORAL | 2 refills | Status: DC | PRN
Start: 2020-11-12 — End: 2021-02-11

## 2020-11-12 MED ORDER — SERTRALINE HCL 100 MG PO TABS: 150.0000 mg | ORAL_TABLET | Freq: Every day | ORAL | 0 refills | Status: DC

## 2020-11-12 MED ORDER — ARIPIPRAZOLE 2 MG PO TABS
2.0000 mg | ORAL_TABLET | Freq: Every day | ORAL | 2 refills | Status: DC
Start: 2020-11-12 — End: 2021-02-11

## 2020-11-19 DIAGNOSIS — E1065 Type 1 diabetes mellitus with hyperglycemia: Secondary | ICD-10-CM | POA: Diagnosis not present

## 2020-11-20 ENCOUNTER — Ambulatory Visit
Admission: RE | Admit: 2020-11-20 | Discharge: 2020-11-20 | Disposition: A | Discharge status: Home / Self Care | Payer: Medicare PPO | Source: Ambulatory Visit | Attending: Neurology | Admitting: Neurology

## 2020-11-20 ENCOUNTER — Other Ambulatory Visit: Payer: Self-pay

## 2020-11-20 DIAGNOSIS — I6782 Cerebral ischemia: Secondary | ICD-10-CM | POA: Diagnosis not present

## 2020-11-20 DIAGNOSIS — R4789 Other speech disturbances: Secondary | ICD-10-CM

## 2020-11-20 DIAGNOSIS — R270 Ataxia, unspecified: Secondary | ICD-10-CM | POA: Diagnosis not present

## 2020-11-20 DIAGNOSIS — R2689 Other abnormalities of gait and mobility: Secondary | ICD-10-CM

## 2020-11-20 IMAGING — MR MR HEAD W/O CM
10 series · 48 of 48 positions shown · non-contrast
Comparison: [DATE].

CLINICAL DATA: Ataxia, nontraumatic, stroke excluded

EXAM:
MRI HEAD WITHOUT CONTRAST
TECHNIQUE: Multiplanar, multiecho pulse sequences of the brain and surrounding
structures were obtained without intravenous contrast.

[Series 10: T1 · sagittal · 4.0mm · 0.75mm/px · 1 of 31 slices shown]
[im 1/31]
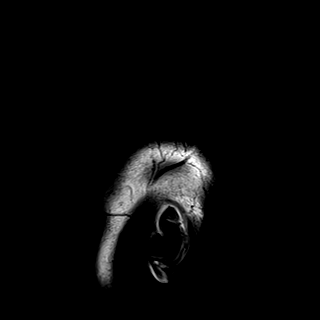

[Series 11: DWI · axial · 3.0mm · 0.94mm/px · z∈[-63,+88]mm · 11 of 191 slices shown (1 of 2)]
[im 1/191]
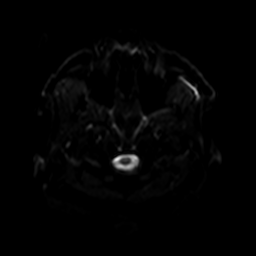
[im 20/191]
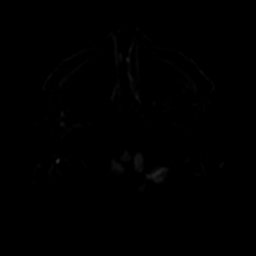
[im 39/191]
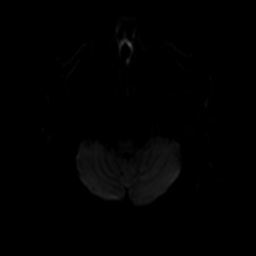
[im 58/191]
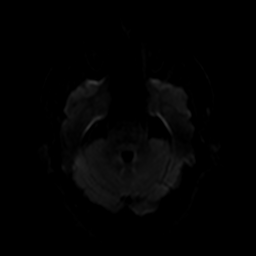
[im 77/191]
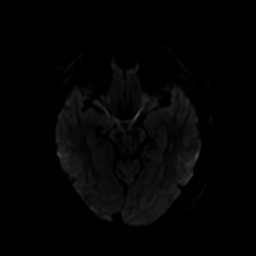
[im 96/191]
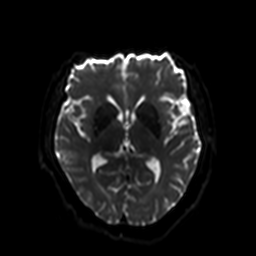
[im 115/191]
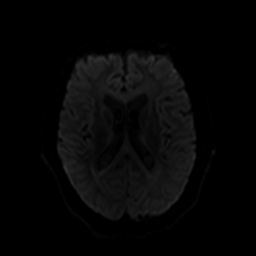
[im 134/191]
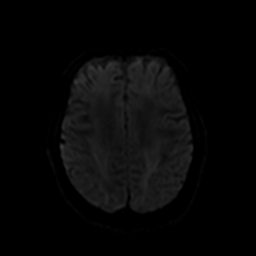
[im 153/191]
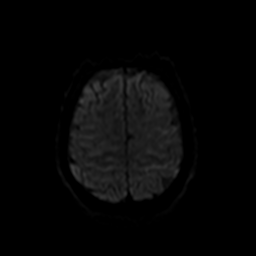
[im 172/191]
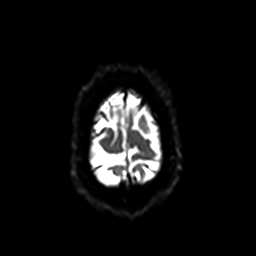
[im 191/191]
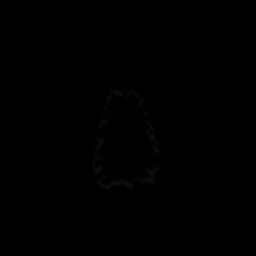

[Series 12: ax dwi_tracew · axial · 3.0mm · 0.94mm/px · z∈[-63,+88]mm · 6 of 96 slices shown]
[im 1/96]
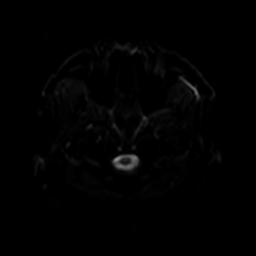
[im 20/96]
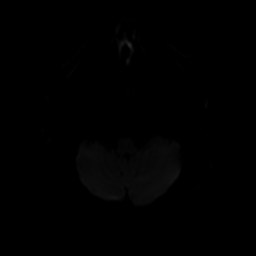
[im 39/96]
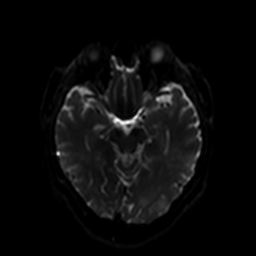
[im 58/96]
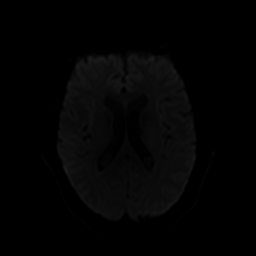
[im 77/96]
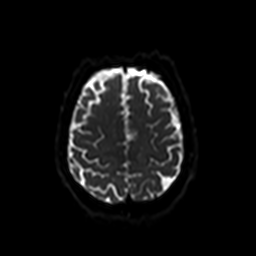
[im 96/96]
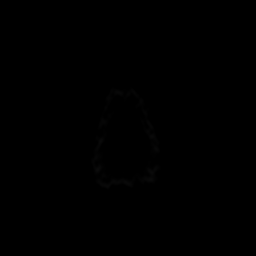

[Series 13: ax dwi_adc · axial · 3.0mm · 0.94mm/px · z∈[-63,+88]mm · 3 of 46 slices shown]
[im 1/46]
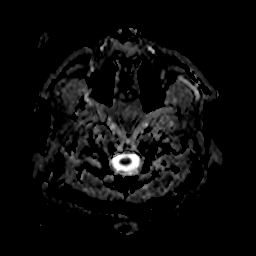
[im 23/46]
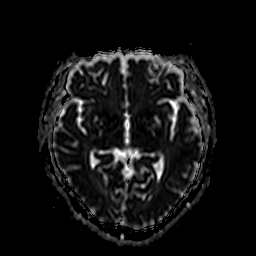
[im 46/46]
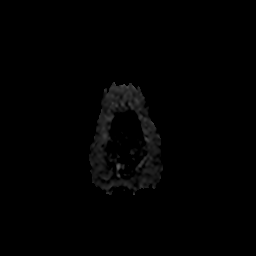

[Series 14: DWI · coronal · 4.0mm · 1.02mm/px · 10 of 164 slices shown (2 of 2)]
[im 1/164]
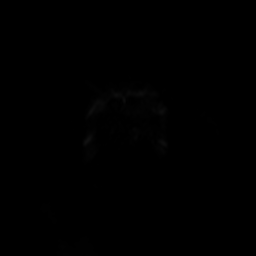
[im 19/164]
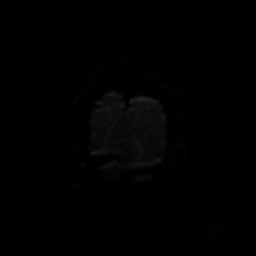
[im 37/164]
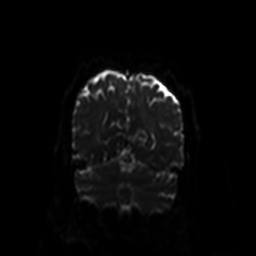
[im 55/164]
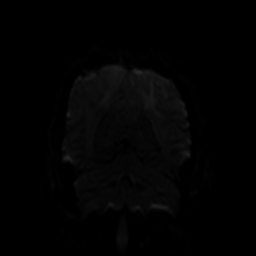
[im 73/164]
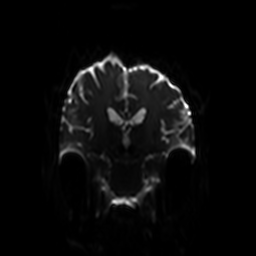
[im 91/164]
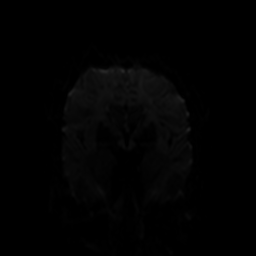
[im 109/164]
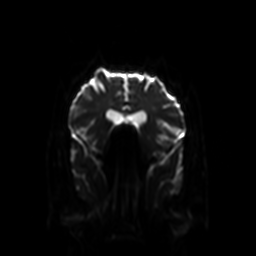
[im 127/164]
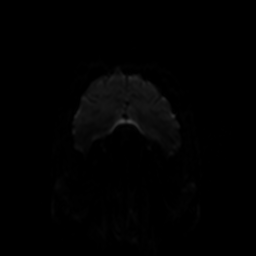
[im 145/164]
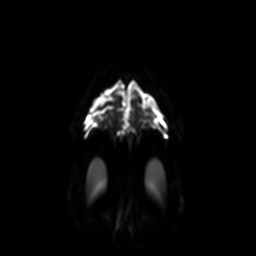
[im 164/164]
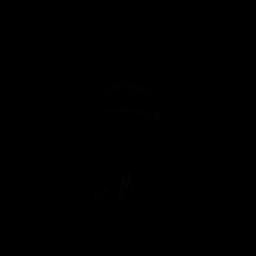

[Series 15: cor dwi_tracew · coronal · 4.0mm · 1.02mm/px · 5 of 82 slices shown]
[im 1/82]
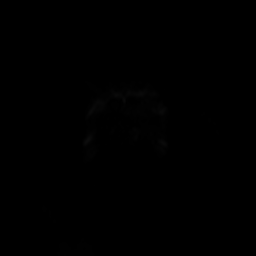
[im 21/82]
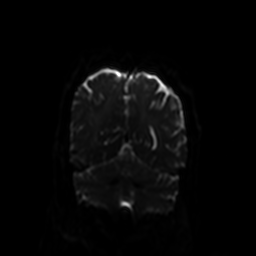
[im 41/82]
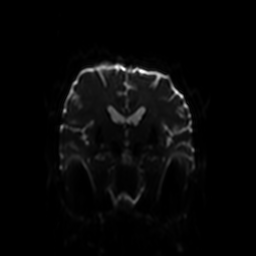
[im 61/82]
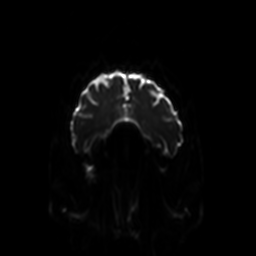
[im 82/82]
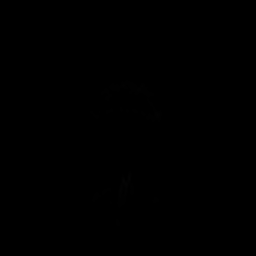

[Series 17: T2 · axial · 4.0mm · 0.36mm/px · z∈[-67,+86]mm · 2 of 31 slices shown (1 of 2)]
[im 1/31]
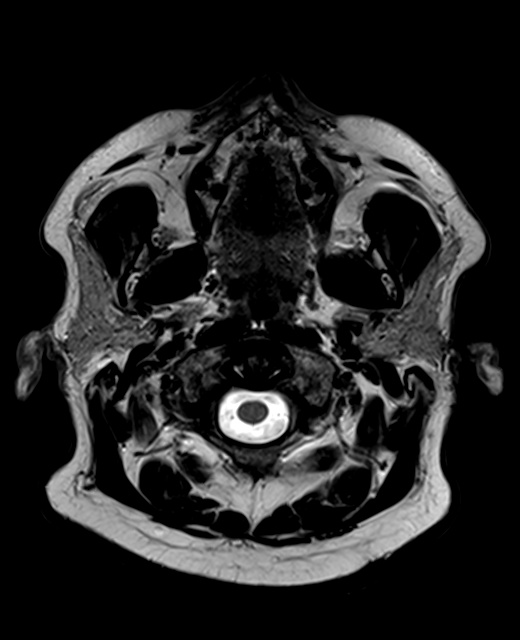
[im 31/31]
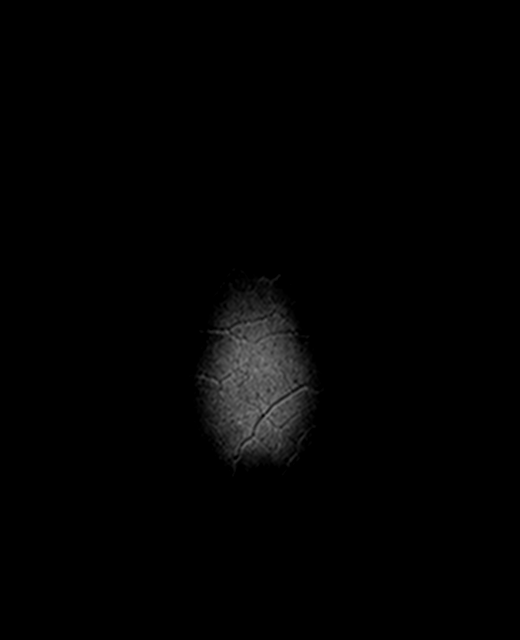

[Series 18: FLAIR · axial · 3.0mm · 0.72mm/px · z∈[-64,+84]mm · 2 of 26 slices shown]
[im 1/26]
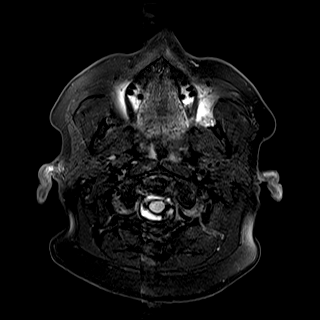
[im 26/26]
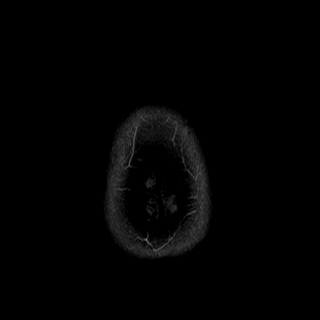

[Series 20: swi_images · axial · 1.5mm · 0.90mm/px · z∈[-61,+79]mm · 6 of 96 slices shown]
[im 1/96]
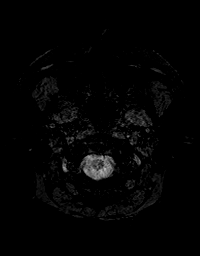
[im 20/96]
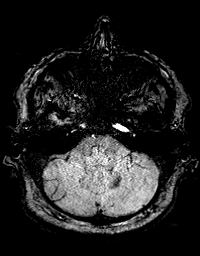
[im 39/96]
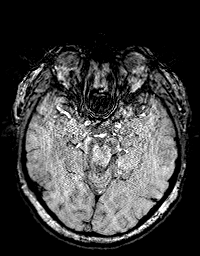
[im 58/96]
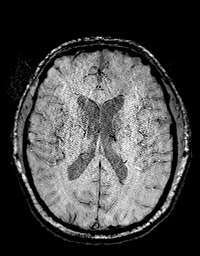
[im 77/96]
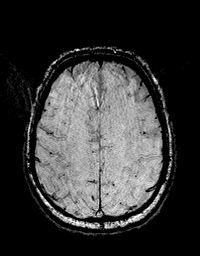
[im 96/96]
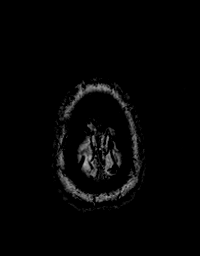

[Series 22: T2 · coronal · 4.5mm · 0.36mm/px · 2 of 31 slices shown (2 of 2)]
[im 1/31]
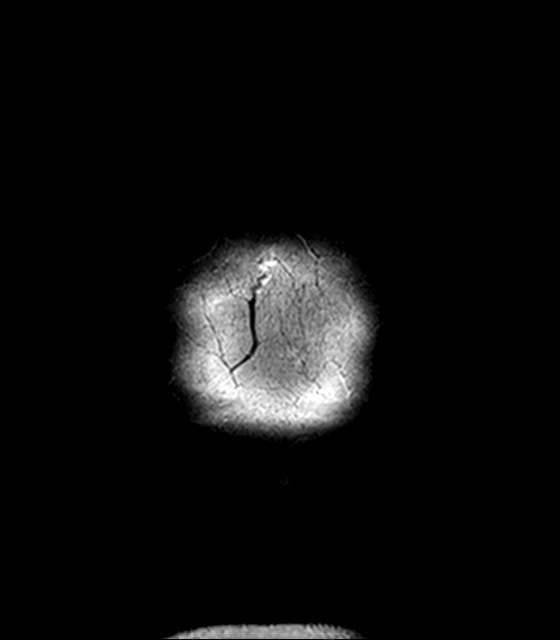
[im 31/31]
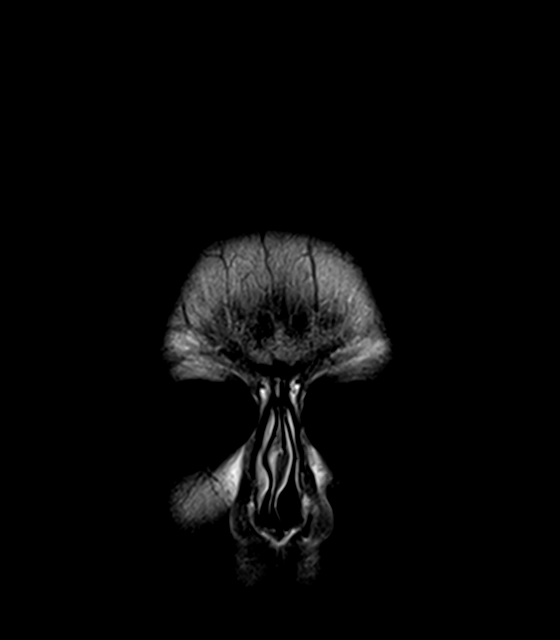

[48 of 48 positions shown; findings below may reference images not displayed]

FINDINGS: Brain: No diffusion-weighted signal abnormality. No intracranial
hemorrhage. No midline shift, ventriculomegaly or extra-axial fluid
collection. No mass lesion. Sequela of small remote inferior right
frontal insult. Cerebral volume is within normal limits. Minimal
chronic microvascular ischemic changes.

Vascular: Normal flow voids.

Skull and upper cervical spine: Normal marrow signal.

Sinuses/Orbits: Normal orbits. Clear paranasal sinuses. No mastoid
effusion.

Other: None.
IMPRESSION: No acute intracranial process. Remote inferior right frontal insult.

Minimal chronic microvascular ischemic changes.

## 2020-11-21 ENCOUNTER — Telehealth: Payer: Self-pay

## 2020-11-21 NOTE — Telephone Encounter (Signed)
Pt called phone busy

## 2020-11-21 NOTE — Telephone Encounter (Signed)
-----   Message from Adam R Jaffe, DO sent at 11/21/2020  6:53 AM EST ----- MRI of brain shows no acute findings.  There is chronic age-related changes.  There is a small old stroke but this is an incidental finding and would not explain his symptoms. Would not be able to determine when it occurred but it is old.  In this situation, we recommend just continuing aspirin and medications for blood pressure and cholesterol.   

## 2020-11-29 ENCOUNTER — Ambulatory Visit: Payer: Medicare PPO | Admitting: Psychology

## 2020-11-29 ENCOUNTER — Other Ambulatory Visit: Payer: Self-pay

## 2020-11-29 ENCOUNTER — Encounter: Payer: Self-pay | Admitting: Psychology

## 2020-11-29 ENCOUNTER — Ambulatory Visit (INDEPENDENT_AMBULATORY_CARE_PROVIDER_SITE_OTHER): Payer: Medicare PPO | Admitting: Psychology

## 2020-11-29 DIAGNOSIS — R4189 Other symptoms and signs involving cognitive functions and awareness: Secondary | ICD-10-CM | POA: Diagnosis not present

## 2020-11-29 DIAGNOSIS — F330 Major depressive disorder, recurrent, mild: Secondary | ICD-10-CM | POA: Diagnosis not present

## 2020-11-29 DIAGNOSIS — F411 Generalized anxiety disorder: Secondary | ICD-10-CM

## 2020-11-29 DIAGNOSIS — F329 Major depressive disorder, single episode, unspecified: Secondary | ICD-10-CM | POA: Insufficient documentation

## 2020-11-29 DIAGNOSIS — G4733 Obstructive sleep apnea (adult) (pediatric): Secondary | ICD-10-CM

## 2020-11-29 DIAGNOSIS — E1142 Type 2 diabetes mellitus with diabetic polyneuropathy: Secondary | ICD-10-CM | POA: Diagnosis not present

## 2020-11-29 NOTE — Progress Notes (Signed)
   Psychometrician Note   Cognitive testing was administered to Paul Webb by Wallace Keller, B.S. (psychometrist) under the supervision of Dr. Newman Nickels, Ph.D., licensed psychologist on 11/29/20. Paul Webb did not appear overtly distressed by the testing session per behavioral observation or responses across self-report questionnaires. Dr. Newman Nickels, Ph.D. checked in with Paul Webb as needed to manage any distress related to testing procedures (if applicable). Rest breaks were offered.    The battery of tests administered was selected by Dr. Newman Nickels, Ph.D. with consideration to Paul Webb current level of functioning, the nature of his symptoms, emotional and behavioral responses during interview, level of literacy, observed level of motivation/effort, and the nature of the referral question. This battery was communicated to the psychometrist. Communication between Dr. Newman Nickels, Ph.D. and the psychometrist was ongoing throughout the evaluation and Dr. Newman Nickels, Ph.D. was immediately accessible at all times. Dr. Newman Nickels, Ph.D. provided supervision to the psychometrist on the date of this service to the extent necessary to assure the quality of all services provided.    Paul Webb will return within approximately 1-2 weeks for an interactive feedback session with Dr. Milbert Coulter at which time his test performances, clinical impressions, and treatment recommendations will be reviewed in detail. Paul Webb understands he can contact our office should he require our assistance before this time.  A total of 145 minutes of billable time were spent face-to-face with Paul Webb by the psychometrist. This includes both test administration and scoring time. Billing for these services is reflected in the clinical report generated by Dr. Newman Nickels, Ph.D..  This note reflects time spent with the psychometrician and does not include test scores or any clinical  interpretations made by Dr. Milbert Coulter. The full report will follow in a separate note.

## 2020-11-29 NOTE — Progress Notes (Signed)
NEUROPSYCHOLOGICAL EVALUATION [place]. [place] Department of Neurology  Date of Evaluation: November 29, 2020  Reason for Referral:   Paul Webb is a 65 y.o. right-handed Caucasian male referred by Metta Clines, D.O., to characterize his current cognitive functioning and assist with diagnostic clarity and treatment planning in the context of subjective cognitive decline and balance concerns.   Assessment and Plan:   Clinical Impression(s): Paul Webb' pattern of performance is suggestive of neuropsychological functioning largely within normal limits. A relative weakness was exhibited across retrieval aspects of memory. However, other aspects such as encoding (i.e., learning) and consolidation were appropriate. Mild performance variability was further noted across response inhibition. Performance across other assessed cognitive domains was appropriate. This includes processing speed, attention/concentration, other aspects of executive functioning, receptive and expressive language, and visuospatial abilities. Paul Webb denied difficulties completing instrumental activities of daily living (ADLs) independently. I do not feel that current relative weaknesses arise to where a diagnosis of a mild neurocognitive disorder is warranted at the present time.   The etiology of said weaknesses along with day-to-day subjective complaints is likely multifactorial in nature. Paul Webb reported acute symptoms of mild anxiety and depression. Ongoing psychiatric conditions can certainly influence cognitive efficiency, especially across areas where weaknesses emerged. This is also true of his various medical ailments, including diabetes, as well as untreated obstructive sleep apnea. Despite neuroimaging suggesting a right frontal insult, testing was not localizing in that it did not suggest ongoing right frontal dysfunction. Mild bilateral tremors were apparent which could raise concerns for  essential tremor or something on the Parkinson's disease spectrum. However, regarding the latter, his current cognitive profile is not suggestive of frontal subcortical dysfunction or visuospatial deficits which commonly accompany this condition. Symptoms are likely more related to neuropathy or other medical conditions. Additionally, despite a relative weakness in retrieval aspects of memory, overall performances are not consistent with Alzheimer's disease. Overall, the most likely culprit for ongoing cognitive weakness at the present time appears to be a combination of mood, sleep, and medical comorbidities. Continued medical monitoring will be important moving forward.   Recommendations: Should Paul Webb and/or his wife express concerns surrounding cognitive/functional decline in the future, a repeat neuropsychological evaluation would be warranted at that time. The current evaluation will serve as an excellent baseline to compare future evaluations against.  He reported numerous attempts to treat obstructive sleep apnea with various CPAP masks. However, these have always been unsuccessful due to symptoms of claustrophobia. I would encourage Paul Webb to assure himself that he has tried all potential treatments for this condition. Untreated sleep apnea will increase his risk for heart attack, stroke, and future cognitive decline.   A combination of medication and psychotherapy has been shown to be most effective at treating symptoms of anxiety and depression. As such, Paul Webb is encouraged to speak with his prescribing physician regarding medication adjustments to optimally manage these symptoms. Likewise, Paul Webb is encouraged to consider engaging in short-term psychotherapy to address symptoms of psychiatric distress. He would benefit from an active and collaborative therapeutic environment, rather than one purely supportive in nature. Recommended treatment modalities include Cognitive Behavioral  Therapy (CBT) or Acceptance and Commitment Therapy (ACT).  Paul Webb is encouraged to attend to lifestyle factors for brain health (e.g., regular physical exercise, good nutrition habits, regular participation in cognitively-stimulating activities, and general stress management techniques), which are likely to have benefits for both emotional adjustment and cognition. In fact, in addition to  promoting good general health, regular exercise incorporating aerobic activities (e.g., brisk walking, jogging, cycling, etc.) has been demonstrated to be a very effective treatment for depression and stress, with similar efficacy rates to both antidepressant medication and psychotherapy. Optimal control of vascular risk factors (including safe cardiovascular exercise and adherence to dietary recommendations) is encouraged.   If interested, there are some activities which have therapeutic value and can be useful in keeping him cognitively stimulated. For suggestions, Paul Webb is encouraged to go to the following website: https://www.[facility].org/get-to-know-barrow/centers-programs/neurorehabilitation-center/neuro-rehab-apps-and-games/ which has options, categorized by level of difficulty. It should be noted that these activities should not be viewed as a substitute for therapy.  When learning new information, he would benefit from information being broken up into small, manageable pieces. He may also find it helpful to articulate the material in his own words and in a context to promote encoding at the onset of a new task. This material may need to be repeated multiple times to promote encoding.  Memory can be improved using internal strategies such as rehearsal, repetition, chunking, mnemonics, association, and imagery. External strategies such as written notes in a consistently used memory journal, visual and nonverbal auditory cues such as a calendar on the refrigerator or appointments with alarm, such as on a  cell phone, can also help maximize recall.    To address problems with fluctuating attention, he may wish to consider:   -Avoiding external distractions when needing to concentrate   -Limiting exposure to fast paced environments with multiple sensory demands   -Writing down complicated information and using checklists   -Attempting and completing one task at a time (i.e., no multi-tasking)   -Verbalizing aloud each step of a task to maintain focus   -Reducing the amount of information considered at one time  Review of Records:   Mr. Northcutt was seen by [facility] Metta Clines, D.O.) on 10/29/2020 for an evaluation of memory and balance concerns. He reported trouble with recall and has been making paraphasic errors while speaking. For example, if he asks for a glove, he may instead say "hand me the stethoscope." He does sometimes seem to have word-finding difficulty in that he searches for words as well. Balance issues have been ongoing for the past year. He did not report trouble walking on even ground; rather, difficulties occur when walking on uneven ground and he may stumble. He has had about five falls over the past 1.5 years. One of these falls included falling off of a ladder. Medical history is positive for diabetes mellitus with an insulin pump. His blood sugars have not been low. Labs from August 2021 include B12 500; TSH 1.720; CMP with Na 141, K 4.1, Cl 101, CO2 26, Ca 9.1, glucose 196, BUN 27, Cr 1.12, t bili 0.4, ALP 101, AST 13, ALT 14.  He has had uncontrolled diabetes (8) until approximately 3-4 months prior when it was in the 6-7 range. There is also a history of depression and anxiety, for which he takes Abilify, Wellbutrin XL, sertraline, and hydroxyzine.  For insomnia, he takes trazodone. There is a further history of obstructive sleep apnea; however, he has been unable to tolerate a CPAP mask. Performance on a brief cognitive screening instrument (SLUMS) was 26/30. Ultimately, Mr.  Holthusen was referred for a comprehensive neuropsychological evaluation to characterize his cognitive abilities and to assist with diagnostic clarity and treatment planning.   Brain MRI on 11/20/2020 revealed minimal chronic small vessel ischemia, as well as a remote inferior right  frontal insult (possibly representing a lacunar infarct).   Past Medical History:  Diagnosis Date  . 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 Essential hypertension 07/30/2012  . Fast heart beat   . Gastro-esophageal reflux disease with esophagitis 07/30/2012  . Generalized anxiety disorder 05/12/2017  . History of kidney stones   . Hyperlipidemia 07/30/2012  . Major depressive disorder   . OSA (obstructive sleep apnea)   . Type 2 diabetes mellitus 07/30/2012    Past Surgical History:  Procedure Laterality Date  . COLONOSCOPY    . COLONOSCOPY N/A 10/06/2018   Procedure: COLONOSCOPY;  Surgeon: Rogene Houston, MD;  Location: [facility];  Service: Endoscopy;  Laterality: N/A;  1:25  . FL INJ RT KNEE CT ARTHROGRAM ([facility] HX)    . POLYPECTOMY  10/06/2018   Procedure: POLYPECTOMY;  Surgeon: Rogene Houston, MD;  Location: [facility];  Service: Endoscopy;;  colon   . Rod in lower leg     trauma  . Rt hernia repair      Current Outpatient Medications:  .  ARIPiprazole (ABILIFY) 2 MG tablet, Take 1 tablet (2 mg total) by mouth daily., Disp: 30 tablet, Rfl: 2 .  aspirin EC 81 MG tablet, Take 1 tablet (81 mg total) by mouth daily., Disp: , Rfl:  .  atenolol (TENORMIN) 50 MG tablet, Take 50 mg by mouth at bedtime. , Disp: , Rfl:  .  Bioflavonoid Products (BIOFLEX PO), Take by mouth., Disp: , Rfl:  .  buPROPion (WELLBUTRIN XL) 300 MG 24 hr tablet, Take 1 tablet (300 mg total) by mouth daily., Disp: 30 tablet, Rfl: 2 .  cetirizine (ZYRTEC) 10 MG tablet, Take 10 mg by mouth at bedtime. , Disp: , Rfl:  .  Cholecalciferol (VITAMIN D) 2000 units tablet, Take 2,000 Units by mouth daily., Disp: , Rfl:  .   fluticasone furoate-vilanterol (BREO ELLIPTA) 100-25 MCG/INH AEPB, Inhale 1 puff into the lungs daily as needed (shortness of breath)., Disp: , Rfl:  .  hydrOXYzine (ATARAX/VISTARIL) 25 MG tablet, Take 1 tablet (25 mg total) by mouth daily as needed for anxiety., Disp: 90 tablet, Rfl: 0 .  lisinopril-hydrochlorothiazide (PRINZIDE,ZESTORETIC) 20-25 MG tablet, Take 1 tablet by mouth daily., Disp: , Rfl:  .  Multiple Vitamin (MULTIVITAMIN) tablet, Take 1 tablet by mouth daily., Disp: , Rfl:  .  NOVOLOG 100 UNIT/ML injection, , Disp: , Rfl:  .  Omega-3 Fatty Acids (FISH OIL) 1000 MG CAPS, Take 1,000 mg by mouth daily. (Patient not taking: Reported on 10/29/2020), Disp: , Rfl:  .  omeprazole (PRILOSEC) 40 MG capsule, Take 40 mg by mouth daily., Disp: , Rfl:  .  pravastatin (PRAVACHOL) 80 MG tablet, Take 80 mg by mouth at bedtime. , Disp: , Rfl:  .  sertraline (ZOLOFT) 100 MG tablet, Take 1.5 tablets (150 mg total) by mouth daily., Disp: 135 tablet, Rfl: 0 .  [START ON 02/04/2021] sertraline (ZOLOFT) 100 MG tablet, Take 1.5 tablets (150 mg total) by mouth daily., Disp: 45 tablet, Rfl: 0 .  SYMLINPEN 60 1500 MCG/1.5ML SOPN, , Disp: , Rfl:  .  traZODone (DESYREL) 100 MG tablet, Take 1 tablet (100 mg total) by mouth at bedtime as needed for sleep., Disp: 30 tablet, Rfl: 2  Clinical Interview:   The following information was obtained during a clinical interview with Mr. Bredehoft and his wife prior to cognitive testing.  Cognitive Symptoms: Decreased short-term memory: Largely denied. Concerns generally focused on word finding difficulties.  Decreased long-term  memory: Denied. Decreased attention/concentration: Endorsed. Difficulties primarily focused on sustained attention with Mr. Wegmann stating that his mind will often wander. Significant distractibility was not reported.  Reduced processing speed: Endorsed. His wife stated that he seems consistently foggy.  Difficulties with executive functions: Endorsed.  Trouble with organization was said to represent a newer symptom. He also reported indecision, attributed to diminished confidence in decision making abilities. No history of unsafe decisions or poor judgment was reported. Impulsivity and overt personality changes were denied.  Difficulties with emotion regulation: Denied. Difficulties with receptive language: Denied. Difficulties with word finding: Endorsed. His wife reported the presence of paraphasic errors where Mr. Stickels will incorrectly substitute one word for another while conversing. For example, he will say "hand me the remote control" when he is actually asking for his cup of coffee. This was said to have been going on for the past 6-12 months, will fluctuate in frequency, and happens at least daily.  Decreased visuoperceptual ability: Denied.  Difficulties completing ADLs: Denied.  Additional Medical History: History of traumatic brain injury/concussion: Denied. History of stroke: Prior neuroimaging suggested the potential for a remote right frontal insult. This was asymptomatic if it indeed represents a small stroke.  History of seizure activity: Denied. History of known exposure to toxins: Denied. Symptoms of chronic pain: Endorsed. He reported diffuse body pain with particular areas of emphasis surrounding his shoulders, elbows, and hands. Symptoms were attributed to osteoarthritis and were described as manageable overall. Over-the-counter medications are used as necessary and are generally effective.  Experience of frequent headaches/migraines: Denied. Frequent instances of dizziness/vertigo: Endorsed. He reported commonly feeling dizzy upon quickly standing, as well as like he is "teetering."   Sensory changes: He wears glasses with positive effect. His wife reported the potential for mild hearing loss. He has not had a formal hearing evaluation. Other sensory changes/difficulties (e.g., taste or smell) were denied.   Balance/coordination difficulties: Endorsed. He reported feeling unsteady on his feet, especially when walking on uneven surfaces or down steeper inclines. Vertigo symptoms were denied and one side of the body was not said to be worse than the other. He acknowledged a handful of falls in the past 1-2 years, including one where he fell a significant distance off a ladder. Significant injuries from these falls were not endorsed outside of potential nerve damage in his left arm. Dr. Everlena Cooper expressed suspicions that balance concerns were due to diabetic-related peripheral neuropathy.  Other motor difficulties: Endorsed. He reported tremulous behaviors bilaterally, generally when performing fine motor actions. For example, his wife reported that he has trouble using a screwdriver while working. However, actions like holding a cup of coffee were not said to elicit a significant tremor. Symptoms were said to be ongoing for the past year and have potentially gotten mildly worse over time.  Sleep History: Estimated hours obtained each night: 8-9 hours. Difficulties falling asleep: Denied. Difficulties staying asleep: Denied. Feels rested and refreshed upon awakening: Denied.  History of snoring: Endorsed. History of waking up gasping for air: Endorsed. Witnessed breath cessation while asleep: Endorsed. He acknowledged a history of obstructive sleep apnea. He reported trialing numerous CPAP masks but found them all to be ineffective largely due to symptoms of claustrophobia. As such, this condition appears largely untreated at present.   History of vivid dreaming: Denied. Excessive movement while asleep: Endorsed. His wife mentioned a seemingly longstanding history of infrequent kicking while asleep. She wondered about this potentially representing restless leg syndrome.  Instances of acting out his  dreams: Denied.  Psychiatric/Behavioral Health History: Depression: Endorsed. He reported a longstanding  history of depression dating back 30 years following the passing of his first wife. He denied a history of psychiatric hospitalizations or suicidal ideation, intent, or plan. He currently works with a Teacher, music but denied involvement with a psychologist/therapist. He described his current mood as "pretty good." His wife reported that a recent medication change to Zoloft has been very effective.  Anxiety: Endorsed. Symptoms of anxiety were said to be generalized in nature and largely coincide with symptoms of depression. Medical records also suggest a history of a potential panic attack.  Mania: Denied. Trauma History: Denied. Visual/auditory hallucinations: Denied. Delusional thoughts: Denied.  Tobacco: Denied. Alcohol: He denied current alcohol consumption as well as a history of problematic alcohol abuse or dependence.  Recreational drugs: Denied. Caffeine: He reported consuming a couple cups of coffee in the morning, as well as an occasional diet [employer]   Family History: Adopted: Yes  Family history unknown: Yes   This information was confirmed by Mr. Delangel. He did report discovering that his biological mother had a history of both diabetes and arthritis.   Academic/Vocational History: Highest level of educational attainment: 12 years. He graduated high school and described himself as an average (A/B, few C) Ship broker. He reported completing a few college courses but did not complete a year. Math, specifically algebra, was noted as a relative weakness across academic settings.  History of developmental delay: Denied. History of grade repetition: Denied. Enrollment in special education courses: Denied. History of LD/ADHD: Denied.  Employment: Retired. He previously worked as a Administrator. 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 He and his wife noted that he has seemed less confident in his ability to perform these actions lately.    Evaluation Results:   Behavioral Observations: Mr. Liberti was accompanied by his wife, arrived to his appointment on time, and was appropriately dressed and groomed. He appeared alert and oriented. Observed gait and station were within normal limits. Gross motor functioning appeared intact upon informal observation and no abnormal movements (e.g., tremors) were noted. These were observed to a mild extent while performing motorized tasks during testing (e.g., Block Design, Pegboard). His affect was generally relaxed and positive, but did range appropriately given the subject being discussed during the clinical interview or the task at hand during testing procedures. Spontaneous speech was fluent and word finding difficulties were not observed during the clinical interview. Thought processes were coherent, organized, and normal in content. He required some instruction clarification across more complex tasks (e.g., Design Fluency, WCST). Insight into his cognitive difficulties appeared adequate. During testing, sustained attention was appropriate. Task engagement was adequate and he persisted when challenged. Overall, Mr. Barfuss was cooperative with the clinical interview and subsequent testing procedures.   Adequacy of Effort: The validity of neuropsychological testing is limited by the extent to which the individual being tested may be assumed to have exerted adequate effort during testing. Mr. Dressler expressed his intention to perform to the best of his abilities and exhibited adequate task engagement and persistence. Scores across stand-alone and embedded performance validity measures were within expectation. As such, the results of the current evaluation are believed to be a valid representation of Mr. Juris' current cognitive functioning.  Test Results: Mr. Georger was fully oriented at the time of the current evaluation.  Intellectual abilities based upon educational and vocational attainment were  estimated to be in the average range. Premorbid abilities  were estimated to be within the upper limits of the below average range based upon a single-word reading test.   Processing speed was average to above average. Basic attention was average. More complex attention (e.g., working memory) was also average. Executive functioning was largely average. However, some performance variability was noted across a task assessing response inhibition.  Assessed receptive language abilities were average. Likewise, Mr. Bullen did not exhibit any difficulties comprehending task instructions and answered all questions asked of him appropriately. Assessed expressive language (e.g., verbal fluency and confrontation naming) was average.     Assessed visuospatial/visuoconstructional abilities were largely average. His below average performance across Block Design was likely influenced by tremulous behaviors in his hands bilaterally.   Fine motor coordination/speed was well below average bilaterally, directly influenced by ongoing tremulous symptoms. Basic motor speed was below average bilaterally.    Learning (i.e., encoding) of novel verbal and visual information was below average to average. Spontaneous delayed recall (i.e., retrieval) of previously learned information was well below average to below average. Retention rates were 69% across a story learning task, 75% across a list learning task, and 50% across a shape learning task. Performance across recognition tasks was below average to average, suggesting evidence for information consolidation.   Results of emotional screening instruments suggested that recent symptoms of generalized anxiety were in the mild range, while symptoms of depression were also within the mild range. A screening instrument assessing recent sleep quality suggested the presence of minimal sleep dysfunction.  Tables of Scores:   Note: This summary of test scores accompanies the  interpretive report and should not be considered in isolation without reference to the appropriate sections in the text. Descriptors are based on appropriate normative data and may be adjusted based on clinical judgment. The terms "impaired" and "within normal limits (WNL)" are used when a more specific level of functioning cannot be determined.       Effort Testing:   DESCRIPTOR       ACS Word Choice: --- --- Within Expectation  Dot Counting Test: --- --- Within Expectation  NAB EVI: --- --- Within Expectation  D-KEFS Color Word Effort Index: --- --- Within Expectation       Orientation:      Raw Score Percentile   NAB Orientation, Form 1 29/29 --- ---       Cognitive Screening:           Raw Score Percentile   SLUMS: 26/30 --- ---       Intellectual Functioning:           Standard Score Percentile   Test of Premorbid Functioning: 89 23 Below Average       Memory:          NAB Memory Module, Form 1: T Score Percentile   List Learning       Total Trials 1-3 14/36 (38) 12 Below Average    List B 2/12 (38) 12 Below Average    Short Delay Free Recall 4/12 (39) 14 Below Average    Long Delay Free Recall 3/12 (36) 8 Well Below Average    Retention Percentage 75 (41) 18 Below Average    Recognition Discriminability 7 (47) 38 Average  Shape Learning       Total Trials 1-3 17/27 (56) 73 Average    Delayed Recall 4/9 (43) 25 Average    Retention Percentage 50 (33) 5 Well Below Average    Recognition Discriminability 5 (42) 21 Below Average  Story  Learning       Immediate Recall 42/80 (38) 12 Below Average    Delayed Recall 18/40 (36) 8 Well Below Average    Retention Percentage 69 (34) 5 Well Below Average  Daily Living Memory       Immediate Recall 38/51 (46) 34 Average    Delayed Recall 11/17 (42) 21 Below Average    Retention Percentage 73 (39) 14 Below Average    Recognition Hits 9/10 (51) 54 Average       Attention/Executive Function:          Trail Making Test (TMT):  Raw Score (T Score) Percentile     Part A 33 secs.,  0 errors (51) 54 Average    Part B 82 secs.,  0 errors (50) 50 Average         Scaled Score Percentile   WAIS-IV Coding: 7 16 Below Average       NAB Attention Module, Form 1: T Score Percentile     Digits Forward 48 42 Average    Digits Backwards 45 31 Average       D-KEFS Color-Word Interference Test: Raw Score (Scaled Score) Percentile     Color Naming 32 secs. (10) 50 Average    Word Reading 21 secs. (12) 75 Above Average    Inhibition 97 secs. (5) 5 Well Below Average      Total Errors 5 errors (6) 9 Below Average    Inhibition/Switching 75 secs. (10) 50 Average      Total Errors 2 errors (11) 63 Average       D-KEFS Verbal Fluency Test: Raw Score (Scaled Score) Percentile     Letter Total Correct 32 (9) 37 Average    Category Total Correct 38 (11) 63 Average    Category Switching Total Correct 13 (11) 63 Average    Category Switching Accuracy 11 (10) 50 Average      Total Set Loss Errors 1 (11) 63 Average      Total Repetition Errors 3 (10) 50 Average       D-KEFS Design Fluency Test: Raw Score (Scaled Score) Percentile     Condition 1 - Filled Dots 7 (9) 37 Average    Condition 2 - Empty Dots 6 (7) 16 Below Average    Condition 3 - Switching 5 (9) 37 Average      Total Set Loss Errors 4 (9) 37 Average      Total Repetition Errors 3 (12) 75 Above Average       [employer] Test: Raw Score Percentile     Categories (trials) 3 (64) >16 Within Normal Limits    Total Errors 14 66 Average    Perseverative Errors 9 50 Average    Non-Perseverative Errors 5 46 Average    Failure to Maintain Set 1 --- ---       Language:          Verbal Fluency Test: Raw Score (T Score) Percentile     Phonemic Fluency (FAS) 32 (44) 27 Average    Animal Fluency 19 (52) 58 Average        NAB Language Module, Form 1: T Score Percentile     Naming 31/31 (56) 73 Average       Visuospatial/Visuoconstruction:      Raw Score  Percentile   Clock Drawing: 9/10 --- Within Normal Limits       NAB Spatial Module, Form 1: T Score Percentile     Figure Drawing Copy  53 62 Average        Scaled Score Percentile   WAIS-IV Block Design: 7 16 Below Average       Sensory-Motor:          [place] Grooved Pegboard Test: Raw Score Percentile     Dominant Hand 128 secs.,  2 drops  2 Well Below Average    Non-Dominant Hand 117 secs.,  0 drops  5 Well Below Average       Finger Tapping Test: Mean Percentile     Dominant Hand 45 14 Below Average    Non-Dominant Hand 40 14 Below Average       Mood and Personality:      Raw Score Percentile   Beck Depression Inventory - II: 18 --- Mild  PROMIS Anxiety Questionnaire: 18 --- Mild       Additional Questionnaires:      Raw Score Percentile   PROMIS Sleep Disturbance Questionnaire: 12 --- None to Slight   Informed Consent and Coding/Compliance:   Mr. Valles was provided with a verbal description of the nature and purpose of the present neuropsychological evaluation. Also reviewed were the foreseeable risks and/or discomforts and benefits of the procedure, limits of confidentiality, and mandatory reporting requirements of this provider. The patient was given the opportunity to ask questions and receive answers about the evaluation. Oral consent to participate was provided by the patient.   This evaluation was conducted by Christia Reading, Ph.D., licensed clinical neuropsychologist. Mr. Fosnight completed a comprehensive clinical interview with Dr. Melvyn Novas, billed as one unit [phone], and 145 minutes of cognitive testing and scoring, billed as one unit [phone] and four additional units [phone]. Psychometrist Milana Kidney, B.S., assisted Dr. Melvyn Novas with test administration and scoring procedures. As a separate and discrete service, Dr. Melvyn Novas spent a total of 120 minutes in interpretation and report writing billed as one unit [phone] and one unit [phone].

## 2020-12-03 ENCOUNTER — Telehealth: Payer: Self-pay | Admitting: *Deleted

## 2020-12-03 NOTE — Telephone Encounter (Signed)
Read the note from Dr. Tomi Likens to Santiago Glad, Mr. Sliker' wife, and she was appreciative and said Dr. Melvyn Novas had gone over the test results when he came in for that appointment.

## 2020-12-03 NOTE — Telephone Encounter (Signed)
-----   Message from Pieter Partridge, DO sent at 11/21/2020  6:53 AM EST ----- MRI of brain shows no acute findings.  There is chronic age-related changes.  There is a small old stroke but this is an incidental finding and would not explain his symptoms. Would not be able to determine when it occurred but it is old.  In this situation, we recommend just continuing aspirin and medications for blood pressure and cholesterol.

## 2020-12-06 ENCOUNTER — Ambulatory Visit (INDEPENDENT_AMBULATORY_CARE_PROVIDER_SITE_OTHER): Payer: Medicare PPO | Admitting: Psychology

## 2020-12-06 ENCOUNTER — Other Ambulatory Visit: Payer: Self-pay

## 2020-12-06 DIAGNOSIS — F330 Major depressive disorder, recurrent, mild: Secondary | ICD-10-CM | POA: Diagnosis not present

## 2020-12-06 DIAGNOSIS — R4189 Other symptoms and signs involving cognitive functions and awareness: Secondary | ICD-10-CM | POA: Diagnosis not present

## 2020-12-06 DIAGNOSIS — G4733 Obstructive sleep apnea (adult) (pediatric): Secondary | ICD-10-CM

## 2020-12-06 DIAGNOSIS — F411 Generalized anxiety disorder: Secondary | ICD-10-CM

## 2020-12-06 NOTE — Patient Instructions (Signed)
Should [name] and/or his wife express concerns surrounding cognitive/functional decline in the future, a repeat neuropsychological evaluation would be warranted at that time. The current evaluation will serve as an excellent baseline to compare future evaluations against.  He reported numerous attempts to treat obstructive sleep apnea with various CPAP masks. However, these have always been unsuccessful due to symptoms of claustrophobia. I would encourage [name] to assure himself that he has tried all potential treatments for this condition. Untreated sleep apnea will increase his risk for heart attack, stroke, and future cognitive decline.   A combination of medication and psychotherapy has been shown to be most effective at treating symptoms of anxiety and depression. As such, [name] is encouraged to speak with his prescribing physician regarding medication adjustments to optimally manage these symptoms. Likewise, [name] is encouraged to consider engaging in short-term psychotherapy to address symptoms of psychiatric distress. He would benefit from an active and collaborative therapeutic environment, rather than one purely supportive in nature. Recommended treatment modalities include Cognitive Behavioral Therapy (CBT) or Acceptance and Commitment Therapy (ACT).  [name] is encouraged to attend to lifestyle factors for brain health (e.g., regular physical exercise, good nutrition habits, regular participation in cognitively-stimulating activities, and general stress management techniques), which are likely to have benefits for both emotional adjustment and cognition. In fact, in addition to promoting good general health, regular exercise incorporating aerobic activities (e.g., brisk walking, jogging, cycling, etc.) has been demonstrated to be a very effective treatment for depression and stress, with similar efficacy rates to both antidepressant medication and psychotherapy. Optimal control  of vascular risk factors (including safe cardiovascular exercise and adherence to dietary recommendations) is encouraged.   If interested, there are some activities which have therapeutic value and can be useful in keeping him cognitively stimulated. For suggestions, [name] is encouraged to go to the following website: https://www.[facility].org/get-to-know-barrow/centers-programs/neurorehabilitation-center/neuro-rehab-apps-and-games/ which has options, categorized by level of difficulty. It should be noted that these activities should not be viewed as a substitute for therapy.  When learning new information, he would benefit from information being broken up into small, manageable pieces. He may also find it helpful to articulate the material in his own words and in a context to promote encoding at the onset of a new task. This material may need to be repeated multiple times to promote encoding.  Memory can be improved using internal strategies such as rehearsal, repetition, chunking, mnemonics, association, and imagery. External strategies such as written notes in a consistently used memory journal, visual and nonverbal auditory cues such as a calendar on the refrigerator or appointments with alarm, such as on a cell phone, can also help maximize recall.    To address problems with fluctuating attention, he may wish to consider:   -Avoiding external distractions when needing to concentrate   -Limiting exposure to fast paced environments with multiple sensory demands   -Writing down complicated information and using checklists   -Attempting and completing one task at a time (i.e., no multi-tasking)   -Verbalizing aloud each step of a task to maintain focus   -Reducing the amount of information considered at one time

## 2020-12-06 NOTE — Progress Notes (Signed)
   Neuropsychology Feedback Session Tillie Rung. [place] Department of Neurology  Reason for Referral:   Paul Webb [facility] a 65 y.o. right-handed Caucasian male referred by Metta Clines, D.O.,to characterize hiscurrent cognitive functioning and assist with diagnostic clarity and treatment planning in the context of subjective cognitive decline and balance concerns.   Feedback:   Paul Webb completed a comprehensive neuropsychological evaluation on 11/29/2020. Please refer to that encounter for the full report and recommendations. Briefly, results suggested neuropsychological functioning largely within normal limits. A relative weakness was exhibited across retrieval aspects of memory. However, other aspects such as encoding (i.e., learning) and consolidation were appropriate. Mild performance variability was further noted across response inhibition. Overall, the most likely culprit for ongoing cognitive weakness at the present time appears to be a combination of mood, sleep, and medical comorbidities.   Paul Webb was accompanied by his wife during the current feedback session. Content of the current session focused on the results of his neuropsychological evaluation. Paul Webb and his wife were given the opportunity to ask questions and their questions were answered. They were encouraged to reach out should additional questions arise. A copy of his report was provided at the conclusion of the visit.      25 minutes were spent conducting the current feedback session with Paul Webb, billed as one unit [phone].

## 2020-12-14 DIAGNOSIS — E1165 Type 2 diabetes mellitus with hyperglycemia: Secondary | ICD-10-CM | POA: Diagnosis not present

## 2020-12-14 DIAGNOSIS — J069 Acute upper respiratory infection, unspecified: Secondary | ICD-10-CM | POA: Diagnosis not present

## 2020-12-14 DIAGNOSIS — F411 Generalized anxiety disorder: Secondary | ICD-10-CM | POA: Diagnosis not present

## 2020-12-14 DIAGNOSIS — J449 Chronic obstructive pulmonary disease, unspecified: Secondary | ICD-10-CM | POA: Diagnosis not present

## 2020-12-14 DIAGNOSIS — Z299 Encounter for prophylactic measures, unspecified: Secondary | ICD-10-CM | POA: Diagnosis not present

## 2020-12-19 DIAGNOSIS — U071 COVID-19: Secondary | ICD-10-CM | POA: Diagnosis not present

## 2020-12-19 DIAGNOSIS — F321 Major depressive disorder, single episode, moderate: Secondary | ICD-10-CM | POA: Diagnosis not present

## 2020-12-19 DIAGNOSIS — J449 Chronic obstructive pulmonary disease, unspecified: Secondary | ICD-10-CM | POA: Diagnosis not present

## 2020-12-19 DIAGNOSIS — E1165 Type 2 diabetes mellitus with hyperglycemia: Secondary | ICD-10-CM | POA: Diagnosis not present

## 2020-12-19 DIAGNOSIS — Z299 Encounter for prophylactic measures, unspecified: Secondary | ICD-10-CM | POA: Diagnosis not present

## 2020-12-24 DIAGNOSIS — E1065 Type 1 diabetes mellitus with hyperglycemia: Secondary | ICD-10-CM | POA: Diagnosis not present

## 2021-01-01 DIAGNOSIS — M9901 Segmental and somatic dysfunction of cervical region: Secondary | ICD-10-CM | POA: Diagnosis not present

## 2021-01-01 DIAGNOSIS — M531 Cervicobrachial syndrome: Secondary | ICD-10-CM | POA: Diagnosis not present

## 2021-01-01 DIAGNOSIS — M47812 Spondylosis without myelopathy or radiculopathy, cervical region: Secondary | ICD-10-CM | POA: Diagnosis not present

## 2021-01-03 DIAGNOSIS — M47812 Spondylosis without myelopathy or radiculopathy, cervical region: Secondary | ICD-10-CM | POA: Diagnosis not present

## 2021-01-03 DIAGNOSIS — M9901 Segmental and somatic dysfunction of cervical region: Secondary | ICD-10-CM | POA: Diagnosis not present

## 2021-01-03 DIAGNOSIS — M531 Cervicobrachial syndrome: Secondary | ICD-10-CM | POA: Diagnosis not present

## 2021-01-07 ENCOUNTER — Ambulatory Visit: Payer: Medicare PPO | Admitting: Orthopedic Surgery

## 2021-01-07 DIAGNOSIS — M47812 Spondylosis without myelopathy or radiculopathy, cervical region: Secondary | ICD-10-CM | POA: Diagnosis not present

## 2021-01-07 DIAGNOSIS — M9901 Segmental and somatic dysfunction of cervical region: Secondary | ICD-10-CM | POA: Diagnosis not present

## 2021-01-07 DIAGNOSIS — M531 Cervicobrachial syndrome: Secondary | ICD-10-CM | POA: Diagnosis not present

## 2021-01-07 DIAGNOSIS — G8929 Other chronic pain: Secondary | ICD-10-CM

## 2021-01-09 DIAGNOSIS — E291 Testicular hypofunction: Secondary | ICD-10-CM | POA: Diagnosis not present

## 2021-01-09 DIAGNOSIS — E559 Vitamin D deficiency, unspecified: Secondary | ICD-10-CM | POA: Diagnosis not present

## 2021-01-09 DIAGNOSIS — E785 Hyperlipidemia, unspecified: Secondary | ICD-10-CM | POA: Diagnosis not present

## 2021-01-09 DIAGNOSIS — E1065 Type 1 diabetes mellitus with hyperglycemia: Secondary | ICD-10-CM | POA: Diagnosis not present

## 2021-01-10 DIAGNOSIS — M47812 Spondylosis without myelopathy or radiculopathy, cervical region: Secondary | ICD-10-CM | POA: Diagnosis not present

## 2021-01-10 DIAGNOSIS — M9901 Segmental and somatic dysfunction of cervical region: Secondary | ICD-10-CM | POA: Diagnosis not present

## 2021-01-10 DIAGNOSIS — M531 Cervicobrachial syndrome: Secondary | ICD-10-CM | POA: Diagnosis not present

## 2021-01-15 DIAGNOSIS — J343 Hypertrophy of nasal turbinates: Secondary | ICD-10-CM | POA: Insufficient documentation

## 2021-01-15 DIAGNOSIS — J342 Deviated nasal septum: Secondary | ICD-10-CM | POA: Insufficient documentation

## 2021-01-15 DIAGNOSIS — G4733 Obstructive sleep apnea (adult) (pediatric): Secondary | ICD-10-CM | POA: Diagnosis not present

## 2021-01-15 DIAGNOSIS — M531 Cervicobrachial syndrome: Secondary | ICD-10-CM | POA: Diagnosis not present

## 2021-01-15 DIAGNOSIS — M47812 Spondylosis without myelopathy or radiculopathy, cervical region: Secondary | ICD-10-CM | POA: Diagnosis not present

## 2021-01-15 DIAGNOSIS — M9901 Segmental and somatic dysfunction of cervical region: Secondary | ICD-10-CM | POA: Diagnosis not present

## 2021-01-15 HISTORY — DX: Deviated nasal septum: J34.2

## 2021-01-15 HISTORY — DX: Hypertrophy of nasal turbinates: J34.3

## 2021-01-17 ENCOUNTER — Other Ambulatory Visit: Payer: Self-pay | Admitting: Otolaryngology

## 2021-01-21 DIAGNOSIS — M531 Cervicobrachial syndrome: Secondary | ICD-10-CM | POA: Diagnosis not present

## 2021-01-21 DIAGNOSIS — M9901 Segmental and somatic dysfunction of cervical region: Secondary | ICD-10-CM | POA: Diagnosis not present

## 2021-01-21 DIAGNOSIS — M47812 Spondylosis without myelopathy or radiculopathy, cervical region: Secondary | ICD-10-CM | POA: Diagnosis not present

## 2021-01-31 ENCOUNTER — Ambulatory Visit: Payer: Medicare PPO | Admitting: Orthopedic Surgery

## 2021-01-31 ENCOUNTER — Ambulatory Visit: Payer: Medicare PPO

## 2021-01-31 ENCOUNTER — Encounter: Payer: Self-pay | Admitting: Orthopedic Surgery

## 2021-01-31 ENCOUNTER — Other Ambulatory Visit: Payer: Self-pay

## 2021-01-31 VITALS — BP 154/84 | HR 67 | Ht 71.0 in | Wt 230.0 lb

## 2021-01-31 DIAGNOSIS — M75121 Complete rotator cuff tear or rupture of right shoulder, not specified as traumatic: Secondary | ICD-10-CM

## 2021-01-31 DIAGNOSIS — M12811 Other specific arthropathies, not elsewhere classified, right shoulder: Secondary | ICD-10-CM | POA: Diagnosis not present

## 2021-01-31 DIAGNOSIS — M25511 Pain in right shoulder: Secondary | ICD-10-CM

## 2021-01-31 DIAGNOSIS — G8929 Other chronic pain: Secondary | ICD-10-CM

## 2021-01-31 NOTE — Patient Instructions (Addendum)
While we are working on your approval for MRI please go ahead and call to schedule your appointment with [facility] within at least one (1) week.   Central Scheduling [phone]   Rotator Cuff Tear  A rotator cuff tear is a partial or complete tear of the cord-like bands (tendons) that connect muscle to bone in the rotator cuff. The rotator cuff is a group of muscles and tendons that surround the shoulder joint and keep the upper arm bone (humerus) in the shoulder socket. The tear can occur suddenly (acute tear) or can develop over a long period of time (chronic tear). What are the causes? Acute tears may be caused by:  A fall, especially on an outstretched arm.  Lifting very heavy objects with a jerking motion. Chronic tears may be caused by overuse of the muscles. This may happen in sports, physical work, or activities in which your arm repeatedly moves over your head. What increases the risk? This condition is more likely to occur in:  Athletes and workers who frequently use their shoulder or reach over their head. This may include activities such as: ? Tennis. ? Baseball and softball. ? Swimming and rowing. ? Weight lifting. ? Architect work. ? Painting.  People who smoke.  Older people who have arthritis or poor blood supply. These can make the muscles and tendons weaker. What are the signs or symptoms? Symptoms of this condition depend on the type and severity of the injury:  An acute tear may include a sudden tearing feeling, followed by severe pain that goes from your upper shoulder, down your arm, and toward your elbow.  A chronic tear includes a gradual weakness and decreased shoulder motion as the pain gets worse. The pain is usually worse at night. Both types may have symptoms such as:  Pain that spreads (radiates) from the shoulder to the upper arm.  Swelling and tenderness in front of the shoulder.  Decreased range of motion.  Pain  when: ? Reaching, pulling, or lifting the arm above the head. ? Lowering the arm from above the head.  Not being able to raise your arm out to the side.  Difficulty placing the arm behind your back. How is this diagnosed? This condition is diagnosed with a medical history and physical exam. Imaging tests may also be done, including:  X-rays.  MRI.  Ultrasound.  CT or MR arthrogram. During this test, a contrast material is injected into your shoulder, and then images are taken. How is this treated? Treatment for this condition depends on the type and severity of the condition. In less severe cases, treatment may include:  Rest. This may be done with a sling that holds the shoulder still (immobilization). Your health care provider may also recommend avoiding activities that involve lifting your arm over your head.  Icing the shoulder.  Anti-inflammatory medicines, such as aspirin or ibuprofen.  Strengthening and stretching exercises. Your health care provider may recommend specific exercises to improve your range of motion and strengthen your shoulder. In more severe cases, treatment may include:  Physical therapy.  Steroid injections.  Surgery. Follow these instructions at home: Managing pain, stiffness, and swelling  If directed, put ice on the injured area. To do this: ? If you have a removable sling, remove it as told by your health care provider. ? Put ice in a plastic bag. ? Place a towel between your skin and the bag. ? Leave the ice on for 20 minutes, 2-3 times a  day. ? Remove the ice if your skin turns bright red. This is very important. If you cannot feel pain, heat, or cold, you have a greater risk of damage to the area.  Raise (elevate) the injured area above the level of your heart while you are lying down.  Find a comfortable sleeping position or sleep on a recliner, if available.  Move your fingers often to reduce stiffness and swelling.  Once the  swelling has gone down, your health care provider may direct you to apply heat to relax the muscles. Use the heat source that your health care provider recommends, such as a moist heat pack or a heating pad. ? Place a towel between your skin and the heat source. ? Leave the heat on for 20-30 minutes. ? Remove the heat if your skin turns bright red. This is especially important if you are unable to feel pain, heat, or cold. You have a greater risk of getting burned.      If you have a sling:  Wear the sling as told by your health care provider. Remove it only as told by your health care provider.  Loosen the sling if your fingers tingle, become numb, or turn cold and blue.  Keep the sling clean.  If the sling is not waterproof: ? Do not let it get wet. ? Cover it with a watertight covering when you take a bath or a shower. Activity  Rest your shoulder as told by your health care provider.  Return to your normal activities as told by your health care provider. Ask your health care provider what activities are safe for you.  Ask your health care provider when it is safe to drive if you have a sling on your arm.  Do any exercises or stretches as told by your health care provider. General instructions  Take over-the-counter and prescription medicines only as told by your health care provider.  Do not use any products that contain nicotine or tobacco, such as cigarettes, e-cigarettes, and chewing tobacco. If you need help quitting, ask your health care provider.  Keep all follow-up visits. This is important. Contact a health care provider if:  Your pain gets worse.  You have new pain in your arm, hands, or fingers.  Medicine does not help your pain. Get help right away if:  Your arm, hand, or fingers are numb or tingling.  Your arm, hand, or fingers are swollen or painful or they turn white or blue.  Your hand or fingers on your injured arm are colder than your other  hand. Summary  A rotator cuff tear is a partial or complete tear of the cord-like bands (tendons) that connect muscle to bone in the rotator cuff.  The tear can occur suddenly (acute tear) or can develop over a long period of time (chronic tear).  Treatment generally includes rest, anti-inflammatory medicines, and icing. In some cases, physical therapy and steroid injections may be needed. In severe cases, surgery may be needed. This information is not intended to replace advice given to you by your health care provider. Make sure you discuss any questions you have with your health care provider. Document Revised: 03/14/2020 Document Reviewed: 03/14/2020 Elsevier Patient Education  2021 [employer].

## 2021-01-31 NOTE — Progress Notes (Signed)
New problem  Chief Complaint  Patient presents with  . Shoulder Pain    Right since May 2021 painful to move arm, fell off roof     History 65 year old male with intermittent shoulder pain over the years recently fell off a roof last May and since that time his had increasing pain in his right shoulder, difficulty reaching above his head or reaching away from his body with increasing pain including pain behind his shoulder blade and in the right deltoid  Review of systems history of tremor worked up with neurologist no identifiable cause history of dizziness tremor nervousness muscle aches joint pain frequent falls  He is diabetic he has had a stroke he has some sleep apnea and arthritis  He is taken some meloxicam 7.5 mg  Past Medical History:  Diagnosis Date  . 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 Essential hypertension 07/30/2012  . Fast heart beat   . Gastro-esophageal reflux disease with esophagitis 07/30/2012  . Generalized anxiety disorder 05/12/2017  . History of kidney stones   . Hyperlipidemia 07/30/2012  . Major depressive disorder   . OSA (obstructive sleep apnea)   . Type 2 diabetes mellitus 07/30/2012   Past Surgical History:  Procedure Laterality Date  . COLONOSCOPY    . COLONOSCOPY N/A 10/06/2018   Procedure: COLONOSCOPY;  Surgeon: Rogene Houston, MD;  Location: [facility];  Service: Endoscopy;  Laterality: N/A;  1:25  . FL INJ RT KNEE CT ARTHROGRAM ([facility] HX)    . POLYPECTOMY  10/06/2018   Procedure: POLYPECTOMY;  Surgeon: Rogene Houston, MD;  Location: [facility];  Service: Endoscopy;;  colon   . Rod in lower leg     trauma  . Rt hernia repair      BP (!) 154/84   Pulse 67   Ht 5\' 11"  (1.803 m)   Wt 230 lb (104.3 kg)   BMI 32.08 kg/m   General normal appearance  Neurovascular exam intact  Awake and alert  Normal mood blunted affect  Normal strength left shoulder  Right shoulder:  No skin abnormalities Tenderness  superior border of the scapula medially as well as anterior rotator interval and bicipital groove  He has definite weakness in abduction and flexion passive range of motion normal  No shoulder instability  Images show break in Shenton's line of the shoulder mild narrowing glenohumeral joint  Rotator cuff arthropathy  MRI right shoulder probable rotator cuff tear  Encounter Diagnoses  Name Primary?  . Chronic right shoulder pain   . Nontraumatic complete tear of right rotator cuff Yes  . Rotator cuff arthropathy of right shoulder    This is a chronic shoulder pain which is worsened, we will order an MRI

## 2021-02-04 NOTE — Progress Notes (Signed)
Virtual Visit via Telephone Note  I connected with Paul Webb on 02/11/21 at  9:00 AM EDT by telephone and verified that I am speaking with the correct person using two identifiers.  Location: Patient: home Provider: office   I discussed the limitations, risks, security and privacy concerns of performing an evaluation and management service by telephone and the availability of in person appointments. I also discussed with the patient that there may be a patient responsible charge related to this service. The patient expressed understanding and agreed to proceed.   I discussed the assessment and treatment plan with the patient. The patient was provided an opportunity to ask questions and all were answered. The patient agreed with the plan and demonstrated an understanding of the instructions.   The patient was advised to call back or seek an in-person evaluation if the symptoms worsen or if the condition fails to improve as anticipated.  I provided 13 minutes of non-face-to-face time during this encounter.   Norman Clay, MD     [facility] MD/PA/NP OP Progress Note  02/11/2021 9:19 AM Paul Webb  MRN:  [id number]  Chief Complaint:  Chief Complaint    Follow-up; Depression     HPI:  - he was evaluated by neuropsychologist for cognitive deficits. "Briefly, results suggested neuropsychological functioning largely within normal limits." - Head MRI on 10/2020: No acute intracranial process. Remote inferior right frontal insult. Minimal chronic microvascular ischemic changes.\  This is a follow-up appointment for depression.  He states that he has been doing very well.  He is getting out more, and attends church 3 times a week.  Although he is unable to do much of the outside activity due to his shoulder pain, he enjoys being outside.  He feels good about his 16 pounds weight loss since he is on healthy diet.  He denies any concerns at this time.  He denies feeling depressed.  There was a  few times he had anhedonia, although it improved soon.  He denies any SI.  He is willing to try discontinuing abilify.     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 MDD (major depressive disorder), recurrent, in full remission ([place])  F33.42     Past Psychiatric History: Please see initial evaluation for full details. I have reviewed the history. No updates at this time.     Past Medical History:  Past Medical History:  Diagnosis Date  . 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 Essential hypertension 07/30/2012  . Fast heart beat   . Gastro-esophageal reflux disease with esophagitis 07/30/2012  . Generalized anxiety disorder 05/12/2017  . History of kidney stones   . Hyperlipidemia 07/30/2012  . Major depressive disorder   . OSA (obstructive sleep apnea)   . Type 2 diabetes mellitus 07/30/2012    Past Surgical History:  Procedure Laterality Date  . COLONOSCOPY    . COLONOSCOPY N/A 10/06/2018   Procedure: COLONOSCOPY;  Surgeon: Rogene Houston, MD;  [facility]: [facility];  Service: Endoscopy;  Laterality: N/A;  1:25  . FL INJ RT KNEE CT ARTHROGRAM ([facility] HX)    . POLYPECTOMY  10/06/2018   Procedure: POLYPECTOMY;  Surgeon: Rogene Houston, MD;  Location: [facility];  Service: Endoscopy;;  colon   . Rod in lower leg     trauma  . Rt hernia repair      Family Psychiatric History: Please see initial evaluation for full details. I have reviewed the history. No updates at this time.     Family History:  Family History  Adopted: Yes  Family history unknown: Yes    Social History:  Social History   Socioeconomic History  . Marital status: Married    Spouse  name: Santiago Glad  . Number of children: Not on file  . Years of education: 82  . Highest education level: High school graduate  Occupational History  . Occupation: Retired  Tobacco Use  . Smoking status: Never Smoker  . Smokeless tobacco: Never Used  Vaping Use  . Vaping Use: Never used  Substance and Sexual Activity  . Alcohol use: Never  . Drug use: Never  . Sexual activity: Not on file  Other Topics Concern  . Not on file  Social History Narrative   Right Handed   Lives in one story with a basement    Drinks Caffeine    Social Determinants of Health   Financial Resource Strain: Not on file  Food Insecurity: Not on file  Transportation Needs: Not on file  Physical Activity: Not on file  Stress: Not on file  Social Connections: Not on file    Allergies: No Known Allergies  Metabolic Disorder Labs: No results found for: HGBA1C, MPG No results found for: PROLACTIN No results found for: CHOL, TRIG, HDL, CHOLHDL, VLDL, LDLCALC No results found for: TSH  Therapeutic Level Labs: No results found for: LITHIUM No results found for: VALPROATE No components found for:  CBMZ  Current Medications: Current Outpatient Medications  Medication Sig Dispense Refill  . aspirin EC 81 MG tablet Take 1 tablet (81 mg total) by mouth daily.    [name] atenolol (TENORMIN) 50 MG tablet Take 50 mg by mouth at bedtime.     [name] Bioflavonoid Products (BIOFLEX PO) Take by mouth.    Derrill Memo ON 02/15/2021] buPROPion (WELLBUTRIN XL) 300 MG 24 hr tablet Take 1 tablet (300 mg total) by mouth daily. 30 tablet 2  . cetirizine (ZYRTEC) 10 MG tablet Take 10 mg by mouth at bedtime.     . Cholecalciferol (VITAMIN D) 2000 units tablet Take 2,000 Units by mouth daily.    . fluticasone furoate-vilanterol (BREO ELLIPTA) 100-25 MCG/INH AEPB Inhale 1 puff into the lungs daily as needed (shortness of breath).    . hydrOXYzine (ATARAX/VISTARIL) 25 MG tablet Take 1 tablet (25 mg total) by mouth daily as needed for anxiety.  90 tablet 0  . lisinopril-hydrochlorothiazide (PRINZIDE,ZESTORETIC) 20-25 MG tablet Take 1 tablet by mouth daily.    . meloxicam (MOBIC) 7.5 MG tablet Take by mouth.    . Multiple Vitamin (MULTIVITAMIN) tablet Take 1 tablet by mouth daily.    [name] NOVOLOG 100 UNIT/ML injection     . Omega-3 Fatty Acids (FISH OIL) 1000 MG CAPS Take 1,000 mg by mouth daily.    [name] omeprazole (PRILOSEC) 40 MG capsule Take 40 mg by mouth daily.    . pravastatin (PRAVACHOL) 80 MG tablet Take 80 mg by mouth at bedtime.     . sertraline (ZOLOFT) 100 MG tablet Take 1.5 tablets (150 mg total) by mouth daily. 45 tablet 0  . [START ON 02/15/2021] sertraline (ZOLOFT) 100 MG tablet Take 1.5 tablets (150 mg total) by mouth daily. Patrick  tablet 2  . SYMLINPEN 60 1500 MCG/1.5ML SOPN     . [START ON 02/15/2021] traZODone (DESYREL) 100 MG tablet Take 1 tablet (100 mg total) by mouth at bedtime as needed for sleep. 30 tablet 2   No current facility-administered medications for this visit.     Musculoskeletal: Strength & Muscle Tone: N/A Gait & Station: N/A Patient leans: N/A  Psychiatric Specialty Exam: Review of Systems  Psychiatric/Behavioral: Negative for agitation, behavioral problems, confusion, decreased concentration, dysphoric mood, hallucinations, self-injury, sleep disturbance and suicidal ideas. The patient is not nervous/anxious and is not hyperactive.   All other systems reviewed and are negative.   There were no vitals taken for this visit.There is no height or weight on file to calculate BMI.  General Appearance: NA  Eye Contact:  NA  Speech:  Clear and Coherent  Volume:  Normal  Mood:  good  Affect:  NA  Thought Process:  Coherent  Orientation:  Full (Time, Place, and Person)  Thought Content: Logical   Suicidal Thoughts:  No  Homicidal Thoughts:  No  Memory:  Immediate;   Good  Judgement:  Good  Insight:  Good  Psychomotor Activity:  Normal  Concentration:  Concentration: Good and Attention Span: Good   Recall:  Good  Fund of Knowledge: Good  Language: Good  Akathisia:  No  Handed:  Right  AIMS (if indicated): not done  Assets:  Communication Skills Desire for Improvement  ADL's:  Intact  Cognition: WNL  Sleep:  fair   Screenings: PHQ2-9   Flowsheet Row Video Visit from 02/11/2021 in [facility]  PHQ-2 Total Score 1    Flowsheet Row Video Visit from 02/11/2021 in [facility] CATEGORY No Risk       Assessment and Plan:  Paul Webb is a 65 y.o. year old male with a history of depression, anxiety,diabetes, hyperlipidemia, sleep apnea, who presents for follow up appointment for below.   1. MDD (major depressive disorder), recurrent, in full remission ([place]) He denies any depressive symptoms except occasional brief episodes of anhedonia since the last visit. Psychosocial stressors includes demoralization due to diabetes and employment.    Will discontinue Abilify to avoid long-term side effect.  Both the patient and his wife agrees to contact office if any worsening in his symptoms.  Will continue sertraline and bupropion to target depression.  He has no known history of seizure.   # Sleep apnea He was found to have deviated septum, and he will have procedure for this.  We will continue trazodone as needed for insomnia.  Noted that he has not been able to use CPAP machine due to anxiety.   Plan I have reviewed and updated plans as below 1.Continue sertraline 150 mg daily 2. Continuebupropion300 mg daily(limited benefit from uptitration of 150 to 300 mg ) 3.Discontinue Abilify (used to take 2 mg) 4. Continue Trazodone 100 mg at night  5. Continue hydroxyzine 25 mg daily as needed for anxiety- he declined refill 6.Next appointment: 6/21 at 8:74for 20 mins, phone -TSH checked according to his wife a few weeks ago; wnl  Past trials of medication:?lexapro, venlafaxine,  The patient  demonstrates the following risk factors for suicide: Chronic risk factors for suicide include:psychiatric disorder ofdepression. Acute risk factorsfor suicide include: family or marital conflict and loss (financial, interpersonal, professional). Protective factorsfor this patient include: coping skills and hope for the future. Considering these factors, the overall suicide risk at this point appears  to below. Patientisappropriate for outpatient follow up.      Norman Clay, MD 02/11/2021, 9:19 AM

## 2021-02-11 ENCOUNTER — Other Ambulatory Visit: Payer: Self-pay

## 2021-02-11 ENCOUNTER — Encounter: Payer: Self-pay | Admitting: Psychiatry

## 2021-02-11 ENCOUNTER — Telehealth (INDEPENDENT_AMBULATORY_CARE_PROVIDER_SITE_OTHER): Payer: Medicare PPO | Admitting: Psychiatry

## 2021-02-11 DIAGNOSIS — F3342 Major depressive disorder, recurrent, in full remission: Secondary | ICD-10-CM

## 2021-02-11 MED ORDER — TRAZODONE HCL 100 MG PO TABS
100.0000 mg | ORAL_TABLET | Freq: Every evening | ORAL | 2 refills | Status: DC | PRN
Start: 2021-02-15 — End: 2021-05-14

## 2021-02-11 MED ORDER — SERTRALINE HCL 100 MG PO TABS
150.0000 mg | ORAL_TABLET | Freq: Every day | ORAL | 2 refills | Status: DC
Start: 2021-02-15 — End: 2021-02-20

## 2021-02-11 MED ORDER — BUPROPION HCL ER (XL) 300 MG PO TB24: 300.0000 mg | ORAL_TABLET | Freq: Every day | ORAL | 2 refills | Status: DC

## 2021-02-11 NOTE — Patient Instructions (Signed)
1.Continue sertraline 150 mg daily 2. Continuebupropion300 mg daily 3.Discontinue Abilify  4. Continue Trazodone 100 mg at night  5. Continue hydroxyzine 25 mg daily as needed for anxiety 6.Next appointment: 6/21 at 8:40

## 2021-02-12 DIAGNOSIS — E1065 Type 1 diabetes mellitus with hyperglycemia: Secondary | ICD-10-CM | POA: Diagnosis not present

## 2021-02-13 ENCOUNTER — Ambulatory Visit: Payer: Medicare PPO | Admitting: Orthopedic Surgery

## 2021-02-14 ENCOUNTER — Ambulatory Visit (HOSPITAL_COMMUNITY)
Admission: RE | Admit: 2021-02-14 | Discharge: 2021-02-14 | Disposition: A | Discharge status: Home / Self Care | Payer: Medicare PPO | Source: Ambulatory Visit | Attending: Orthopedic Surgery | Admitting: Orthopedic Surgery

## 2021-02-14 DIAGNOSIS — M25511 Pain in right shoulder: Secondary | ICD-10-CM | POA: Diagnosis not present

## 2021-02-14 DIAGNOSIS — G8929 Other chronic pain: Secondary | ICD-10-CM | POA: Diagnosis not present

## 2021-02-14 IMAGING — MR MR SHOULDER*R* W/O CM
5 series · 40 of 40 positions shown · non-contrast
Comparison: X-ray [DATE]

CLINICAL DATA: Right shoulder pain for 6 months after fall

EXAM:
MRI OF THE RIGHT SHOULDER WITHOUT CONTRAST
TECHNIQUE: Multiplanar, multisequence MR imaging of the shoulder was performed.
No intravenous contrast was administered.

[Series 5: T2 fat-sat · axial · right · 4.0mm · 0.47mm/px · z∈[-103,+15]mm · 8 of 26 slices shown (1 of 3)]
[im 1/26]
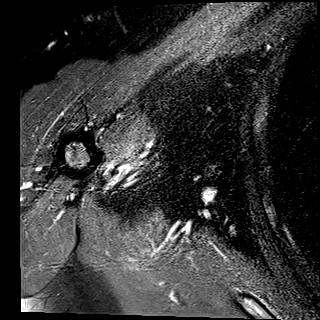
[im 4/26]
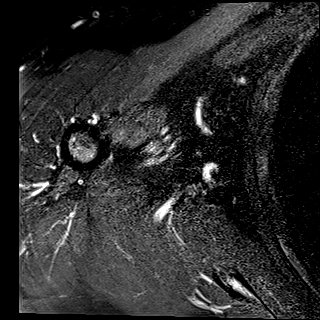
[im 8/26]
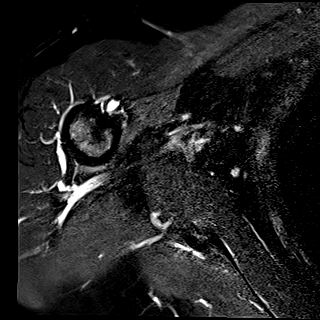
[im 11/26]
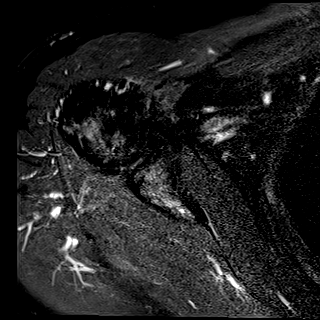
[im 15/26]
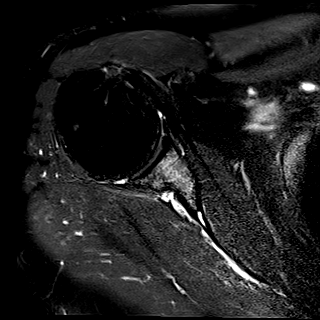
[im 18/26]
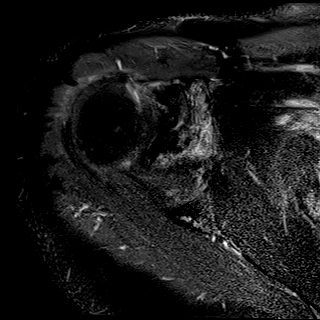
[im 22/26]
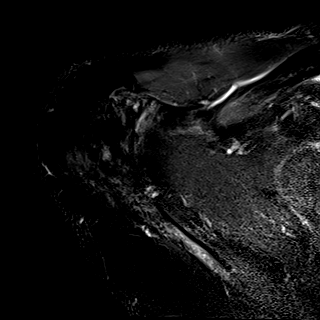
[im 26/26]
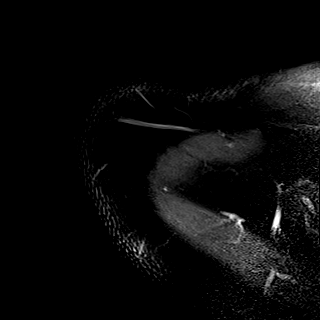

[Series 6: T2 fat-sat · oblique · right · 4.0mm · 0.47mm/px · 8 of 26 slices shown (2 of 3)]
[im 1/26]
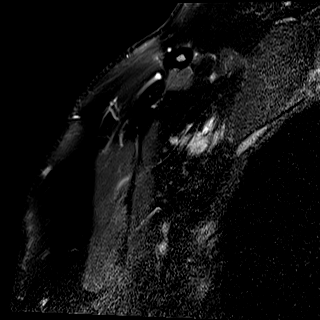
[im 4/26]
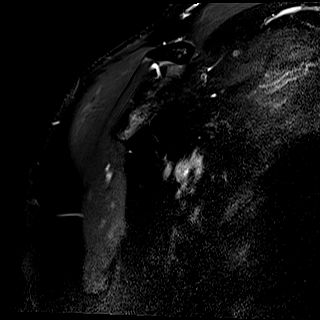
[im 8/26]
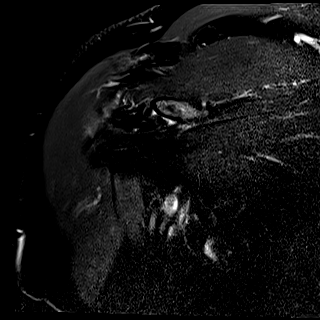
[im 11/26]
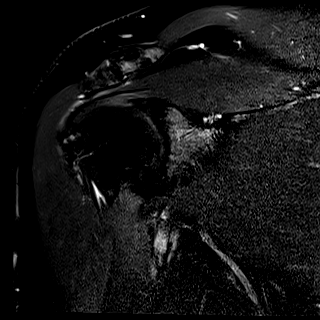
[im 15/26]
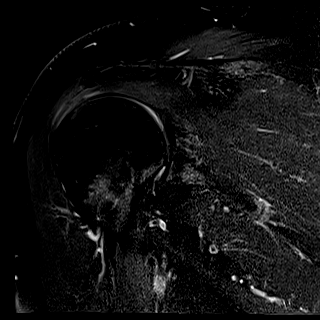
[im 18/26]
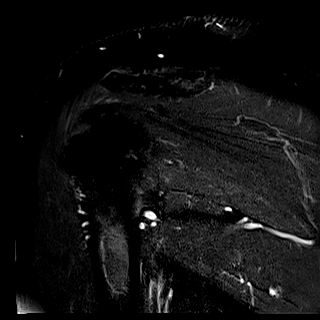
[im 22/26]
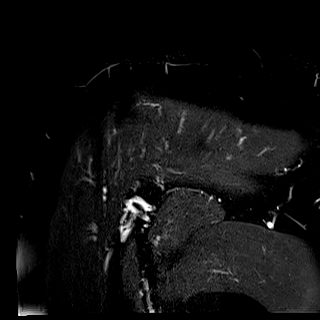
[im 26/26]
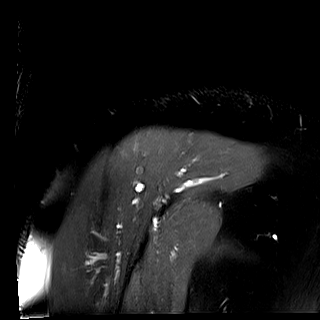

[Series 7: PD · oblique · right · 4.0mm · 0.47mm/px · 8 of 26 slices shown]
[im 1/26]
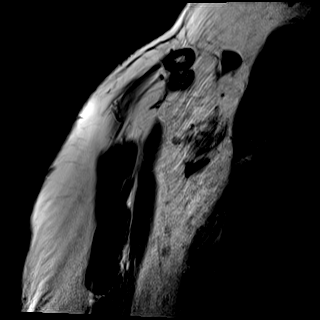
[im 4/26]
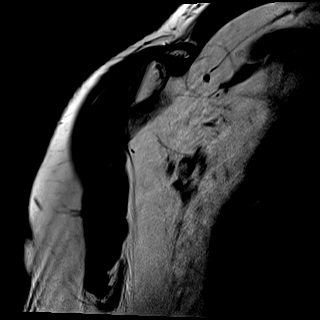
[im 8/26]
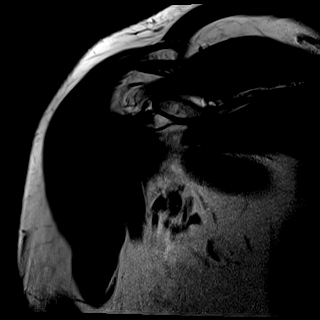
[im 11/26]
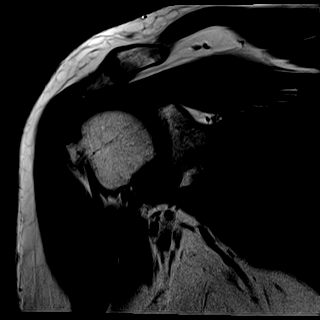
[im 15/26]
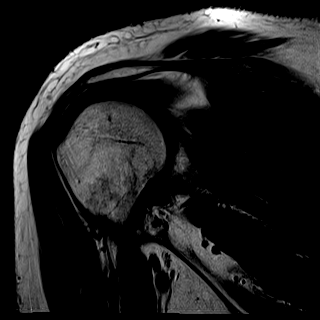
[im 18/26]
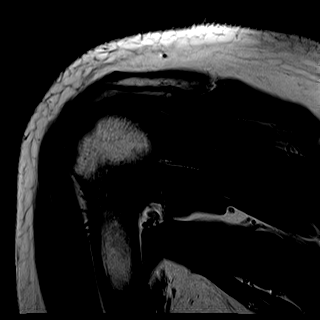
[im 22/26]
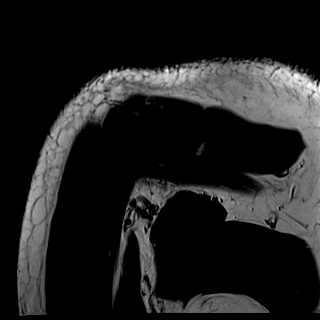
[im 26/26]
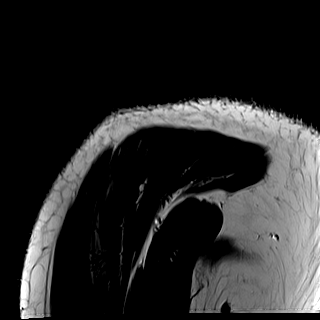

[Series 8: T2 fat-sat · oblique · right · 4.0mm · 0.44mm/px · 8 of 25 slices shown (3 of 3)]
[im 1/25]
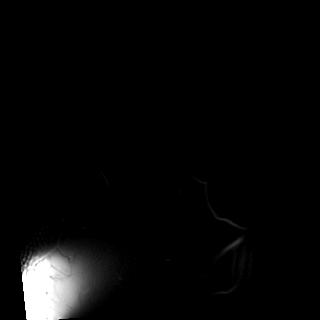
[im 4/25]
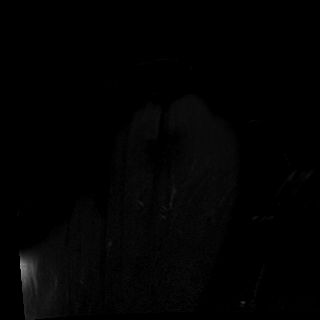
[im 7/25]
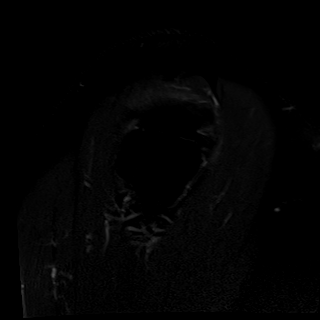
[im 11/25]
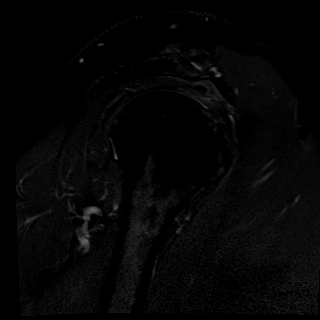
[im 14/25]
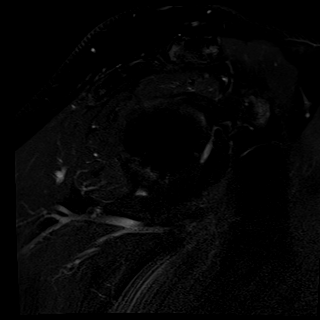
[im 18/25]
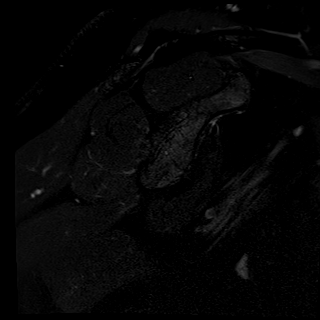
[im 21/25]
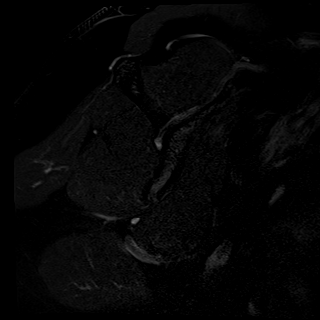
[im 25/25]
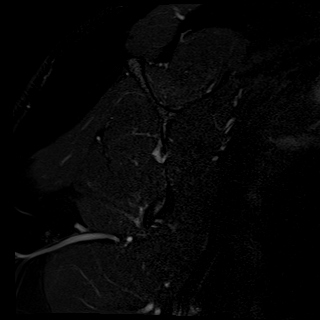

[Series 9: T1 · oblique · right · 4.0mm · 0.41mm/px · 8 of 25 slices shown]
[im 1/25]
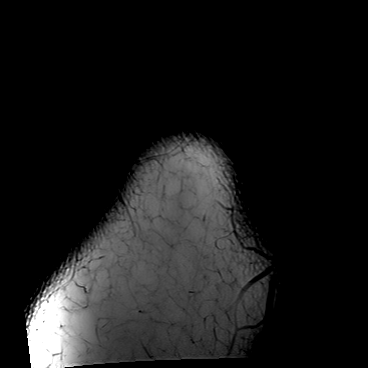
[im 4/25]
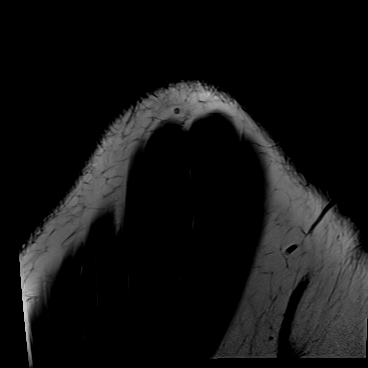
[im 7/25]
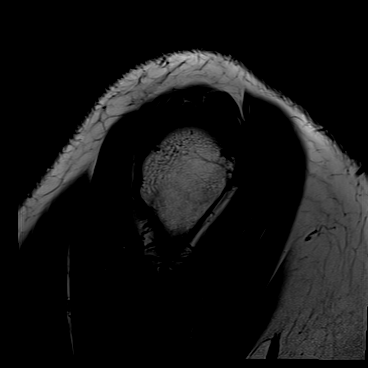
[im 11/25]
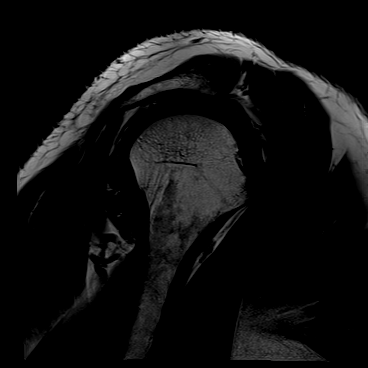
[im 14/25]
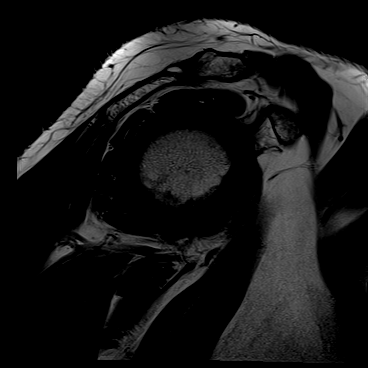
[im 18/25]
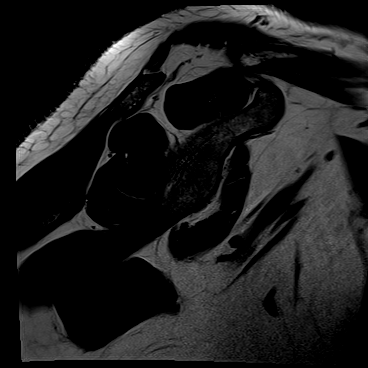
[im 21/25]
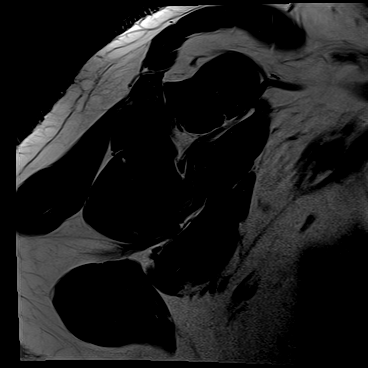
[im 25/25]
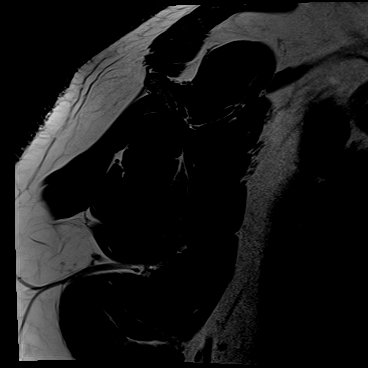

[40 of 40 positions shown; findings below may reference images not displayed]

FINDINGS: Rotator cuff: Mild supraspinatus greater than infraspinatus
tendinosis. Low-grade bursal sided fraying of the anterior aspect of
the distal supraspinatus tendon without discernible tear.
Subscapularis and teres minor tendons intact. No full thickness
rotator cuff tear.

Muscles: Preserved bulk and signal intensity of the rotator cuff
musculature without edema, atrophy, or fatty infiltration.

Biceps long head: Mild intra-articular biceps tendinosis with
probable longitudinal split tear near the tendon anchor site (series
8, image 13).

Acromioclavicular Joint: Mild arthropathy of the AC joint. No
subacromial-subdeltoid bursal fluid.

Glenohumeral Joint: Mild chondral thinning without focal defect. No
joint effusion.

Labrum: Suspect posterosuperior labral tear (series 6, images
14-15). Posterior labrum appears diminutive. Labral evaluation is
limited in the absence of intra-articular fluid or contrast. No
paralabral cyst.

Bones: No acute fracture or dislocation. No marrow replacing bone
lesion.

Other: None.
IMPRESSION: 1. Mild supraspinatus greater than infraspinatus tendinosis.
Low-grade bursal sided fraying of the anterior aspect of the distal
supraspinatus tendon without discernible tear.
2. Suspect posterosuperior labral tear.
3. Mild intra-articular biceps tendinosis with probable longitudinal
split tear near the tendon anchor site.

## 2021-02-18 ENCOUNTER — Other Ambulatory Visit: Payer: Self-pay

## 2021-02-18 ENCOUNTER — Ambulatory Visit: Payer: Medicare PPO | Admitting: Orthopedic Surgery

## 2021-02-18 ENCOUNTER — Encounter: Payer: Self-pay | Admitting: Orthopedic Surgery

## 2021-02-18 VITALS — BP 150/76 | HR 63 | Ht 71.0 in | Wt 220.0 lb

## 2021-02-18 DIAGNOSIS — G8929 Other chronic pain: Secondary | ICD-10-CM

## 2021-02-18 DIAGNOSIS — M25511 Pain in right shoulder: Secondary | ICD-10-CM | POA: Diagnosis not present

## 2021-02-18 NOTE — Progress Notes (Signed)
MRI RESULTS FOLLOW UP   No diagnosis found.  Chief Complaint  Patient presents with  . Shoulder Pain    R/still having pain. Here to go over MRI.    History 65 year old male with intermittent shoulder pain over the years recently fell off a roof last May and since that time his had increasing pain in his right shoulder, difficulty reaching above his head or reaching away from his body with increasing pain including pain behind his shoulder blade and in the right deltoid   Review of systems history of tremor worked up with neurologist no identifiable cause history of dizziness tremor nervousness muscle aches joint pain frequent falls   He is diabetic he has had a stroke he has some sleep apnea and arthritis   He has taken some meloxicam 7.5 mg     MY READING OF THE MRI no obvious rotator cuff tear there does appear to be abnormality in posterior superior labrum and then the intra-articular portion of the biceps tendon is split longitudinally  MRI REPORT:    IMPRESSION: 1. Mild supraspinatus greater than infraspinatus tendinosis. Low-grade bursal sided fraying of the anterior aspect of the distal supraspinatus tendon without discernible tear. 2. Suspect posterosuperior labral tear. 3. Mild intra-articular biceps tendinosis with probable longitudinal split tear near the tendon anchor site.   Electronically Signed   By: Davina Poke D.O.   On: 02/15/2021 08:47  ASSESSMENT AND PLAN :   Paul Webb would like to proceed with arthroscopic surgery on the right shoulder to debride the labral tear and to perform a biceps tenodesis.  We discussed the possibility of doing physical therapy which he declined and would prefer to proceed with the surgery  Probable 12-week recovery  He also wants to be seen for Dupuytren's contracture in his left hand  And I referred him to Dr. Bethann Goo to go over some therapeutic treatments regarding his lower back pain which she says does not cause  him any leg pain numbness or tingling

## 2021-02-18 NOTE — Patient Instructions (Signed)
Proceed with nasal surgery then call back for scheduling of arthroscopy right shoulder biceps tenodesis and labral debridement

## 2021-02-20 ENCOUNTER — Encounter (HOSPITAL_BASED_OUTPATIENT_CLINIC_OR_DEPARTMENT_OTHER): Payer: Self-pay | Admitting: Otolaryngology

## 2021-02-20 ENCOUNTER — Other Ambulatory Visit: Payer: Self-pay

## 2021-02-25 ENCOUNTER — Other Ambulatory Visit (HOSPITAL_COMMUNITY)
Admission: RE | Admit: 2021-02-25 | Discharge: 2021-02-25 | Disposition: A | Discharge status: Home / Self Care | Payer: Medicare PPO | Source: Ambulatory Visit | Attending: Otolaryngology | Admitting: Otolaryngology

## 2021-02-25 ENCOUNTER — Encounter (HOSPITAL_BASED_OUTPATIENT_CLINIC_OR_DEPARTMENT_OTHER)
Admission: RE | Admit: 2021-02-25 | Discharge: 2021-02-25 | Disposition: A | Discharge status: Home / Self Care | Payer: Medicare PPO | Source: Ambulatory Visit | Attending: Otolaryngology | Admitting: Otolaryngology

## 2021-02-25 DIAGNOSIS — Z20822 Contact with and (suspected) exposure to covid-19: Secondary | ICD-10-CM | POA: Diagnosis not present

## 2021-02-25 DIAGNOSIS — Z01818 Encounter for other preprocedural examination: Secondary | ICD-10-CM | POA: Insufficient documentation

## 2021-02-25 DIAGNOSIS — E119 Type 2 diabetes mellitus without complications: Secondary | ICD-10-CM | POA: Insufficient documentation

## 2021-02-25 DIAGNOSIS — Z01812 Encounter for preprocedural laboratory examination: Secondary | ICD-10-CM | POA: Diagnosis not present

## 2021-02-25 DIAGNOSIS — I10 Essential (primary) hypertension: Secondary | ICD-10-CM | POA: Insufficient documentation

## 2021-02-25 LAB — BASIC METABOLIC PANEL
Anion gap: 7 (ref 5–15)
BUN: 26 mg/dL — ABNORMAL HIGH (ref 8–23)
CO2: 27 mmol/L (ref 22–32)
Calcium: 9.2 mg/dL (ref 8.9–10.3)
Chloride: 105 mmol/L (ref 98–111)
Creatinine, Ser: 0.95 mg/dL (ref 0.61–1.24)
GFR, Estimated: >60 mL/min (ref >60)
Glucose, Bld: 190 mg/dL — ABNORMAL HIGH (ref 70–99)
Potassium: 4.5 mmol/L (ref 3.5–5.1)
Sodium: 139 mmol/L (ref 135–145)

## 2021-02-26 LAB — SARS CORONAVIRUS 2 (TAT 6-24 HRS): SARS Coronavirus 2: NEGATIVE

## 2021-02-26 NOTE — Anesthesia Preprocedure Evaluation (Addendum)
Anesthesia Evaluation  Patient identified by MRN, date of birth, ID band Patient awake    Reviewed: Allergy & Precautions, NPO status , Patient's Chart, lab work & pertinent test results, reviewed documented beta blocker date and time   Airway Mallampati: III  TM Distance: <3 FB Neck ROM: Full    Dental  (+) Teeth Intact, Dental Advisory Given   Pulmonary sleep apnea ,    Pulmonary exam normal breath sounds clear to auscultation       Cardiovascular hypertension, Pt. on home beta blockers and Pt. on medications (-) angina(-) Past MI Normal cardiovascular exam Rhythm:Regular Rate:Normal     Neuro/Psych PSYCHIATRIC DISORDERS Anxiety Depression negative neurological ROS     GI/Hepatic Neg liver ROS, GERD  ,  Endo/Other  diabetes, Type 2, Insulin Dependent  Renal/GU negative Renal ROS     Musculoskeletal  (+) Arthritis ,   Abdominal   Peds  Hematology negative hematology ROS (+)   Anesthesia Other Findings   Reproductive/Obstetrics                            Anesthesia Physical Anesthesia Plan  ASA: II  Anesthesia Plan: General   Post-op Pain Management:    Induction: Intravenous  PONV Risk Score and Plan: 2 and Midazolam, Propofol infusion, Dexamethasone and Ondansetron  Airway Management Planned: Oral ETT  Additional Equipment:   Intra-op Plan:   Post-operative Plan: Extubation in OR  Informed Consent: I have reviewed the patients History and Physical, chart, labs and discussed the procedure including the risks, benefits and alternatives for the proposed anesthesia with the patient or authorized representative who has indicated his/her understanding and acceptance.     Dental advisory given  Plan Discussed with: CRNA  Anesthesia Plan Comments: (MAC for DISE, then GETA for septoplasty)       Anesthesia Quick Evaluation

## 2021-02-27 ENCOUNTER — Encounter (HOSPITAL_BASED_OUTPATIENT_CLINIC_OR_DEPARTMENT_OTHER): Payer: Self-pay | Admitting: Otolaryngology

## 2021-02-27 ENCOUNTER — Encounter (HOSPITAL_BASED_OUTPATIENT_CLINIC_OR_DEPARTMENT_OTHER)
Admission: RE | Disposition: A | Discharge status: Home / Self Care | Payer: Self-pay | Source: Home / Self Care | Attending: Otolaryngology

## 2021-02-27 ENCOUNTER — Ambulatory Visit (HOSPITAL_BASED_OUTPATIENT_CLINIC_OR_DEPARTMENT_OTHER)
Admission: RE | Admit: 2021-02-27 | Discharge: 2021-02-27 | Disposition: A | Discharge status: Home / Self Care | Payer: Medicare PPO | Attending: Otolaryngology | Admitting: Otolaryngology

## 2021-02-27 ENCOUNTER — Ambulatory Visit (HOSPITAL_BASED_OUTPATIENT_CLINIC_OR_DEPARTMENT_OTHER): Payer: Medicare PPO | Admitting: Anesthesiology

## 2021-02-27 ENCOUNTER — Other Ambulatory Visit: Payer: Self-pay

## 2021-02-27 DIAGNOSIS — Z79899 Other long term (current) drug therapy: Secondary | ICD-10-CM | POA: Diagnosis not present

## 2021-02-27 DIAGNOSIS — G4733 Obstructive sleep apnea (adult) (pediatric): Secondary | ICD-10-CM | POA: Insufficient documentation

## 2021-02-27 DIAGNOSIS — J342 Deviated nasal septum: Secondary | ICD-10-CM | POA: Insufficient documentation

## 2021-02-27 DIAGNOSIS — Z791 Long term (current) use of non-steroidal anti-inflammatories (NSAID): Secondary | ICD-10-CM | POA: Insufficient documentation

## 2021-02-27 DIAGNOSIS — J3489 Other specified disorders of nose and nasal sinuses: Secondary | ICD-10-CM | POA: Diagnosis not present

## 2021-02-27 DIAGNOSIS — J343 Hypertrophy of nasal turbinates: Secondary | ICD-10-CM | POA: Insufficient documentation

## 2021-02-27 DIAGNOSIS — Z794 Long term (current) use of insulin: Secondary | ICD-10-CM | POA: Diagnosis not present

## 2021-02-27 DIAGNOSIS — Z87442 Personal history of urinary calculi: Secondary | ICD-10-CM | POA: Insufficient documentation

## 2021-02-27 DIAGNOSIS — Z7982 Long term (current) use of aspirin: Secondary | ICD-10-CM | POA: Insufficient documentation

## 2021-02-27 HISTORY — PX: DRUG INDUCED ENDOSCOPY: SHX6808

## 2021-02-27 HISTORY — DX: Unspecified osteoarthritis, unspecified site: M19.90

## 2021-02-27 HISTORY — PX: NASAL SEPTOPLASTY W/ TURBINOPLASTY: SHX2070

## 2021-02-27 LAB — GLUCOSE, CAPILLARY
Glucose-Capillary: 163 mg/dL — ABNORMAL HIGH (ref 70–99)
Glucose-Capillary: 165 mg/dL — ABNORMAL HIGH (ref 70–99)

## 2021-02-27 SURGERY — SEPTOPLASTY, NOSE, WITH NASAL TURBINATE REDUCTION
Anesthesia: General | Site: Nose

## 2021-02-27 MED ORDER — LIDOCAINE HCL (CARDIAC) PF 100 MG/5ML IV SOSY
PREFILLED_SYRINGE | INTRAVENOUS | Status: DC | PRN
  Administered 2021-02-27: 100 mg via INTRAVENOUS

## 2021-02-27 MED ORDER — MUPIROCIN 2 % EX OINT
TOPICAL_OINTMENT | CUTANEOUS | Status: AC
  Filled 2021-02-27: qty 22

## 2021-02-27 MED ORDER — ALBUTEROL SULFATE (2.5 MG/3ML) 0.083% IN NEBU
2.5000 mg | INHALATION_SOLUTION | Freq: Four times a day (QID) | RESPIRATORY_TRACT | Status: DC | PRN
  Administered 2021-02-27: 2.5 mg via RESPIRATORY_TRACT

## 2021-02-27 MED ORDER — FENTANYL CITRATE (PF) 100 MCG/2ML IJ SOLN
INTRAMUSCULAR | Status: DC | PRN
  Administered 2021-02-27: 100 ug via INTRAVENOUS

## 2021-02-27 MED ORDER — DEXAMETHASONE SODIUM PHOSPHATE 10 MG/ML IJ SOLN
INTRAMUSCULAR | Status: AC
  Filled 2021-02-27: qty 1

## 2021-02-27 MED ORDER — ALBUTEROL SULFATE (2.5 MG/3ML) 0.083% IN NEBU
INHALATION_SOLUTION | RESPIRATORY_TRACT | Status: AC
  Filled 2021-02-27: qty 3

## 2021-02-27 MED ORDER — FENTANYL CITRATE (PF) 100 MCG/2ML IJ SOLN
INTRAMUSCULAR | Status: AC
  Filled 2021-02-27: qty 2

## 2021-02-27 MED ORDER — ROCURONIUM BROMIDE 10 MG/ML (PF) SYRINGE
PREFILLED_SYRINGE | INTRAVENOUS | Status: AC
  Filled 2021-02-27: qty 10

## 2021-02-27 MED ORDER — CEFAZOLIN SODIUM-DEXTROSE 2-4 GM/100ML-% IV SOLN
2.0000 g | INTRAVENOUS | Status: AC
  Administered 2021-02-27: 2 g via INTRAVENOUS

## 2021-02-27 MED ORDER — ACETAMINOPHEN 500 MG PO TABS
ORAL_TABLET | ORAL | Status: AC
  Filled 2021-02-27: qty 2

## 2021-02-27 MED ORDER — ONDANSETRON HCL 4 MG/2ML IJ SOLN
INTRAMUSCULAR | Status: DC | PRN
  Administered 2021-02-27: 4 mg via INTRAVENOUS

## 2021-02-27 MED ORDER — PROMETHAZINE HCL 25 MG/ML IJ SOLN: 6.2500 mg | INTRAMUSCULAR | Status: DC | PRN

## 2021-02-27 MED ORDER — ACETAMINOPHEN 500 MG PO TABS
1000.0000 mg | ORAL_TABLET | Freq: Once | ORAL | Status: AC
  Administered 2021-02-27: 1000 mg via ORAL

## 2021-02-27 MED ORDER — ALBUTEROL SULFATE HFA 108 (90 BASE) MCG/ACT IN AERS
INHALATION_SPRAY | RESPIRATORY_TRACT | Status: DC | PRN
  Administered 2021-02-27: 5 via RESPIRATORY_TRACT

## 2021-02-27 MED ORDER — MIDAZOLAM HCL 2 MG/2ML IJ SOLN
INTRAMUSCULAR | Status: AC
  Filled 2021-02-27: qty 2

## 2021-02-27 MED ORDER — LIDOCAINE 2% (20 MG/ML) 5 ML SYRINGE
INTRAMUSCULAR | Status: AC
  Filled 2021-02-27: qty 5

## 2021-02-27 MED ORDER — CEPHALEXIN 500 MG PO CAPS: 500.0000 mg | ORAL_CAPSULE | Freq: Three times a day (TID) | ORAL | 0 refills | Status: AC

## 2021-02-27 MED ORDER — OXYMETAZOLINE HCL 0.05 % NA SOLN
NASAL | Status: AC
  Filled 2021-02-27: qty 30

## 2021-02-27 MED ORDER — SUGAMMADEX SODIUM 500 MG/5ML IV SOLN
INTRAVENOUS | Status: AC
  Filled 2021-02-27: qty 5

## 2021-02-27 MED ORDER — LIDOCAINE-EPINEPHRINE 1 %-1:100000 IJ SOLN
INTRAMUSCULAR | Status: AC
  Filled 2021-02-27: qty 1

## 2021-02-27 MED ORDER — CEFAZOLIN SODIUM-DEXTROSE 2-4 GM/100ML-% IV SOLN
INTRAVENOUS | Status: AC
  Filled 2021-02-27: qty 100

## 2021-02-27 MED ORDER — PROPOFOL 10 MG/ML IV BOLUS
INTRAVENOUS | Status: DC | PRN
  Administered 2021-02-27 (×4): 10 mg via INTRAVENOUS
  Administered 2021-02-27: 100 mg via INTRAVENOUS
  Administered 2021-02-27: 10 mg via INTRAVENOUS
  Administered 2021-02-27: 25 ug/kg/min via INTRAVENOUS
  Administered 2021-02-27: 10 mg via INTRAVENOUS
  Administered 2021-02-27: 20 mg via INTRAVENOUS

## 2021-02-27 MED ORDER — SUGAMMADEX SODIUM 500 MG/5ML IV SOLN
INTRAVENOUS | Status: DC | PRN
  Administered 2021-02-27: 400 mg via INTRAVENOUS

## 2021-02-27 MED ORDER — ONDANSETRON HCL 4 MG/2ML IJ SOLN
INTRAMUSCULAR | Status: AC
  Filled 2021-02-27: qty 2

## 2021-02-27 MED ORDER — LACTATED RINGERS IV SOLN: INTRAVENOUS | Status: DC

## 2021-02-27 MED ORDER — PROPOFOL 500 MG/50ML IV EMUL
INTRAVENOUS | Status: AC
  Filled 2021-02-27: qty 50

## 2021-02-27 MED ORDER — FENTANYL CITRATE (PF) 100 MCG/2ML IJ SOLN: 25.0000 ug | INTRAMUSCULAR | Status: DC | PRN

## 2021-02-27 MED ORDER — DEXAMETHASONE SODIUM PHOSPHATE 4 MG/ML IJ SOLN
INTRAMUSCULAR | Status: DC | PRN
  Administered 2021-02-27: 4 mg via INTRAVENOUS

## 2021-02-27 MED ORDER — OXYMETAZOLINE HCL 0.05 % NA SOLN
NASAL | Status: DC | PRN
  Administered 2021-02-27: 1

## 2021-02-27 MED ORDER — PROPOFOL 10 MG/ML IV BOLUS
INTRAVENOUS | Status: AC
  Filled 2021-02-27: qty 40

## 2021-02-27 MED ORDER — PROPOFOL 10 MG/ML IV BOLUS
INTRAVENOUS | Status: AC
  Filled 2021-02-27: qty 20

## 2021-02-27 MED ORDER — LIDOCAINE-EPINEPHRINE 1 %-1:100000 IJ SOLN
INTRAMUSCULAR | Status: DC | PRN
  Administered 2021-02-27: 5 mL

## 2021-02-27 MED ORDER — BACITRACIN ZINC 500 UNIT/GM EX OINT
TOPICAL_OINTMENT | CUTANEOUS | Status: AC
  Filled 2021-02-27: qty 28.35

## 2021-02-27 SURGICAL SUPPLY — 39 items
ATTRACTOMAT 16X20 MAGNETIC DRP (DRAPES) IMPLANT
BLADE SURG 15 STRL LF DISP TIS (BLADE) IMPLANT
BLADE SURG 15 STRL SS (BLADE)
CANISTER SUCT 1200ML W/VALVE (MISCELLANEOUS) ×4 IMPLANT
COAGULATOR SUCT 8FR VV (MISCELLANEOUS) IMPLANT
COVER WAND RF STERILE (DRAPES) IMPLANT
DECANTER SPIKE VIAL GLASS SM (MISCELLANEOUS) IMPLANT
DRSG NASOPORE 8CM (GAUZE/BANDAGES/DRESSINGS) IMPLANT
DRSG TELFA 3X8 NADH (GAUZE/BANDAGES/DRESSINGS) IMPLANT
ELECT REM PT RETURN 9FT ADLT (ELECTROSURGICAL)
ELECTRODE REM PT RTRN 9FT ADLT (ELECTROSURGICAL) IMPLANT
GLOVE SURG ENC TEXT LTX SZ7 (GLOVE) ×8 IMPLANT
GOWN STRL REUS W/ TWL LRG LVL3 (GOWN DISPOSABLE) ×4 IMPLANT
GOWN STRL REUS W/TWL LRG LVL3 (GOWN DISPOSABLE) ×8
IV SET EXT 30 76VOL 4 MALE LL (IV SETS) ×4 IMPLANT
KIT CLEAN ENDO (MISCELLANEOUS) ×4 IMPLANT
NEEDLE PRECISIONGLIDE 27X1.5 (NEEDLE) ×4 IMPLANT
NS IRRIG 1000ML POUR BTL (IV SOLUTION) IMPLANT
PACK BASIN DAY SURGERY FS (CUSTOM PROCEDURE TRAY) ×4 IMPLANT
PACK ENT DAY SURGERY (CUSTOM PROCEDURE TRAY) ×4 IMPLANT
PATTIES SURGICAL .5 X3 (DISPOSABLE) IMPLANT
SHEET MEDIUM DRAPE 40X70 STRL (DRAPES) ×4 IMPLANT
SLEEVE SCD COMPRESS KNEE MED (STOCKING) IMPLANT
SOL ANTI FOG 6CC (MISCELLANEOUS) ×2 IMPLANT
SOLUTION ANTI FOG 6CC (MISCELLANEOUS) ×2
SPLINT NASAL AIRWAY SILICONE (MISCELLANEOUS) ×4 IMPLANT
SPONGE GAUZE 2X2 8PLY STER LF (GAUZE/BANDAGES/DRESSINGS) ×1
SPONGE GAUZE 2X2 8PLY STRL LF (GAUZE/BANDAGES/DRESSINGS) ×3 IMPLANT
SPONGE NEURO XRAY DETECT 1X3 (DISPOSABLE) ×4 IMPLANT
SPONGE SURGIFOAM ABS GEL 12-7 (HEMOSTASIS) IMPLANT
SUT ETHILON 3 0 PS 1 (SUTURE) ×4 IMPLANT
SUT PLAIN 4 0 ~~LOC~~ 1 (SUTURE) ×4 IMPLANT
SYR CONTROL 10ML LL (SYRINGE) IMPLANT
TOWEL GREEN STERILE FF (TOWEL DISPOSABLE) ×4 IMPLANT
TUBE CONNECTING 20'X1/4 (TUBING)
TUBE CONNECTING 20X1/4 (TUBING) IMPLANT
TUBE SALEM SUMP 12R W/ARV (TUBING) IMPLANT
TUBE SALEM SUMP 16 FR W/ARV (TUBING) IMPLANT
YANKAUER SUCT BULB TIP NO VENT (SUCTIONS) ×4 IMPLANT

## 2021-02-27 NOTE — Anesthesia Postprocedure Evaluation (Signed)
Anesthesia Post Note  Patient: Paul Webb  Procedure(s) Performed: NASAL SEPTOPLASTY WITH TURBINATE REDUCTION (Bilateral Nose) DRUG INDUCED ENDOSCOPY (N/A )     Patient location during evaluation: PACU Anesthesia Type: General Level of consciousness: awake and alert, awake and oriented Pain management: pain level controlled Vital Signs Assessment: post-procedure vital signs reviewed and stable Respiratory status: spontaneous breathing, nonlabored ventilation and respiratory function stable Cardiovascular status: blood pressure returned to baseline and stable Postop Assessment: no apparent nausea or vomiting Anesthetic complications: no   No complications documented.  Last Vitals:  Vitals:   02/27/21 1045 02/27/21 1127  BP: 116/62 122/73  Pulse: 67 66  Resp: 12 14  Temp:  36.6 C  SpO2: 91% 97%    Last Pain:  Vitals:   02/27/21 1107  TempSrc:   PainSc: 0-No pain                 Catalina Gravel

## 2021-02-27 NOTE — H&P (Signed)
Paul Webb is an 65 y.o. male.   Chief Complaint: Obstructive Sleep Apnea HPI: Hx of prog nasal obstruction  Past Medical History:  Diagnosis Date  . Arthritis   . 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 Essential hypertension 07/30/2012  . Fast heart beat   . Gastro-esophageal reflux disease with esophagitis 07/30/2012  . Generalized anxiety disorder 05/12/2017  . GERD (gastroesophageal reflux disease)   . History of kidney stones   . Hyperlipidemia 07/30/2012  . Major depressive disorder   . OSA (obstructive sleep apnea)    does not use CPAP  . Type 2 diabetes mellitus 07/30/2012    Past Surgical History:  Procedure Laterality Date  . COLONOSCOPY    . COLONOSCOPY N/A 10/06/2018   Procedure: COLONOSCOPY;  Surgeon: Rogene Houston, MD;  [facility];  Service: Endoscopy;  Laterality: N/A;  1:25  . FL INJ RT KNEE CT ARTHROGRAM ([facility] HX)    . POLYPECTOMY  10/06/2018   Procedure: POLYPECTOMY;  Surgeon: Rogene Houston, MD;  Location[facility];  Service: Endoscopy;;  colon   . Rod in lower leg     trauma  . Rt hernia repair      Family History  Adopted: Yes  Family history unknown: Yes   Social History:  reports that he has never smoked. He has never used smokeless tobacco. He reports that he does not drink alcohol and does not use drugs.  Allergies: No Known Allergies  Medications Prior to Admission  Medication Sig Dispense Refill  . aspirin EC 81 MG tablet Take 1 tablet (81 mg total) by mouth daily.    [name] atenolol (TENORMIN) 50 MG tablet Take 50 mg by mouth at bedtime.     [name] buPROPion (WELLBUTRIN XL) 300 MG 24 hr tablet Take 1 tablet (300 mg total) by mouth daily. 30 tablet 2  . cetirizine (ZYRTEC) 10 MG tablet Take 10 mg by mouth at bedtime.     . Cholecalciferol (VITAMIN D) 2000 units tablet Take 2,000 Units by mouth daily.    . fluticasone furoate-vilanterol (BREO ELLIPTA) 100-25 MCG/INH AEPB Inhale 1 puff into the lungs daily as needed  (shortness of breath).    . hydrOXYzine (ATARAX/VISTARIL) 25 MG tablet Take 1 tablet (25 mg total) by mouth daily as needed for anxiety. 90 tablet 0  . lisinopril-hydrochlorothiazide (PRINZIDE,ZESTORETIC) 20-25 MG tablet Take 1 tablet by mouth daily.    . meloxicam (MOBIC) 7.5 MG tablet Take by mouth.    . Multiple Vitamin (MULTIVITAMIN) tablet Take 1 tablet by mouth daily.    [name] NOVOLOG 100 UNIT/ML injection     . omeprazole (PRILOSEC) 40 MG capsule Take 40 mg by mouth daily.    . pravastatin (PRAVACHOL) 80 MG tablet Take 80 mg by mouth at bedtime.     . sertraline (ZOLOFT) 100 MG tablet Take 1.5 tablets (150 mg total) by mouth daily. 45 tablet 0  . traZODone (DESYREL) 100 MG tablet Take 1 tablet (100 mg total) by mouth at bedtime as needed for sleep. 30 tablet 2  . SYMLINPEN 60 1500 MCG/1.5ML SOPN       Results for orders placed or performed during the hospital encounter of 02/27/21 (from the past 48 hour(s))  Basic metabolic panel per protocol     Status: Abnormal   Collection Time: 02/25/21 11:40 AM  Result Value Ref Range   Sodium 139 135 - 145 mmol/L   Potassium 4.5 3.5 - 5.1 mmol/L   Chloride  105 98 - 111 mmol/L   CO2 27 22 - 32 mmol/L   Glucose, Bld 190 (H) 70 - 99 mg/dL    Comment: Glucose reference range applies only to samples taken after fasting for at least 8 hours.   BUN 26 (H) 8 - 23 mg/dL   Creatinine, Ser 0.95 0.61 - 1.24 mg/dL   Calcium 9.2 8.9 - 10.3 mg/dL   GFR, Estimated >60 >60 mL/min    Comment: (NOTE) Calculated using the CKD-EPI Creatinine Equation (2021)    Anion gap 7 5 - 15    Comment: Performed at [facility] [address] [place], [place] [address]  Glucose, capillary     Status: Abnormal   Collection Time: 02/27/21  7:34 AM  Result Value Ref Range   Glucose-Capillary 163 (H) 70 - 99 mg/dL    Comment: Glucose reference range applies only to samples taken after fasting for at least 8 hours.   No results found.  Review of Systems   Constitutional: Negative.   HENT: Positive for congestion.   Respiratory: Positive for apnea.   Cardiovascular: Negative.     Blood pressure (!) 150/74, pulse 65, temperature 98.7 F (37.1 C), temperature source Oral, resp. rate 16, height 5\' 11"  (1.803 m), weight 98.2 kg, SpO2 100 %. Physical Exam Constitutional:      Appearance: He is normal weight.  HENT:     Nose:     Comments: Deviated septum Cardiovascular:     Rate and Rhythm: Normal rate.  Pulmonary:     Effort: Pulmonary effort is normal.  Musculoskeletal:     Cervical back: Normal range of motion.  Neurological:     Mental Status: He is alert.      Assessment/Plan Adm for OP septoplasty, IT reduction and DISE  Jerrell Belfast, MD 02/27/2021, 8:31 AM

## 2021-02-27 NOTE — Anesthesia Procedure Notes (Signed)
Procedure Name: Intubation Performed by: Ezequiel Kayser, CRNA Pre-anesthesia Checklist: Patient identified, Emergency Drugs available, Suction available and Patient being monitored Patient Re-evaluated:Patient Re-evaluated prior to induction Oxygen Delivery Method: Circle System Utilized Preoxygenation: Pre-oxygenation with 100% oxygen Induction Type: IV induction Ventilation: Mask ventilation without difficulty Laryngoscope Size: Mac and 3 Grade View: Grade I Tube type: Oral Rae Tube size: 7.0 mm Number of attempts: 1 Airway Equipment and Method: Stylet and Oral airway Placement Confirmation: ETT inserted through vocal cords under direct vision,  positive ETCO2 and breath sounds checked- equal and bilateral Tube secured with: Tape Dental Injury: Teeth and Oropharynx as per pre-operative assessment

## 2021-02-27 NOTE — Discharge Instructions (Signed)
  Post Anesthesia Home Care Instructions  Activity: Get plenty of rest for the remainder of the day. A responsible individual must stay with you for 24 hours following the procedure.  For the next 24 hours, DO NOT: -Drive a car -Paediatric nurse -Drink alcoholic beverages -Take any medication unless instructed by your physician -Make any legal decisions or sign important papers.  Meals: Start with liquid foods such as gelatin or soup. Progress to regular foods as tolerated. Avoid greasy, spicy, heavy foods. If nausea and/or vomiting occur, drink only clear liquids until the nausea and/or vomiting subsides. Call your physician if vomiting continues.  Special Instructions/Symptoms: Your throat may feel dry or sore from the anesthesia or the breathing tube placed in your throat during surgery. If this causes discomfort, gargle with warm salt water. The discomfort should disappear within 24 hours.  If you had a scopolamine patch placed behind your ear for the management of post- operative nausea and/or vomiting:  1. The medication in the patch is effective for 72 hours, after which it should be removed.  Wrap patch in a tissue and discard in the trash. Wash hands thoroughly with soap and water. 2. You may remove the patch earlier than 72 hours if you experience unpleasant side effects which may include dry mouth, dizziness or visual disturbances. 3. Avoid touching the patch. Wash your hands with soap and water after contact with the patch.    No Tylenol until 1:40PM.

## 2021-02-27 NOTE — Op Note (Signed)
Operative Note: SEPTOPLASTY AND INFERIOR TURBINATE REDUCTION    DRUG INDUCED SLEEP ENDOSCOPY  Patient: Paul Webb record number: [id number]  Date:02/27/2021  Pre-operative Indications: 1. Deviated nasal septum with nasal airway obstruction     2.  Bilateral inferior turbinate hypertrophy     3.  Obstructive sleep apnea  Postoperative Indications: Same  Surgical Procedure: 1.  Nasal Septoplasty    2.  Bilateral Inferior Turbinate Reduction    3.  Drug-induced sleep endoscopy (DISE)  Anesthesia: GET  Surgeon: Delsa Bern, M.D.  Complications: None  EBL: 50 cc  Findings: Findings: There is no evidence of complete concentric palatal obstruction.  Anatomically the patient should be a candidate for hypoglossal nerve stimulation therapy. Severely deviated nasal septum with airway obstruction and bilateral inferior turbinate hypertrophy.  Surgical Procedure: The patient is brought to the operating room on 02/27/2021 and placed in supine position on the operating table.  Intravenous sedated anesthesia was established without difficulty using the standard drug-induced sleep endoscopy protocol. When the patient was adequately anesthetized, surgical timeout was performed and correct identification of the patient and the surgical procedure.    A propofol infusion was administered and the patient was monitored carefully to achieve a level of sedation appropriate for DISE.  The patient did not respond to verbal commands but still had spontaneous respiration, sleep disordered breathing and associated desaturations were observed.  With the patient under adequate sedated anesthesia the flexible nasal laryngoscope was passed without difficulty.  The patient's nasal cavity showed deviated nasal septum with significant obstruction.  The endoscope was then passed to visualize the velopharynx, oropharynx, tongue base and epiglottis to assess areas of obstruction.  Patient's airway showed  anterior to posterior obstruction from tongue base and palate.  No significant circumferential obstruction.   There was no evidence of complete concentric palatal obstruction and the patient appeared to be a candidate anatomically for hypoglossal nerve stimulation therapy.   The patient is brought to the operating room on 02/27/2021 and placed in supine position on the operating table. General endotracheal anesthesia was established without difficulty. When the patient was adequately anesthetized, surgical timeout was performed and correct identification of the patient and the surgical procedure. The patient's nose was then injected with 5 cc of 1% lidocaine 1 100,000 dilution epinephrine which was injected in a submucosal fashion. The patient's nose was then packed with Afrin-soaked cottonoid pledgets were left in place for approximately 10 minutes lateral vasoconstriction and hemostasis.  With the patient prepped draped and prepared for surgery, nasal septoplasty was begun.  A left anterior hemitransfixion incision was created and a mucoperichondrial flap was elevated from anterior to posterior on the left-hand side. The anterior cartilaginous septum was crossed at the midline and a mucoperichondrial flap was elevated on the patient's right.  Swivel knife was then used to resect the anterior and mid cartilaginous portion of the nasal septum.  Resected cartilage was morcellized and returned to the mucoperichondrial pocket at the occlusion of the surgical procedure.  Dissection was then carried out from anterior to posterior removing deviated bone and cartilage including a large septal spur the overlying mucosa was preserved.  With the septum brought to good midline position, the morselized cartilage was returned to the mucoperichondrial pocket and the soft tissue/mucosal flaps were reapproximated with interrupted 4-0 gut suture on a Keith needle in a horizontal mattressing fashion.  Anterior hemitransfixion  incision was closed with the same stitch.  Bilateral Doyle nasal septal splints were then placed  after the application of Bactroban ointment and sutured in position with a 3-0 Ethilon suture.  Attention was then turned to the inferior turbinates, bilateral inferior turbinate intramural cautery was performed with cautery setting at 68 W.  2 submucosal passes were made in each inferior turbinate.  After completing cautery, anterior vertical incisions were created and overlying soft tissue was elevated, a small amount of turbinate bone was resected.  The turbinates were then outfractured to create a more patent nasal passageway.  Surgical sponge count was correct. An oral gastric tube was passed and the stomach contents were aspirated. Patient was awakened from anesthetic and transferred from the operating room to the recovery room in stable condition. There were no complications and blood loss was 50cc.   Delsa Bern, M.D. [facility] 02/27/2021

## 2021-02-27 NOTE — Transfer of Care (Signed)
Immediate Anesthesia Transfer of Care Note  Patient: LENOX BINK  Procedure(s) Performed: NASAL SEPTOPLASTY WITH TURBINATE REDUCTION (Bilateral Nose) DRUG INDUCED ENDOSCOPY (N/A )  Patient Location: PACU  Anesthesia Type:General  Level of Consciousness: drowsy  Airway & Oxygen Therapy: Patient Spontanous Breathing and Patient connected to face mask oxygen  Post-op Assessment: Report given to RN and Post -op Vital signs reviewed and stable  Post vital signs: Reviewed and stable  Last Vitals:  Vitals Value Taken Time  BP 144/77 02/27/21 1001  Temp    Pulse 69 02/27/21 1004  Resp 12 02/27/21 1004  SpO2 95 % 02/27/21 1004  Vitals shown include unvalidated device data.  Last Pain:  Vitals:   02/27/21 0732  TempSrc: Oral  PainSc: 0-No pain      Patients Stated Pain Goal: 3 (37/79/39 [id number])  Complications: No complications documented.

## 2021-02-28 ENCOUNTER — Encounter (HOSPITAL_BASED_OUTPATIENT_CLINIC_OR_DEPARTMENT_OTHER): Payer: Self-pay | Admitting: Otolaryngology

## 2021-03-14 ENCOUNTER — Telehealth: Payer: Self-pay

## 2021-03-14 ENCOUNTER — Telehealth: Payer: Self-pay | Admitting: Orthopedic Surgery

## 2021-03-14 NOTE — Telephone Encounter (Signed)
  Mr. Eidem would like to proceed with arthroscopic surgery on the right shoulder to debride the labral tear and to perform a biceps tenodesis.    I called to schedule him for May 24th. Left message for them to call back let me know if this is good

## 2021-03-14 NOTE — Telephone Encounter (Signed)
LVM for patient to return call to get set up for a f/u appt with Dr. Brett Fairy

## 2021-03-14 NOTE — Telephone Encounter (Signed)
Less Woolsey wife called and left a voicemail during lunch 12:13pm  stating her husband has decided he is ready to do his surgery.  He wants to do it the week of May 23rd.   Please call him back at [phone]

## 2021-03-18 ENCOUNTER — Other Ambulatory Visit: Payer: Self-pay | Admitting: Orthopedic Surgery

## 2021-03-18 NOTE — Telephone Encounter (Signed)
I called him, we scheduled.

## 2021-03-18 NOTE — Telephone Encounter (Signed)
Paul Webb patient called and is wanting you to call her back   [phone]

## 2021-03-21 ENCOUNTER — Ambulatory Visit: Payer: Medicare PPO | Admitting: Neurology

## 2021-03-21 DIAGNOSIS — E1065 Type 1 diabetes mellitus with hyperglycemia: Secondary | ICD-10-CM | POA: Diagnosis not present

## 2021-03-29 DIAGNOSIS — M531 Cervicobrachial syndrome: Secondary | ICD-10-CM | POA: Diagnosis not present

## 2021-03-29 DIAGNOSIS — M9901 Segmental and somatic dysfunction of cervical region: Secondary | ICD-10-CM | POA: Diagnosis not present

## 2021-03-29 DIAGNOSIS — M47812 Spondylosis without myelopathy or radiculopathy, cervical region: Secondary | ICD-10-CM | POA: Diagnosis not present

## 2021-04-02 DIAGNOSIS — M531 Cervicobrachial syndrome: Secondary | ICD-10-CM | POA: Diagnosis not present

## 2021-04-02 DIAGNOSIS — M9901 Segmental and somatic dysfunction of cervical region: Secondary | ICD-10-CM | POA: Diagnosis not present

## 2021-04-02 DIAGNOSIS — M47812 Spondylosis without myelopathy or radiculopathy, cervical region: Secondary | ICD-10-CM | POA: Diagnosis not present

## 2021-04-04 ENCOUNTER — Telehealth: Payer: Self-pay | Admitting: Radiology

## 2021-04-04 NOTE — Telephone Encounter (Signed)
Pending: In RN review Tracking [phone]  On cohere, submitted prior auth request for upcoming surgery on shoulder

## 2021-04-05 DIAGNOSIS — M531 Cervicobrachial syndrome: Secondary | ICD-10-CM | POA: Diagnosis not present

## 2021-04-05 DIAGNOSIS — M9901 Segmental and somatic dysfunction of cervical region: Secondary | ICD-10-CM | POA: Diagnosis not present

## 2021-04-05 DIAGNOSIS — M47812 Spondylosis without myelopathy or radiculopathy, cervical region: Secondary | ICD-10-CM | POA: Diagnosis not present

## 2021-04-09 NOTE — Telephone Encounter (Signed)
Still in review

## 2021-04-10 DIAGNOSIS — M9901 Segmental and somatic dysfunction of cervical region: Secondary | ICD-10-CM | POA: Diagnosis not present

## 2021-04-10 DIAGNOSIS — M531 Cervicobrachial syndrome: Secondary | ICD-10-CM | POA: Diagnosis not present

## 2021-04-10 DIAGNOSIS — M47812 Spondylosis without myelopathy or radiculopathy, cervical region: Secondary | ICD-10-CM | POA: Diagnosis not present

## 2021-04-10 NOTE — Telephone Encounter (Signed)
Pending: In RN review Tracking [phone]

## 2021-04-11 NOTE — Pre-Procedure Instructions (Signed)
Spoke with Dr Briant Cedar concerning insulin pump and he states patient  can leave this on for sleep. We can determine if it needs to come off the morning of his procedure after checking CBG.

## 2021-04-11 NOTE — Patient Instructions (Signed)
Paul Webb  04/11/2021     @PREFPERIOPPHARMACY @   Your procedure is scheduled on  04/16/2021   Report to [facility] at  [place]   Call this number if you have problems the morning of surgery:  [phone]   Remember:  Do not eat or drink after midnight.                      Take these medicines the morning of surgery with A SIP OF WATER  Well butrin, hydroxyzine, mobic (if needed), prilosec, zoloft.  Use your inhaler before you come and bring your rescue inhaler with you.  You may leave your insulin pump on and treat it as your do any night before you sleep.  If your glucose is 70 or below the morning of your procedure, drink 1/2 cup of clear liquid containing sugar and recheck your glucose in 15 minutes, if your glucose is still 70 or below, call [phone] for instructions.  If your glucose is 300 or above the morning of your procedure, call [phone] for instructions.     Please brush your teeth.  Do not wear jewelry, make-up or nail polish.  Do not wear lotions, powders, or perfumes, or deodorant.  Do not shave 48 hours prior to surgery.  Men may shave face and neck.  Do not bring valuables to the hospital.  [facility] is not responsible for any belongings or valuables.  Contacts, dentures or bridgework may not be worn into surgery.  Leave your suitcase in the car.  After surgery it may be brought to your room.  For patients admitted to the hospital, discharge time will be determined by your treatment team.  Patients discharged the day of surgery will not be allowed to drive home and must have someone with them for 24 hours.    Place clean sheets on your bed the night before your procedure and DO NOT sleep with pets this night.  Shower with CHG the night before and the morning of your procedure and DO NOT use CHG on your face, hair or genitals.  After each shower, dry off with a clean towel, put on clean, comfortable clothes and brush your  teeth.    Special instructions:  DO NOT smoke tobacco or vape for 24 hours before your procedure.  Please read over the following fact sheets that you were given. Coughing and Deep Breathing, Surgical Site Infection Prevention, Anesthesia Post-op Instructions and Care and Recovery After Surgery       Shoulder Arthroscopy, Care After The following information offers guidance on how to care for yourself after your procedure. Your health care provider may also give you more specific instructions. If you have problems or questions, contact your health care provider. What can I expect after the procedure? After the procedure, it is common to have:  Pain.  Swelling.  A small amount of fluid from the incision.  Stiffness that improves over time. Follow these instructions at home: If you have a sling or an immobilizer:  Wear it as told by your health care provider. Remove it only as told by your health care provider. These devices protect your shoulder and help it heal by keeping it in place.  Check the skin around it every day. Tell your health care provider about any concerns.  Loosen it if your fingers tingle, become numb, or turn cold and blue.  Keep the sling or immobilizer clean.  If it is not waterproof: ? Do not let it get wet. ? Cover it with a watertight covering when you take a bath or a shower. Incision care  Follow instructions from your health care provider about how to take care of your incisions. Make sure you: ? Wash your hands with soap and water for at least 20 seconds before and after you change your bandage (dressing). If soap and water are not available, use hand sanitizer. ? Change your dressing as told by your health care provider. ? Leave stitches (sutures), skin glue, or adhesive strips in place. These skin closures may need to stay in place for 2 weeks or longer. If adhesive strip edges start to loosen and curl up, you may trim the loose edges. Do not  remove adhesive strips completely unless your health care provider tells you to do that.  Check your incision areas every day for signs of infection. Check for: ? Redness. ? More swelling or pain. ? Blood or more fluid. ? Warmth. ? Pus or a bad smell.  Do not take baths, swim, or use a hot tub until your health care provider approves. Ask your health care provider if you may take showers. You may only be allowed to take sponge baths.   Managing pain, stiffness, and swelling  If directed, put ice on the affected area. To do this: ? If you have a removable sling or immobilizer, remove it as told by your health care provider. ? Put ice in a plastic bag or use the icing device (cold therapy unit) that you were given. Follow instructions from your health care provider about how to use the icing device. ? Place a towel between your skin and the bag or between your skin and the icing device. ? Leave the ice on for 20 minutes, 2-3 times a day. ? Remove the ice if your skin turns bright red. This is very important. If you cannot feel pain, heat, or cold, you have a greater risk of damage to the area.  Move your fingers often to reduce stiffness and swelling.  Raise (elevate) the injured area above the level of your heart while you are lying down. ? It may help to sleep in a sitting position for a few days after your procedure. Try sleeping in a reclining chair or propping yourself up with extra pillows in bed.   Activity  Ask your health care provider what activities are safe for you during recovery.  Do not lift with your affected shoulder until your health care provider approves.  Avoid pulling and pushing with the arm on your affected side.  If physical therapy was prescribed, do exercises as directed. Doing exercises may help to improve shoulder movement and flexibility (range of motion). Driving  Ask your health care provider when it is safe to drive if you have a sling or  immobilizer.  Ask your health care provider if the medicine prescribed to you requires you to avoid driving or using machinery. General instructions  Take over-the-counter and prescription medicines only as told by your health care provider.  Ask your health care provider if the medicine prescribed to you can cause constipation. You may need to take these actions to prevent or treat constipation: ? Drink enough fluid to keep your urine pale yellow. ? Take over-the-counter or prescription medicines. ? Eat foods that are high in fiber, such as beans, whole grains, and fresh fruits and vegetables. ? Limit foods that are high in fat  and processed sugars, such as fried or sweet foods.  Do not use any products that contain nicotine or tobacco. These products include cigarettes, chewing tobacco, and vaping devices, such as e-cigarettes. These can delay incision healing after surgery. If you need help quitting, ask your health care provider.  Keep all follow-up visits. This is important. Contact a health care provider if:  You have a fever or chills.  You have severe pain.  You have any of these signs of infection: ? Redness around an incision. ? More swelling or pain in an incision area. ? Blood or more fluid coming from an incision. ? Warmth coming from an incision. ? Pus or a bad smell coming from an incision.  You notice that an incision has opened up.  You develop a rash. Get help right away if:  You have difficulty breathing.  You have chest pain.  You notice that your fingers tingle, are numb, or are cold and blue even after you loosen your sling or immobilizer.  You develop pain in your lower leg or at the back of your knee. These symptoms may represent a serious problem that is an emergency. Do not wait to see if the symptoms will go away. Get medical help right away. Call your local emergency services (911 in the [place] Do not drive yourself to the hospital. Summary  If  you have a sling or an immobilizer, wear it as told by your health care provider.  It may help to sleep in a sitting position for a few days after your procedure.  If physical therapy was prescribed, do exercises as directed. Doing exercises may help to improve shoulder movement and flexibility (range of motion).  Keep all follow-up visits. This is important. This information is not intended to replace advice given to you by your health care provider. Make sure you discuss any questions you have with your health care provider. Document Revised: 07/09/2020 Document Reviewed: 07/09/2020 Elsevier Patient Education  2021 [place] Anesthesia, Adult, Care After This sheet gives you information about how to care for yourself after your procedure. Your health care provider may also give you more specific instructions. If you have problems or questions, contact your health care provider. What can I expect after the procedure? After the procedure, the following side effects are common:  Pain or discomfort at the IV site.  Nausea.  Vomiting.  Sore throat.  Trouble concentrating.  Feeling cold or chills.  Feeling weak or tired.  Sleepiness and fatigue.  Soreness and body aches. These side effects can affect parts of the body that were not involved in surgery. Follow these instructions at home: For the time period you were told by your health care provider:  Rest.  Do not participate in activities where you could fall or become injured.  Do not drive or use machinery.  Do not drink alcohol.  Do not take sleeping pills or medicines that cause drowsiness.  Do not make important decisions or sign legal documents.  Do not take care of children on your own.   Eating and drinking  Follow any instructions from your health care provider about eating or drinking restrictions.  When you feel hungry, start by eating small amounts of foods that are soft and easy to digest  (bland), such as toast. Gradually return to your regular diet.  Drink enough fluid to keep your urine pale yellow.  If you vomit, rehydrate by drinking water, juice, or clear broth. General instructions  If you have sleep apnea, surgery and certain medicines can increase your risk for breathing problems. Follow instructions from your health care provider about wearing your sleep device: ? Anytime you are sleeping, including during daytime naps. ? While taking prescription pain medicines, sleeping medicines, or medicines that make you drowsy.  Have a responsible adult stay with you for the time you are told. It is important to have someone help care for you until you are awake and alert.  Return to your normal activities as told by your health care provider. Ask your health care provider what activities are safe for you.  Take over-the-counter and prescription medicines only as told by your health care provider.  If you smoke, do not smoke without supervision.  Keep all follow-up visits as told by your health care provider. This is important. Contact a health care provider if:  You have nausea or vomiting that does not get better with medicine.  You cannot eat or drink without vomiting.  You have pain that does not get better with medicine.  You are unable to pass urine.  You develop a skin rash.  You have a fever.  You have redness around your IV site that gets worse. Get help right away if:  You have difficulty breathing.  You have chest pain.  You have blood in your urine or stool, or you vomit blood. Summary  After the procedure, it is common to have a sore throat or nausea. It is also common to feel tired.  Have a responsible adult stay with you for the time you are told. It is important to have someone help care for you until you are awake and alert.  When you feel hungry, start by eating small amounts of foods that are soft and easy to digest (bland), such as  toast. Gradually return to your regular diet.  Drink enough fluid to keep your urine pale yellow.  Return to your normal activities as told by your health care provider. Ask your health care provider what activities are safe for you. This information is not intended to replace advice given to you by your health care provider. Make sure you discuss any questions you have with your health care provider. Document Revised: 07/26/2020 Document Reviewed: 02/23/2020 Elsevier Patient Education  2021 [employer].

## 2021-04-12 ENCOUNTER — Other Ambulatory Visit: Payer: Self-pay | Admitting: Orthopedic Surgery

## 2021-04-12 DIAGNOSIS — M75121 Complete rotator cuff tear or rupture of right shoulder, not specified as traumatic: Secondary | ICD-10-CM

## 2021-04-12 NOTE — Telephone Encounter (Signed)
Approved [id number]

## 2021-04-15 ENCOUNTER — Encounter (HOSPITAL_COMMUNITY)
Admission: RE | Admit: 2021-04-15 | Discharge: 2021-04-15 | Disposition: A | Discharge status: Home / Self Care | Payer: Medicare PPO | Source: Ambulatory Visit | Attending: Orthopedic Surgery | Admitting: Orthopedic Surgery

## 2021-04-15 ENCOUNTER — Other Ambulatory Visit (HOSPITAL_COMMUNITY)
Admission: RE | Admit: 2021-04-15 | Discharge: 2021-04-15 | Disposition: A | Discharge status: Home / Self Care | Payer: Medicare PPO | Source: Ambulatory Visit | Attending: Orthopedic Surgery | Admitting: Orthopedic Surgery

## 2021-04-15 ENCOUNTER — Other Ambulatory Visit: Payer: Self-pay

## 2021-04-15 DIAGNOSIS — Z01812 Encounter for preprocedural laboratory examination: Secondary | ICD-10-CM | POA: Insufficient documentation

## 2021-04-15 DIAGNOSIS — Z20822 Contact with and (suspected) exposure to covid-19: Secondary | ICD-10-CM | POA: Diagnosis not present

## 2021-04-15 LAB — CBC WITH DIFFERENTIAL/PLATELET
Abs Immature Granulocytes: 0.05 10*3/uL (ref 0.00–0.07)
Basophils Absolute: 0.1 10*3/uL (ref 0.0–0.1)
Basophils Relative: 1 %
Eosinophils Absolute: 0.6 10*3/uL — ABNORMAL HIGH (ref 0.0–0.5)
Eosinophils Relative: 6 %
HCT: 46.9 % (ref 39.0–52.0)
Hemoglobin: 15.5 g/dL (ref 13.0–17.0)
Immature Granulocytes: 1 %
Lymphocytes Relative: 17 %
Lymphs Abs: 1.5 10*3/uL (ref 0.7–4.0)
MCH: 29 pg (ref 26.0–34.0)
MCHC: 33 g/dL (ref 30.0–36.0)
MCV: 87.7 fL (ref 80.0–100.0)
Monocytes Absolute: 0.8 10*3/uL (ref 0.1–1.0)
Monocytes Relative: 9 %
Neutro Abs: 6 10*3/uL (ref 1.7–7.7)
Neutrophils Relative %: 66 %
Platelets: 211 10*3/uL (ref 150–400)
RBC: 5.35 MIL/uL (ref 4.22–5.81)
RDW: 12.4 % (ref 11.5–15.5)
WBC: 8.9 10*3/uL (ref 4.0–10.5)
nRBC: 0 % (ref 0.0–0.2)

## 2021-04-15 LAB — BASIC METABOLIC PANEL
Anion gap: 8 (ref 5–15)
BUN: 25 mg/dL — ABNORMAL HIGH (ref 8–23)
CO2: 26 mmol/L (ref 22–32)
Calcium: 9.1 mg/dL (ref 8.9–10.3)
Chloride: 104 mmol/L (ref 98–111)
Creatinine, Ser: 0.9 mg/dL (ref 0.61–1.24)
GFR, Estimated: >60 mL/min (ref >60)
Glucose, Bld: 131 mg/dL — ABNORMAL HIGH (ref 70–99)
Potassium: 3.8 mmol/L (ref 3.5–5.1)
Sodium: 138 mmol/L (ref 135–145)

## 2021-04-15 LAB — HEMOGLOBIN A1C
Hgb A1c MFr Bld: 6.4 % — ABNORMAL HIGH (ref 4.8–5.6)
Mean Plasma Glucose: 136.98 mg/dL

## 2021-04-15 LAB — SARS CORONAVIRUS 2 (TAT 6-24 HRS): SARS Coronavirus 2: NEGATIVE

## 2021-04-15 NOTE — H&P (Signed)
Chief Complaint  Patient presents with  . Shoulder Pain      Right since May 2021 painful to move arm, fell off roof       History 65 year old male with intermittent shoulder pain over the years recently fell off a roof last May and since that time his had increasing pain in his right shoulder, difficulty reaching above his head or reaching away from his body with increasing pain including pain behind his shoulder blade and in the right deltoid   Review of systems history of tremor worked up with neurologist no identifiable cause history of dizziness tremor nervousness muscle aches joint pain frequent falls   He is diabetic he has had a stroke he has some sleep apnea and arthritis   He is taken some meloxicam 7.5 mg       Past Medical History:  Diagnosis Date  . 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 Essential hypertension 07/30/2012  . Fast heart beat    . Gastro-esophageal reflux disease with esophagitis 07/30/2012  . Generalized anxiety disorder 05/12/2017  . History of kidney stones    . Hyperlipidemia 07/30/2012  . Major depressive disorder    . OSA (obstructive sleep apnea)    . Type 2 diabetes mellitus 07/30/2012         Past Surgical History:  Procedure Laterality Date  . COLONOSCOPY      . COLONOSCOPY N/A 10/06/2018    Procedure: COLONOSCOPY;  Surgeon: Rogene Houston, MD;  Location: [facility];  Service: Endoscopy;  Laterality: N/A;  1:25  . FL INJ RT KNEE CT ARTHROGRAM ([facility] HX)      . POLYPECTOMY   10/06/2018    Procedure: POLYPECTOMY;  Surgeon: Rogene Houston, MD;  Location: [facility];  Service: Endoscopy;;  colon    . Rod in lower leg        trauma  . Rt hernia repair          BP (!) 154/84   Pulse 67   Ht 5\' 11"  (1.803 m)   Wt 230 lb (104.3 kg)   BMI 32.08 kg/m    General normal appearance   Neurovascular exam intact   Awake and alert   Normal mood blunted affect   Normal strength left shoulder   Right shoulder:   No skin  abnormalities Tenderness superior border of the scapula medially as well as anterior rotator interval and bicipital groove   He has definite weakness in abduction and flexion passive range of motion normal   No shoulder instability   Images show break in Shenton's line of the shoulder mild narrowing glenohumeral joint    EXAM: MRI OF THE RIGHT SHOULDER WITHOUT CONTRAST  TECHNIQUE: Multiplanar, multisequence MR imaging of the shoulder was performed. No intravenous contrast was administered.  COMPARISON:  X-ray 01/31/2021  FINDINGS: Rotator cuff: Mild supraspinatus greater than infraspinatus tendinosis. Low-grade bursal sided fraying of the anterior aspect of the distal supraspinatus tendon without discernible tear. Subscapularis and teres minor tendons intact. No full thickness rotator cuff tear.  Muscles: Preserved bulk and signal intensity of the rotator cuff musculature without edema, atrophy, or fatty infiltration.  Biceps long head: Mild intra-articular biceps tendinosis with probable longitudinal split tear near the tendon anchor site (series 8, image 13).  Acromioclavicular Joint: Mild arthropathy of the AC joint. No subacromial-subdeltoid bursal fluid.  Glenohumeral Joint: Mild chondral thinning without focal defect. No joint effusion.  Labrum: Suspect posterosuperior labral tear (series 6, images 14-15). Posterior  labrum appears diminutive. Labral evaluation is limited in the absence of intra-articular fluid or contrast. No paralabral cyst.  Bones: No acute fracture or dislocation. No marrow replacing bone lesion.  Other: None.  IMPRESSION: 1. Mild supraspinatus greater than infraspinatus tendinosis. Low-grade bursal sided fraying of the anterior aspect of the distal supraspinatus tendon without discernible tear. 2. Suspect posterosuperior labral tear. 3. Mild intra-articular biceps tendinosis with probable longitudinal split tear near the  tendon anchor site.   Electronically Signed   By: Davina Poke D.O.   On: 02/15/2021 08:47   Plan arthroscopy right shoulder debridement and biceps tenodesis open

## 2021-04-16 ENCOUNTER — Ambulatory Visit (HOSPITAL_COMMUNITY)
Admission: RE | Admit: 2021-04-16 | Discharge: 2021-04-16 | Disposition: A | Discharge status: Home / Self Care | Payer: Medicare PPO | Attending: Orthopedic Surgery | Admitting: Orthopedic Surgery

## 2021-04-16 ENCOUNTER — Encounter (HOSPITAL_COMMUNITY)
Admission: RE | Disposition: A | Discharge status: Home / Self Care | Payer: Self-pay | Source: Home / Self Care | Attending: Orthopedic Surgery

## 2021-04-16 ENCOUNTER — Encounter: Payer: Self-pay | Admitting: Orthopedic Surgery

## 2021-04-16 ENCOUNTER — Ambulatory Visit (HOSPITAL_COMMUNITY): Payer: Medicare PPO | Admitting: Anesthesiology

## 2021-04-16 ENCOUNTER — Other Ambulatory Visit: Payer: Self-pay | Admitting: Orthopedic Surgery

## 2021-04-16 ENCOUNTER — Encounter (HOSPITAL_COMMUNITY): Payer: Self-pay | Admitting: Orthopedic Surgery

## 2021-04-16 DIAGNOSIS — S43491A Other sprain of right shoulder joint, initial encounter: Secondary | ICD-10-CM | POA: Insufficient documentation

## 2021-04-16 DIAGNOSIS — M7521 Bicipital tendinitis, right shoulder: Secondary | ICD-10-CM | POA: Diagnosis not present

## 2021-04-16 DIAGNOSIS — Z8673 Personal history of transient ischemic attack (TIA), and cerebral infarction without residual deficits: Secondary | ICD-10-CM | POA: Diagnosis not present

## 2021-04-16 DIAGNOSIS — M1990 Unspecified osteoarthritis, unspecified site: Secondary | ICD-10-CM | POA: Diagnosis not present

## 2021-04-16 DIAGNOSIS — Z79899 Other long term (current) drug therapy: Secondary | ICD-10-CM | POA: Diagnosis not present

## 2021-04-16 DIAGNOSIS — E119 Type 2 diabetes mellitus without complications: Secondary | ICD-10-CM | POA: Insufficient documentation

## 2021-04-16 DIAGNOSIS — W1789XA Other fall from one level to another, initial encounter: Secondary | ICD-10-CM | POA: Diagnosis not present

## 2021-04-16 DIAGNOSIS — G8918 Other acute postprocedural pain: Secondary | ICD-10-CM | POA: Diagnosis not present

## 2021-04-16 DIAGNOSIS — G4733 Obstructive sleep apnea (adult) (pediatric): Secondary | ICD-10-CM | POA: Insufficient documentation

## 2021-04-16 DIAGNOSIS — M7551 Bursitis of right shoulder: Secondary | ICD-10-CM | POA: Diagnosis not present

## 2021-04-16 DIAGNOSIS — M24111 Other articular cartilage disorders, right shoulder: Secondary | ICD-10-CM

## 2021-04-16 DIAGNOSIS — M75121 Complete rotator cuff tear or rupture of right shoulder, not specified as traumatic: Secondary | ICD-10-CM

## 2021-04-16 DIAGNOSIS — M75111 Incomplete rotator cuff tear or rupture of right shoulder, not specified as traumatic: Secondary | ICD-10-CM | POA: Insufficient documentation

## 2021-04-16 DIAGNOSIS — Z9181 History of falling: Secondary | ICD-10-CM | POA: Diagnosis not present

## 2021-04-16 DIAGNOSIS — F418 Other specified anxiety disorders: Secondary | ICD-10-CM | POA: Diagnosis not present

## 2021-04-16 HISTORY — PX: SHOULDER ARTHROSCOPY: SHX128

## 2021-04-16 LAB — GLUCOSE, CAPILLARY
Glucose-Capillary: 126 mg/dL — ABNORMAL HIGH (ref 70–99)
Glucose-Capillary: 80 mg/dL (ref 70–99)
Glucose-Capillary: 154 mg/dL — ABNORMAL HIGH (ref 70–99)

## 2021-04-16 SURGERY — ARTHROSCOPY, SHOULDER
Anesthesia: General | Site: Shoulder | Laterality: Right

## 2021-04-16 MED ORDER — BUPIVACAINE HCL (PF) 0.5 % IJ SOLN
INTRAMUSCULAR | Status: AC
  Filled 2021-04-16: qty 30

## 2021-04-16 MED ORDER — ROCURONIUM BROMIDE 100 MG/10ML IV SOLN
INTRAVENOUS | Status: DC | PRN
  Administered 2021-04-16: 20 mg via INTRAVENOUS
  Administered 2021-04-16: 50 mg via INTRAVENOUS

## 2021-04-16 MED ORDER — IBUPROFEN 800 MG PO TABS
800.0000 mg | ORAL_TABLET | Freq: Once | ORAL | Status: AC
  Administered 2021-04-16: 800 mg via ORAL
  Filled 2021-04-16: qty 1

## 2021-04-16 MED ORDER — EPINEPHRINE PF 1 MG/ML IJ SOLN
INTRAMUSCULAR | Status: AC
  Filled 2021-04-16: qty 6

## 2021-04-16 MED ORDER — EPHEDRINE 5 MG/ML INJ
INTRAVENOUS | Status: AC
  Filled 2021-04-16: qty 10

## 2021-04-16 MED ORDER — BUPIVACAINE-EPINEPHRINE 0.5% -1:200000 IJ SOLN
INTRAMUSCULAR | Status: DC | PRN
  Administered 2021-04-16: 30 mL

## 2021-04-16 MED ORDER — BUPIVACAINE-EPINEPHRINE (PF) 0.5% -1:200000 IJ SOLN
INTRAMUSCULAR | Status: AC
  Filled 2021-04-16: qty 30

## 2021-04-16 MED ORDER — CHLORHEXIDINE GLUCONATE 0.12 % MT SOLN
15.0000 mL | Freq: Once | OROMUCOSAL | Status: AC
  Administered 2021-04-16: 15 mL via OROMUCOSAL

## 2021-04-16 MED ORDER — PROPOFOL 10 MG/ML IV BOLUS
INTRAVENOUS | Status: DC | PRN
  Administered 2021-04-16: 200 mg via INTRAVENOUS

## 2021-04-16 MED ORDER — ONDANSETRON HCL 4 MG/2ML IJ SOLN
INTRAMUSCULAR | Status: AC
  Filled 2021-04-16: qty 2

## 2021-04-16 MED ORDER — SODIUM CHLORIDE 0.9 % IR SOLN
Status: DC | PRN
  Administered 2021-04-16: 1000 mL

## 2021-04-16 MED ORDER — LIDOCAINE HCL (PF) 2 % IJ SOLN
INTRAMUSCULAR | Status: AC
  Filled 2021-04-16: qty 5

## 2021-04-16 MED ORDER — FENTANYL CITRATE (PF) 100 MCG/2ML IJ SOLN
INTRAMUSCULAR | Status: DC | PRN
  Administered 2021-04-16: 100 ug via INTRAVENOUS

## 2021-04-16 MED ORDER — DEXAMETHASONE SODIUM PHOSPHATE 10 MG/ML IJ SOLN
INTRAMUSCULAR | Status: AC
  Filled 2021-04-16: qty 1

## 2021-04-16 MED ORDER — PROPOFOL 10 MG/ML IV BOLUS
INTRAVENOUS | Status: AC
  Filled 2021-04-16: qty 20

## 2021-04-16 MED ORDER — LIDOCAINE HCL (PF) 1 % IJ SOLN
INTRAMUSCULAR | Status: DC | PRN
  Administered 2021-04-16: 3 mL

## 2021-04-16 MED ORDER — MIDAZOLAM HCL 2 MG/2ML IJ SOLN
2.0000 mg | Freq: Once | INTRAMUSCULAR | Status: DC
  Filled 2021-04-16: qty 2

## 2021-04-16 MED ORDER — ONDANSETRON HCL 4 MG/2ML IJ SOLN: 4.0000 mg | Freq: Four times a day (QID) | INTRAMUSCULAR | Status: DC | PRN

## 2021-04-16 MED ORDER — SODIUM CHLORIDE 0.9 % IR SOLN
Status: DC | PRN
  Administered 2021-04-16 (×2): 3000 mL

## 2021-04-16 MED ORDER — SUGAMMADEX SODIUM 500 MG/5ML IV SOLN
INTRAVENOUS | Status: AC
  Filled 2021-04-16: qty 5

## 2021-04-16 MED ORDER — DEXTROSE 50 % IV SOLN
25.0000 mL | Freq: Once | INTRAVENOUS | Status: AC
  Administered 2021-04-16: 25 mL via INTRAVENOUS

## 2021-04-16 MED ORDER — MIDAZOLAM HCL 2 MG/2ML IJ SOLN
INTRAMUSCULAR | Status: AC
  Filled 2021-04-16: qty 2

## 2021-04-16 MED ORDER — LIDOCAINE HCL (PF) 1 % IJ SOLN
INTRAMUSCULAR | Status: AC
  Filled 2021-04-16: qty 30

## 2021-04-16 MED ORDER — ACETAMINOPHEN 500 MG PO TABS
500.0000 mg | ORAL_TABLET | Freq: Once | ORAL | Status: AC
  Administered 2021-04-16: 500 mg via ORAL
  Filled 2021-04-16: qty 1

## 2021-04-16 MED ORDER — PHENYLEPHRINE 40 MCG/ML (10ML) SYRINGE FOR IV PUSH (FOR BLOOD PRESSURE SUPPORT)
PREFILLED_SYRINGE | INTRAVENOUS | Status: AC
  Filled 2021-04-16: qty 10

## 2021-04-16 MED ORDER — CEFAZOLIN SODIUM-DEXTROSE 2-4 GM/100ML-% IV SOLN
2.0000 g | INTRAVENOUS | Status: AC
  Administered 2021-04-16: 2 g via INTRAVENOUS

## 2021-04-16 MED ORDER — HYDROMORPHONE HCL 1 MG/ML IJ SOLN: 0.2500 mg | INTRAMUSCULAR | Status: DC | PRN

## 2021-04-16 MED ORDER — EPHEDRINE SULFATE 50 MG/ML IJ SOLN
INTRAMUSCULAR | Status: DC | PRN
  Administered 2021-04-16 (×7): 10 mg via INTRAVENOUS

## 2021-04-16 MED ORDER — CEFAZOLIN SODIUM-DEXTROSE 2-4 GM/100ML-% IV SOLN
INTRAVENOUS | Status: AC
  Filled 2021-04-16: qty 100

## 2021-04-16 MED ORDER — LIDOCAINE HCL (CARDIAC) PF 100 MG/5ML IV SOSY
PREFILLED_SYRINGE | INTRAVENOUS | Status: DC | PRN
  Administered 2021-04-16: 100 mg via INTRAVENOUS

## 2021-04-16 MED ORDER — SUGAMMADEX SODIUM 200 MG/2ML IV SOLN
INTRAVENOUS | Status: DC | PRN
  Administered 2021-04-16: 400 mg via INTRAVENOUS

## 2021-04-16 MED ORDER — PHENYLEPHRINE HCL (PRESSORS) 10 MG/ML IV SOLN
INTRAVENOUS | Status: AC
  Filled 2021-04-16: qty 1

## 2021-04-16 MED ORDER — FENTANYL CITRATE (PF) 250 MCG/5ML IJ SOLN
INTRAMUSCULAR | Status: AC
  Filled 2021-04-16: qty 5

## 2021-04-16 MED ORDER — DEXTROSE 50 % IV SOLN
INTRAVENOUS | Status: AC
  Filled 2021-04-16: qty 50

## 2021-04-16 MED ORDER — ORAL CARE MOUTH RINSE: 15.0000 mL | Freq: Once | OROMUCOSAL | Status: AC

## 2021-04-16 MED ORDER — LACTATED RINGERS IV SOLN: INTRAVENOUS | Status: DC

## 2021-04-16 MED ORDER — ONDANSETRON HCL 4 MG/2ML IJ SOLN: 4.0000 mg | Freq: Once | INTRAMUSCULAR | Status: DC | PRN

## 2021-04-16 MED ORDER — PHENYLEPHRINE HCL (PRESSORS) 10 MG/ML IV SOLN
INTRAVENOUS | Status: DC | PRN
  Administered 2021-04-16: 120 ug via INTRAVENOUS
  Administered 2021-04-16: 40 ug via INTRAVENOUS
  Administered 2021-04-16 (×2): 120 ug via INTRAVENOUS

## 2021-04-16 MED ORDER — MIDAZOLAM HCL 5 MG/5ML IJ SOLN
INTRAMUSCULAR | Status: DC | PRN
  Administered 2021-04-16: 2 mg via INTRAVENOUS

## 2021-04-16 MED ORDER — PROMETHAZINE HCL 12.5 MG PO TABS: 12.5000 mg | ORAL_TABLET | Freq: Four times a day (QID) | ORAL | 0 refills | Status: DC | PRN

## 2021-04-16 MED ORDER — ONDANSETRON HCL 4 MG/2ML IJ SOLN
INTRAMUSCULAR | Status: DC | PRN
  Administered 2021-04-16: 4 mg via INTRAVENOUS

## 2021-04-16 MED ORDER — HYDROCODONE-ACETAMINOPHEN 5-325 MG PO TABS: 1.0000 | ORAL_TABLET | Freq: Four times a day (QID) | ORAL | 0 refills | Status: DC | PRN

## 2021-04-16 MED ORDER — ROCURONIUM BROMIDE 10 MG/ML (PF) SYRINGE
PREFILLED_SYRINGE | INTRAVENOUS | Status: AC
  Filled 2021-04-16: qty 10

## 2021-04-16 MED ORDER — MEPERIDINE HCL 50 MG/ML IJ SOLN: 6.2500 mg | INTRAMUSCULAR | Status: DC | PRN

## 2021-04-16 MED ORDER — DEXAMETHASONE SODIUM PHOSPHATE 10 MG/ML IJ SOLN
INTRAMUSCULAR | Status: DC | PRN
  Administered 2021-04-16: 5 mg via INTRAVENOUS

## 2021-04-16 MED ORDER — BUPIVACAINE HCL (PF) 0.5 % IJ SOLN
INTRAMUSCULAR | Status: DC | PRN
  Administered 2021-04-16: 24 mL via PERINEURAL

## 2021-04-16 SURGICAL SUPPLY — 45 items
APL PRP STRL LF DISP 70% ISPRP (MISCELLANEOUS) ×1
BLADE SHAVER TORPEDO 4X13 (MISCELLANEOUS) ×2 IMPLANT
BLADE SURG SZ11 CARB STEEL (BLADE) ×2 IMPLANT
CHLORAPREP W/TINT 26 (MISCELLANEOUS) ×2 IMPLANT
CLOTH BEACON ORANGE TIMEOUT ST (SAFETY) ×2 IMPLANT
COVER LIGHT HANDLE STERIS (MISCELLANEOUS) ×4 IMPLANT
COVER WAND RF STERILE (DRAPES) ×2 IMPLANT
DECANTER SPIKE VIAL GLASS SM (MISCELLANEOUS) ×2 IMPLANT
DRAPE HALF SHEET 40X57 (DRAPES) ×2 IMPLANT
DRAPE SHOULDER BEACH CHAIR (DRAPES) ×2 IMPLANT
DRAPE U-SHAPE 47X51 STRL (DRAPES) ×2 IMPLANT
DRESSING MEPILEX BORDER 6X8 (GAUZE/BANDAGES/DRESSINGS) ×1 IMPLANT
DRSG MEPILEX BORDER 6X8 (GAUZE/BANDAGES/DRESSINGS) ×2
DRSG TEGADERM 2-3/8X2-3/4 SM (GAUZE/BANDAGES/DRESSINGS) ×2 IMPLANT
ELECT REM PT RETURN 9FT ADLT (ELECTROSURGICAL) ×2
ELECTRODE REM PT RTRN 9FT ADLT (ELECTROSURGICAL) ×1 IMPLANT
GLOVE SKINSENSE NS SZ8.0 LF (GLOVE) ×2
GLOVE SKINSENSE STRL SZ8.0 LF (GLOVE) ×2 IMPLANT
GLOVE SS N UNI LF 8.5 STRL (GLOVE) ×2 IMPLANT
GLOVE SURG UNDER POLY LF SZ7 (GLOVE) ×6 IMPLANT
GOWN STRL REUS W/TWL LRG LVL3 (GOWN DISPOSABLE) ×2 IMPLANT
GOWN STRL REUS W/TWL XL LVL3 (GOWN DISPOSABLE) ×2 IMPLANT
IV NS IRRIG 3000ML ARTHROMATIC (IV SOLUTION) ×4 IMPLANT
KIT BLADEGUARD II DBL (SET/KITS/TRAYS/PACK) ×2 IMPLANT
KIT POSITION SHOULDER SCHLEI (MISCELLANEOUS) ×2 IMPLANT
KIT TURNOVER KIT A (KITS) ×2 IMPLANT
MANIFOLD NEPTUNE II (INSTRUMENTS) ×2 IMPLANT
MARKER SKIN DUAL TIP RULER LAB (MISCELLANEOUS) ×2 IMPLANT
NEEDLE HYPO 21X1.5 SAFETY (NEEDLE) ×2 IMPLANT
NEEDLE SCORPION MULTI FIRE (NEEDLE) ×2 IMPLANT
NEEDLE SPNL 18GX3.5 QUINCKE PK (NEEDLE) ×2 IMPLANT
NS IRRIG 1000ML POUR BTL (IV SOLUTION) ×2 IMPLANT
PACK TOTAL JOINT (CUSTOM PROCEDURE TRAY) ×2 IMPLANT
PAD ARMBOARD 7.5X6 YLW CONV (MISCELLANEOUS) ×4 IMPLANT
PORT APPOLLO RF 90DEGREE MULTI (SURGICAL WAND) ×2 IMPLANT
SET ARTHROSCOPY INST (INSTRUMENTS) ×2 IMPLANT
SET BASIN LINEN APH (SET/KITS/TRAYS/PACK) ×2 IMPLANT
SLING ARM IMMOBILIZER XL (CAST SUPPLIES) ×2 IMPLANT
SPONGE GAUZE 2X2 8PLY STRL LF (GAUZE/BANDAGES/DRESSINGS) ×4 IMPLANT
SUT ETHILON 3 0 FSL (SUTURE) ×2 IMPLANT
SYR 30ML LL (SYRINGE) ×2 IMPLANT
SYR BULB IRRIG 60ML STRL (SYRINGE) ×2 IMPLANT
TOWEL OR 17X26 4PK STRL BLUE (TOWEL DISPOSABLE) ×2 IMPLANT
TUBE CONNECTING 12X1/4 (SUCTIONS) ×2 IMPLANT
TUBING IN/OUT FLOW W/MAIN PUMP (TUBING) ×2 IMPLANT

## 2021-04-16 NOTE — Progress Notes (Signed)
0981- pre-operative nerve block - right shoulder by Dr. Charna Elizabeth.  Time out done - Tressie Stalker CRNA, Riki Sheer CRNA, Dr. Charna Elizabeth, Selena Lesser RN.  Patient ID'd,  operative site is marked - right shoulder, consent verified, allergies noted - no concerns.  0725 block started. 0734 block completed.  Patient remained stable thru-out.

## 2021-04-16 NOTE — Progress Notes (Signed)
Pt. up to chair. Dr. Charna Elizabeth ok with O2 sats 90-92%. Pt. OK to DC home. Pt. instructed on incentive spirometer & instructed pt. to sleep upright in recliner. Pt. verbalized understanding.

## 2021-04-16 NOTE — Progress Notes (Signed)
Per Arbie Cookey in the office, patient is to call office later this afternoon or tomorrow for follow-up appointment.

## 2021-04-16 NOTE — Brief Op Note (Signed)
04/16/2021  9:13 AM  PATIENT:  Paul Webb  65 y.o. male  PRE-OPERATIVE DIAGNOSIS:  Right shoulder labral tear and biceps tendonitis  POST-OPERATIVE DIAGNOSIS:  Right shoulder labral tear and biceps tendonitis  PROCEDURE:  Procedure(s): RIGHT SHOULDER ARTHOROSCOPY AND LIMITED DEBRIDEMENT (Right) -76226 Debridement included 1 degenerative labrum 2 subacromial bursa   Operative findings:   torn superior labrum anterior and posterior degenerative TEAR;  intact biceps anchor, curtain or drape variant biceps attachment  Normal glenohumeral joint.  Normal subscapularis attachment.    Bursitis subacromial space, partial-thickness anterior bursal sided rotator cuff tear 2 to 3 mm depth.  Normal biceps tendon including normal bicipital groove  Normal range of motion preop  Assisted by Paul Webb  Anesthesia preop paracervical block, general anesthesia   SURGEON:  Surgeon(s) and Role:    Paul Civil, MD - Primary  Paul Webb had a preop paracervical block by anesthesia.  After evaluation he was cleared for surgery and the surgical site was marked as right shoulder and taken to the operating room.  After general anesthesia he had a exam under anesthesia which showed no limitations in his range of motion no instability  After sterile prep and drape and timeout standard posterior portal was placed the scope was placed into the joint.  Diagnostic arthroscopy was completed.  Most notable finding was a posterior superior and anterior superior labral degenerative tear.  An anterior portal was established and the labral tear was debrided and then reevaluated.  The patient had a drape or curtain type variant attachment.  Once the labrum was lifted the remaining fibers remained intact the superior cartilage was normal.  The scope was advanced into the bicipital groove where there was no evidence of tearing or splitting as seen on MRI  Glenohumeral joint was otherwise normal  including subscapularis attachment normal glenoid labrum humerus and glenoid cartilage  The scope was then placed into the subacromial space where a thickened bursa had formed.  A lateral portal was placed instruments were placed through the lateral portal and a bursectomy was performed.  The rotator cuff was evaluated there is a 2 to 3 mm depth partial-thickness anterior rotator cuff tear as seen on MRI.  After debridement the tear was otherwise intact  Subacromial space was irrigated the portals were closed with 3-0 nylon suture and the acromial space was injected with 10 cc of Marcaine and epinephrine  Sterile bandage was applied with a sling and swathe  Patient was extubated and taken recovery room in stable condition  PHYSICIAN ASSISTANT:   EBL:  50 mL   BLOOD ADMINISTERED:none  DRAINS: none   LOCAL MEDICATIONS USED:  MARCAINE     SPECIMEN:  No Specimen  DISPOSITION OF SPECIMEN:  N/A  COUNTS:  YES  TOURNIQUET:  * No tourniquets in log *  DICTATION: .Dragon Dictation  PLAN OF CARE: Discharge to home after PACU  PATIENT DISPOSITION:  PACU - hemodynamically stable.   Delay start of Pharmacological VTE agent (>24hrs) due to surgical blood loss or risk of bleeding: not applicable

## 2021-04-16 NOTE — Anesthesia Procedure Notes (Signed)
Anesthesia Regional Block: Interscalene brachial plexus block   Pre-Anesthetic Checklist: ,, timeout performed, Correct Patient, Correct Site, Correct Laterality, Correct Procedure, Correct Position, site marked, Risks and benefits discussed, at surgeon's request and post-op pain management  Laterality: Upper and Right  Prep: chloraprep       Needles:  Injection technique: Single-shot  Needle Type: Echogenic Needle     Needle Length: 8.3cm  Needle Gauge: 22   Needle insertion depth: 5 cm   Additional Needles:   Procedures: Doppler guided, nerve stimulator,,,,,,,   Nerve Stimulator or Paresthesia:  Response: no Twitch elicited, 0.5 mA, 0.3 ms, 5 cm  Additional Responses:   Narrative:  Start time: 04/16/2021 7:21 AM End time: 04/16/2021 7:30 AM  Performed by: Personally  Anesthesiologist: Denese Killings, MD  Additional Notes: Block assessed prior to start of surgery

## 2021-04-16 NOTE — Anesthesia Preprocedure Evaluation (Addendum)
Anesthesia Evaluation  Patient identified by MRN, date of birth, ID band Patient awake    Reviewed: Allergy & Precautions, NPO status , Patient's Chart, lab work & pertinent test results  History of Anesthesia Complications Negative for: history of anesthetic complications  Airway Mallampati: III  TM Distance: >3 FB Neck ROM: Full    Dental  (+) Dental Advisory Given, Teeth Intact   Pulmonary sleep apnea ,    Pulmonary exam normal breath sounds clear to auscultation       Cardiovascular Exercise Tolerance: Good hypertension, Pt. on medications and Pt. on home beta blockers Normal cardiovascular exam Rhythm:Regular Rate:Normal     Neuro/Psych PSYCHIATRIC DISORDERS Anxiety Depression    GI/Hepatic GERD  Medicated,  Endo/Other  diabetes, Well Controlled, Type 2, Insulin Dependent  Renal/GU      Musculoskeletal  (+) Arthritis , Osteoarthritis,    Abdominal   Peds  Hematology   Anesthesia Other Findings   Reproductive/Obstetrics                            Anesthesia Physical Anesthesia Plan  ASA: III  Anesthesia Plan: General   Post-op Pain Management:  Regional for Post-op pain   Induction: Intravenous  PONV Risk Score and Plan: 3 and Ondansetron, Midazolam and Metaclopromide  Airway Management Planned: Oral ETT and Video Laryngoscope Planned  Additional Equipment:   Intra-op Plan:   Post-operative Plan: Extubation in OR  Informed Consent: I have reviewed the patients History and Physical, chart, labs and discussed the procedure including the risks, benefits and alternatives for the proposed anesthesia with the patient or authorized representative who has indicated his/her understanding and acceptance.     Dental advisory given  Plan Discussed with: CRNA and Surgeon  Anesthesia Plan Comments:        Anesthesia Quick Evaluation

## 2021-04-16 NOTE — Transfer of Care (Signed)
Immediate Anesthesia Transfer of Care Note  Patient: Paul Webb  Procedure(s) Performed: RIGHT SHOULDER ARTHOROSCOPY AND DEBRIDEMENT (Right Shoulder)  Patient Location: PACU  Anesthesia Type:General  Level of Consciousness: awake, alert  and oriented  Airway & Oxygen Therapy: Patient Spontanous Breathing and Patient connected to nasal cannula oxygen  Post-op Assessment: Report given to RN and Post -op Vital signs reviewed and stable  Post vital signs: Reviewed and stable  Last Vitals:  Vitals Value Taken Time  BP 163/74   Temp    Pulse 78   Resp 14   SpO2 93 on 2L [place]     Last Pain:  Vitals:   04/16/21 0647  TempSrc: Oral      Patients Stated Pain Goal: 6 (01/60/10 9323)  Complications: No complications documented.

## 2021-04-16 NOTE — Anesthesia Postprocedure Evaluation (Signed)
Anesthesia Post Note  Patient: Paul Webb  Procedure(s) Performed: RIGHT SHOULDER ARTHOROSCOPY AND DEBRIDEMENT (Right Shoulder)  Patient location during evaluation: Phase II Anesthesia Type: General Level of consciousness: awake and alert and oriented Pain management: pain level controlled Vital Signs Assessment: post-procedure vital signs reviewed and stable Respiratory status: spontaneous breathing and respiratory function stable (Saturations 90 to 92 on room air) Cardiovascular status: blood pressure returned to baseline and stable Postop Assessment: no apparent nausea or vomiting Anesthetic complications: no   No complications documented.   Last Vitals:  Vitals:   04/16/21 1045 04/16/21 1051  BP:  131/76  Pulse: 82 82  Resp: 16 12  Temp:  36.7 C  SpO2: (!) 89% 90%    Last Pain:  Vitals:   04/16/21 1051  TempSrc: Oral  PainSc: 0-No pain                 Koraline Phillipson C Arian Mcquitty

## 2021-04-16 NOTE — Interval H&P Note (Signed)
History and Physical Interval Note:  04/16/2021 7:21 AM  Paul Webb  has presented today for surgery, with the diagnosis of Right shoulder labral tear and biceps tendonitis.  The various methods of treatment have been discussed with the patient and family. After consideration of risks, benefits and other options for treatment, the patient has consented to  Procedure(s): ARTHROSCOPY SHOULDER DEBRIDE LABRAL TEAR AND BICEPS TENODESIS (Right) as a surgical intervention.  The patient's history has been reviewed, patient examined, no change in status, stable for surgery.  I have reviewed the patient's chart and labs.  Questions were answered to the patient's satisfaction.     Arther Abbott

## 2021-04-16 NOTE — Op Note (Signed)
04/16/2021  9:13 AM  PATIENT:  Paul Webb  66 y.o. male  PRE-OPERATIVE DIAGNOSIS:  Right shoulder labral tear and biceps tendonitis  POST-OPERATIVE DIAGNOSIS:  Right shoulder labral tear and biceps tendonitis  PROCEDURE:  Procedure(s): RIGHT SHOULDER ARTHOROSCOPY AND LIMITED DEBRIDEMENT (Right) -17408 Debridement included 1 degenerative labrum 2 subacromial bursa   Operative findings:   torn superior labrum anterior and posterior degenerative TEAR;  intact biceps anchor, curtain or drape variant biceps attachment  Normal glenohumeral joint.  Normal subscapularis attachment.    Bursitis subacromial space, partial-thickness anterior bursal sided rotator cuff tear 2 to 3 mm depth.  Normal biceps tendon including normal bicipital groove  Normal range of motion preop  Assisted by Paul Webb  Anesthesia preop paracervical block, general anesthesia   SURGEON:  Surgeon(s) and Role:    Carole Civil, MD - Primary  Paul Webb had a preop paracervical block by anesthesia.  After evaluation he was cleared for surgery and the surgical site was marked as right shoulder and taken to the operating room.  After general anesthesia he had a exam under anesthesia which showed no limitations in his range of motion no instability  After sterile prep and drape and timeout standard posterior portal was placed the scope was placed into the joint.  Diagnostic arthroscopy was completed.  Most notable finding was a posterior superior and anterior superior labral degenerative tear.  An anterior portal was established and the labral tear was debrided and then reevaluated.  The patient had a drape or curtain type variant attachment.  Once the labrum was lifted the remaining fibers remained intact the superior cartilage was normal.  The scope was advanced into the bicipital groove where there was no evidence of tearing or splitting as seen on MRI  Glenohumeral joint was otherwise normal  including subscapularis attachment normal glenoid labrum humerus and glenoid cartilage  The scope was then placed into the subacromial space where a thickened bursa had formed.  A lateral portal was placed instruments were placed through the lateral portal and a bursectomy was performed.  The rotator cuff was evaluated there is a 2 to 3 mm depth partial-thickness anterior rotator cuff tear as seen on MRI.  After debridement the tear was otherwise intact  Subacromial space was irrigated the portals were closed with 3-0 nylon suture and the acromial space was injected with 10 cc of Marcaine and epinephrine  Sterile bandage was applied with a sling and swathe  Patient was extubated and taken recovery room in stable condition  PHYSICIAN ASSISTANT:   EBL:  50 mL   BLOOD ADMINISTERED:none  DRAINS: none   LOCAL MEDICATIONS USED:  MARCAINE     SPECIMEN:  No Specimen  DISPOSITION OF SPECIMEN:  N/A  COUNTS:  YES  TOURNIQUET:  * No tourniquets in log *  DICTATION: .Dragon Dictation  PLAN OF CARE: Discharge to home after PACU  PATIENT DISPOSITION:  PACU - hemodynamically stable.   Delay start of Pharmacological VTE agent (>24hrs) due to surgical blood loss or risk of bleeding: not applicable

## 2021-04-16 NOTE — Anesthesia Procedure Notes (Signed)
Procedure Name: Intubation Date/Time: 04/16/2021 7:44 AM Performed by: Riki Sheer, CRNA Pre-anesthesia Checklist: Patient identified, Emergency Drugs available, Suction available, Patient being monitored and Timeout performed Patient Re-evaluated:Patient Re-evaluated prior to induction Oxygen Delivery Method: Circle system utilized Preoxygenation: Pre-oxygenation with 100% oxygen Induction Type: IV induction Ventilation: Oral airway inserted - appropriate to patient size and Mask ventilation with difficulty Laryngoscope Size: Miller and 3 Grade View: Grade I Tube type: Oral Tube size: 7.5 mm Number of attempts: 1 Airway Equipment and Method: Stylet Placement Confirmation: ETT inserted through vocal cords under direct vision,  positive ETCO2,  CO2 detector and breath sounds checked- equal and bilateral Secured at: 22 cm Tube secured with: Tape Dental Injury: Teeth and Oropharynx as per pre-operative assessment and Injury to lip  Comments: Small laceration on lower left lip

## 2021-04-17 ENCOUNTER — Encounter (HOSPITAL_COMMUNITY): Payer: Self-pay | Admitting: Orthopedic Surgery

## 2021-04-20 DIAGNOSIS — E1065 Type 1 diabetes mellitus with hyperglycemia: Secondary | ICD-10-CM | POA: Diagnosis not present

## 2021-04-23 DIAGNOSIS — Z9889 Other specified postprocedural states: Secondary | ICD-10-CM

## 2021-04-23 HISTORY — DX: Other specified postprocedural states: Z98.890

## 2021-04-24 ENCOUNTER — Encounter: Payer: Self-pay | Admitting: Orthopedic Surgery

## 2021-04-24 ENCOUNTER — Ambulatory Visit (INDEPENDENT_AMBULATORY_CARE_PROVIDER_SITE_OTHER): Payer: Medicare PPO | Admitting: Orthopedic Surgery

## 2021-04-24 ENCOUNTER — Other Ambulatory Visit: Payer: Self-pay

## 2021-04-24 VITALS — Ht 71.0 in | Wt 215.0 lb

## 2021-04-24 DIAGNOSIS — Z9889 Other specified postprocedural states: Secondary | ICD-10-CM

## 2021-04-24 NOTE — Progress Notes (Signed)
POST OP   Chief Complaint  Patient presents with  . Shoulder Pain    R/ DOS 04/16/21// doing good, just a little pain everyday.   Postop visit #1 patient says he is doing well  PRE-OPERATIVE DIAGNOSIS:  Right shoulder labral tear and biceps tendonitis  POST-OPERATIVE DIAGNOSIS:  Right shoulder labral tear and biceps tendonitis  PROCEDURE:  Procedure(s): RIGHT SHOULDER ARTHOROSCOPY AND LIMITED DEBRIDEMENT (Right) -12751 Debridement included 1 degenerative labrum 2 subacromial bursa   Operative findings:   torn superior labrum anterior and posterior degenerative TEAR;  intact biceps anchor, curtain or drape variant biceps attachment  Normal glenohumeral joint.  Normal subscapularis attachment.    Bursitis subacromial space, partial-thickness anterior bursal sided rotator cuff tear 2 to 3 mm depth.  Normal biceps tendon including normal bicipital groove  Normal range of motion preop  There are no signs of infection or swelling patient looks comfortable.  Sutures were removed.  Patient can start physical therapy follow-up in 4 weeks

## 2021-04-24 NOTE — Patient Instructions (Signed)
Start physical therapy in [place] 3 times a week for 4 weeks  Sling as needed for comfort  No heavy lifting for the next 4 weeks

## 2021-04-26 ENCOUNTER — Telehealth: Payer: Self-pay | Admitting: Orthopedic Surgery

## 2021-04-26 DIAGNOSIS — Z9889 Other specified postprocedural states: Secondary | ICD-10-CM

## 2021-04-26 NOTE — Telephone Encounter (Signed)
Patient's wife Karen,designated contact, called back about physical therapy order to [facility] in [place]; states they don't have the order as of yet. Please advise.

## 2021-04-29 ENCOUNTER — Ambulatory Visit: Payer: Medicare PPO | Admitting: Neurology

## 2021-04-29 NOTE — Telephone Encounter (Signed)
faxed

## 2021-05-01 DIAGNOSIS — Z9889 Other specified postprocedural states: Secondary | ICD-10-CM | POA: Diagnosis not present

## 2021-05-01 DIAGNOSIS — M25511 Pain in right shoulder: Secondary | ICD-10-CM | POA: Diagnosis not present

## 2021-05-02 DIAGNOSIS — M25511 Pain in right shoulder: Secondary | ICD-10-CM | POA: Diagnosis not present

## 2021-05-02 DIAGNOSIS — Z9889 Other specified postprocedural states: Secondary | ICD-10-CM | POA: Diagnosis not present

## 2021-05-06 DIAGNOSIS — Z9889 Other specified postprocedural states: Secondary | ICD-10-CM | POA: Diagnosis not present

## 2021-05-06 DIAGNOSIS — M25511 Pain in right shoulder: Secondary | ICD-10-CM | POA: Diagnosis not present

## 2021-05-08 ENCOUNTER — Encounter: Payer: Self-pay | Admitting: Neurology

## 2021-05-08 ENCOUNTER — Ambulatory Visit: Payer: Medicare PPO | Admitting: Neurology

## 2021-05-08 VITALS — BP 137/81 | HR 59 | Ht 71.0 in | Wt 216.0 lb

## 2021-05-08 DIAGNOSIS — G4733 Obstructive sleep apnea (adult) (pediatric): Secondary | ICD-10-CM | POA: Diagnosis not present

## 2021-05-08 DIAGNOSIS — M25511 Pain in right shoulder: Secondary | ICD-10-CM | POA: Diagnosis not present

## 2021-05-08 DIAGNOSIS — Z9889 Other specified postprocedural states: Secondary | ICD-10-CM | POA: Insufficient documentation

## 2021-05-08 DIAGNOSIS — Z789 Other specified health status: Secondary | ICD-10-CM | POA: Diagnosis not present

## 2021-05-08 HISTORY — DX: Other specified postprocedural states: Z98.890

## 2021-05-08 HISTORY — DX: Other specified health status: Z78.9

## 2021-05-08 NOTE — Patient Instructions (Signed)
Screening for Sleep Apnea Sleep apnea is a condition in which breathing pauses or becomes shallow during sleep. Sleep apnea screening is a test to determine if you are at risk for sleep apnea. The test includes a series of questions. It will only takes a few minutes. Your health care provider may ask you to have this test in preparation for surgery or as part of a physical exam. What are the symptoms of sleep apnea? Common symptoms of sleep apnea include: Snoring. Waking up often at night. Daytime sleepiness. Pauses in breathing. Choking or gasping during sleep. Irritability. Forgetfulness. Trouble thinking clearly. Depression. Personality changes. Most people with sleep apnea do not know that they have it. What are the advantages of sleep apnea screening? Getting screened for sleep apnea can help: Ensure your safety. It is important for your health care providers to know whether or not you have sleep apnea, especially if you are having surgery or have other long-term (chronic) health conditions. Improve your health and allow you to get a better night's rest. Restful sleep can help you: Have more energy. Lose weight. Improve high blood pressure. Improve diabetes management. Prevent stroke. Prevent car accidents. What happens during the screening? Screening usually includes being asked a list of questions about your sleep quality. Some questions you may be asked include: Do you snore? Is your sleep restless? Do you have daytime sleepiness? Has a partner or spouse told you that you stop breathing during sleep? Have you had trouble concentrating or memory loss? What is your age? What is your neck circumference? To measure your neck, keep your back straight and gently wrap the tape measure around your neck. Put the tape measure at the middle of your neck, between your chin and collarbone. What is your sex assigned at birth? Do you have or are you being treated for high blood  pressure? If your screening test is positive, you are at risk for the condition. Further testing may be needed to confirm a diagnosis of sleep apnea. Where to find more information You can find screening tools online or at your health care clinic. For more information about sleep apnea screening and healthy sleep, visit these websites: Centers for Disease Control and Prevention: www.cdc.gov American Sleep Apnea Association: www.sleepapnea.org Contact a health care provider if: You think that you may have sleep apnea. Summary Sleep apnea screening can help determine if you are at risk for sleep apnea. It is important for your health care providers to know whether or not you have sleep apnea, especially if you are having surgery or have other chronic health conditions. You may be asked to take a screening test for sleep apnea in preparation for surgery or as part of a physical exam. This information is not intended to replace advice given to you by your health care provider. Make sure you discuss any questions you have with your health care provider. Document Revised: 10/19/2020 Document Reviewed: 10/19/2020 Elsevier Patient Education  2022 [employer]  

## 2021-05-08 NOTE — Progress Notes (Signed)
[name]    SLEEP MEDICINE CLINIC    Provider:  Larey Seat, MD  Primary Care Physician:  Glenda Chroman, MD [place] [address]     Referring Provider: Dr Jerrell Belfast, MD   Garfield Cornea, MD         Chief Complaint according to patient   Patient presents with:     New Patient (Initial Visit)     No show for 05-10-2019 visit with Dr. Rexene Alberts.       HISTORY OF PRESENT ILLNESS:  Paul Webb is a 65 y.o. year old  male patient seen here on 05/08/2021 , post HST and ENT evaluation, surgery to improve nasal airway.  The patient underwent a home sleep test this was in October 2020 and at that time he was diagnosed with severe sleep apnea his AHI was 46.8 but was not accentuated by REM sleep.  He did not have major oxygen desaturation.  He had more apnea in supine sleep position with an AHI of 51.  So I would like for this patient to have a repeat home sleep test now that is now that his nasal passage has been improved, he had undergone nasal septoplasty by lip bilateral inferior turbinate reduction and did a drug-induced sleep endoscopy under Dr. Victorio Palm guidance.  This was on February 27, 2021.  The procedure was done under propofol I would like to add that the surgery involved a restoration of nasal patency.   BMI is 30. 13. Kg/m2.   He will need a new HST            CONSULT note -from [place] his psychiatrist. He has had a telephone conversation only on 04-27-2019.  Chief concern according to patient : Hypersomnia, staying in bed all day, not sleeping through the night.    I have the pleasure of seeing Paul Webb today, a right handed Caucasian male with a possible sleep disorder. 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 Diagnosed at age 51.    Sleep relevant medical history:  GAD, anxiety- Nocturia - one , son uses CPAP.   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 and lives in a household with his spouse, he is married status , with 4 adult children,.  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  Pets:  A cat is present. Tobacco use never .   ETOH use none ,  Caffeine intake in form of Coffee( quit 5 month ago ) Soda( caffeine mountain dew ) Tea ( none) or energy drinks. Regular exercise; none .   Hobbies ; yard work.     Sleep habits are as follows: The patient's dinner time is between 6-7  PM.  The patient goes to bed at 12 midnight - asleep within 1 hour  , has racing thoughts- once asleep he continues to sleep for 3 hours, wakes for one bathroom break. The bedroom is cool, quiet and dark.  The preferred sleep position is laterally , right preferred. , with the support of 2 pillows.  Dreams are reportedly frequent/vivid.  The patient wakes up spontaneously  around noon.  10-12 AM is the usual rise time. No alarm set (!), sleep time 3-4 at night and 3-4 in daytime. He is bed for most of the day .  He reports not feeling refreshed or restored in AM, with symptoms such as dry mouth all the time , rarely morning headaches , and residual fatigue.  Naps are taken frequently, lasting from 30-60  minutes and are not more refreshing than nocturnal sleep.    Review of Systems: Out of a complete 14 system review, the patient complains of only the following symptoms, and all other reviewed systems are negative.:  Fatigue, sleepiness , snoring, fragmented sleep,   How likely are you to doze in the following situations: 0 = not likely, 1 = slight  chance, 2 = moderate chance, 3 = high chance   Sitting and Reading? Watching Television? Sitting inactive in a public place (theater or meeting)? As a passenger in a car for an hour without a break? Lying down in the afternoon when circumstances permit? Sitting and talking to someone? Sitting quietly after lunch without alcohol? In a car, while stopped for a few minutes in traffic?  He is not feeling better rested since ENT surgery, still snores very loudly.     Total = 6 from 10/ 24 points   FSS endorsed at  40 from 62/ 63 points.   fatigue, has responded-  no sleep attacks.  He goes out for PT,       Social History   Socioeconomic History   Marital status: Married    Spouse name: Santiago Glad   Number of children: Not on file   Years of education: 12   Highest education level: High school graduate  Occupational History   Occupation: Retired  Tobacco Use   Smoking status: Never   Smokeless tobacco: Never  Vaping Use   Vaping Use: Never used  Substance and Sexual Activity   Alcohol use: Never   Drug use: Never   Sexual activity: Not on file  Other Topics Concern   Not on file  Social History Narrative   Right Handed   Lives in one story with a basement    Drinks Caffeine    Social Determinants of Health   Financial Resource Strain: Not on file  Food Insecurity: Not on file  Transportation Needs: Not on file  Physical Activity: Not on file  Stress: Not on file  Social Connections: Not on file    Family History  Adopted: Yes  Family history unknown: Yes    Past Medical History:  Diagnosis Date   Arthritis    Circadian rhythm sleep disturbance 09/05/2019   Essential hypertension 07/30/2012   Fast heart beat    Gastro-esophageal reflux disease with esophagitis 07/30/2012   Generalized anxiety disorder 05/12/2017   GERD (gastroesophageal reflux disease)    History of kidney stones    Hyperlipidemia 07/30/2012   Major depressive disorder    OSA (obstructive  sleep apnea)    does not use CPAP   Type 2 diabetes mellitus 07/30/2012    Past Surgical History:  Procedure Laterality Date   COLONOSCOPY     COLONOSCOPY N/A 10/06/2018   Procedure: COLONOSCOPY;  Surgeon: Rogene Houston, MD;  [facility];  Service: Endoscopy;  Laterality: N/A;  1:25   DRUG INDUCED ENDOSCOPY N/A 02/27/2021   Procedure: DRUG INDUCED ENDOSCOPY;  Surgeon: Jerrell Belfast, MD;  Location: [facility];  Service: ENT;  Laterality: N/A;   FL  INJ RT KNEE CT ARTHROGRAM ([facility] HX)     NASAL SEPTOPLASTY W/ TURBINOPLASTY Bilateral 02/27/2021   Procedure: NASAL SEPTOPLASTY WITH TURBINATE REDUCTION;  Surgeon: Jerrell Belfast, MD;  Location: [facility];  Service: ENT;  Laterality: Bilateral;   POLYPECTOMY  10/06/2018   Procedure: POLYPECTOMY;  Surgeon: Rogene Houston, MD;  [facility];  Service: Endoscopy;;  colon    Rod in lower leg     trauma   Rt hernia repair     SHOULDER ARTHROSCOPY Right 04/16/2021   Procedure: RIGHT SHOULDER ARTHOROSCOPY AND DEBRIDEMENT;  Surgeon: Carole Civil, MD;  [facility];  Service: Orthopedics;  Laterality: Right;     Current Outpatient Medications on File Prior to Visit  Medication Sig Dispense Refill   aspirin EC 81 MG tablet Take 1 tablet (81 mg total) by mouth daily.     atenolol (TENORMIN) 50 MG tablet Take 50 mg by mouth at bedtime.      BREO ELLIPTA 100-25 MCG/INH AEPB Inhale 1 puff into the lungs daily.     buPROPion (WELLBUTRIN XL) 300 MG 24 hr tablet Take 1 tablet (300 mg total) by mouth daily. 30 tablet 2   cetirizine (ZYRTEC) 10 MG tablet Take 10 mg by mouth at bedtime.      Cholecalciferol (VITAMIN D) 2000 units tablet Take 2,000 Units by mouth daily.     HYDROcodone-acetaminophen (NORCO/VICODIN) 5-325 MG tablet Take 1 tablet by mouth every 6 (six) hours as needed for moderate pain. 30 tablet 0   hydrOXYzine (ATARAX/VISTARIL) 25 MG tablet Take 1 tablet (25 mg total) by mouth  daily as needed for anxiety. 90 tablet 0   lisinopril-hydrochlorothiazide (PRINZIDE,ZESTORETIC) 20-25 MG tablet Take 1 tablet by mouth daily.     meloxicam (MOBIC) 7.5 MG tablet Take 7.5 mg by mouth daily.     Multiple Vitamin (MULTIVITAMIN) tablet Take 1 tablet by mouth daily.     NOVOLOG 100 UNIT/ML injection Inject into the skin continuous. Via Insulin pump     omeprazole (PRILOSEC) 40 MG capsule Take 40 mg by mouth daily.     pravastatin (PRAVACHOL) 80 MG tablet Take 80 mg by mouth at bedtime.      promethazine (PHENERGAN) 25 MG tablet TAKE 1/2 TABLET BY MOUTH EVERY 6 HOURS AS NEEDED FOR NAUSEA AND VOMITING 15 tablet 0   sertraline (ZOLOFT) 100 MG tablet Take 1.5 tablets (150 mg total) by mouth daily. 45 tablet 0   traZODone (DESYREL) 100 MG tablet Take 1 tablet (100 mg total) by mouth at bedtime as needed for sleep. (Patient taking differently: Take 100 mg by mouth at bedtime.) 30 tablet 2   No current facility-administered medications on file prior to visit.    Physical exam:  Today's Vitals   05/08/21 0929  BP: 137/81  Pulse: (!) 59  Weight: 216 lb (98 kg)  Height: 5\' 11"  (1.803 m)   Body mass index is 30.13 kg/m.   Wt Readings from Last 3 Encounters:  05/08/21 216 lb (98 kg)  04/24/21 215 lb (97.5 kg)  04/16/21 212 lb 1.3 oz (96.2 kg)     Ht Readings from Last 3 Encounters:  05/08/21 5\' 11"  (1.803 m)  04/24/21 5\' 11"  (1.803 m)  04/16/21 5\' 11"  (1.803 m)      General: The patient is awake, alert and appears not in acute distress. The patient is well groomed. Head: Normocephalic, atraumatic. Neck is supple. Mallampati 3- a lot of soft tissue.  neck circumference:*16.5  inches . Nasal airflow patent.  Retrognathia is seen.  Dental status: crowded.  Cardiovascular:  Regular rate and cardiac rhythm by pulse,  without distended neck veins. Respiratory: Lungs are clear to auscultation.  Skin:  Without evidence of ankle edema, or rash. Trunk: The patient's posture is  erect.   Neurologic exam : The patient is awake and alert, oriented to place and time.   Memory subjective described as intact.  Attention span & concentration ability appears normal.  Speech is fluent,  without  dysarthria, dysphonia or aphasia.  Mood and affect are appropriate.   Cranial nerves: no loss of smell or taste reported  Pupils are equal and briskly reactive to light. Funduscopic exam deferred. .  Extraocular movements in vertical and horizontal planes were intact and without nystagmus. No Diplopia. Visual fields by finger perimetry are intact. Hearing was intact to soft voice and finger rubbing.   Facial sensation intact to fine touch. Facial motor strength is symmetric and tongue and uvula move midline. Neck ROM : rotation, tilt and flexion extension were normal for age and shoulder shrug was symmetrical.    Motor exam:  Symmetric bulk, tone and ROM.   Normal tone without cog wheeling, symmetric grip strength .   Sensory:  Fine touch, pinprick and vibration were normal in the upper extremities. .  Proprioception tested in the upper extremities was normal. Coordination: Rapid alternating movements in the fingers/hands were of normal speed.  The Finger-to-nose maneuver was intact without evidence of ataxia, dysmetria or tremor.   Gait and station: Patient could rise unassisted from a seated position, walked without assistive device.  Toe and heel walk were deferred.  Deep tendon reflexes: in the  upper and lower extremities are symmetric and intact.  Babinski response was deferred .       After spending a total time of 21  minutes face to face and additional time for physical and neurologic examination, review of laboratory studies,  personal review of imaging studies, reports and results of other testing and review of referral information / records as far as provided in visit, I have established the following assessments:  1) confirmed severe OSA in 2020- now had nasal  septal and a turbinate reduction with Dr Wilburn Cornelia but still snores.  He is interested in [facility] procedure.  2) shoulder pan, sometimes wakes up. 3) no nocturia.     My Plan is to proceed with:  1) HST to check for INSPIRE comperability   I would like to thank Dr Wilburn Cornelia and Dr. Modesta Messing [address] [place],  [place] [address] for allowing me to meet with and to take care of this pleasant patient.   In short, Paul Webb is presenting with untreated OSA , interested in [facility] - still some hypersomnia . I plan to follow up either personally or through our NP within 2-3  month.   CC: I will share my notes with  PCP   Electronically signed by: Larey Seat, MD 05/08/2021 10:01 AM  [facility] Board certified by The [employer] of Sleep Medicine and Diplomate of the [employer] of Sleep Medicine. Board certified In Neurology through the [place], Fellow of the [employer] of Neurology. Medical Director of [employer].

## 2021-05-10 DIAGNOSIS — E1065 Type 1 diabetes mellitus with hyperglycemia: Secondary | ICD-10-CM | POA: Diagnosis not present

## 2021-05-10 DIAGNOSIS — E559 Vitamin D deficiency, unspecified: Secondary | ICD-10-CM | POA: Diagnosis not present

## 2021-05-10 DIAGNOSIS — E291 Testicular hypofunction: Secondary | ICD-10-CM | POA: Diagnosis not present

## 2021-05-10 DIAGNOSIS — M25511 Pain in right shoulder: Secondary | ICD-10-CM | POA: Diagnosis not present

## 2021-05-10 DIAGNOSIS — E785 Hyperlipidemia, unspecified: Secondary | ICD-10-CM | POA: Diagnosis not present

## 2021-05-10 DIAGNOSIS — Z9889 Other specified postprocedural states: Secondary | ICD-10-CM | POA: Diagnosis not present

## 2021-05-10 NOTE — Progress Notes (Signed)
Virtual Visit via Telephone Note  I connected with Paul Webb on 05/14/21 at  8:40 AM EDT by telephone and verified that I am speaking with the correct person using two identifiers.  Location: Patient: home Provider: office Persons participated in the visit- patient, provider    I discussed the limitations, risks, security and privacy concerns of performing an evaluation and management service by telephone and the availability of in person appointments. I also discussed with the patient that there may be a patient responsible charge related to this service. The patient expressed understanding and agreed to proceed.   I discussed the assessment and treatment plan with the patient. The patient was provided an opportunity to ask questions and all were answered. The patient agreed with the plan and demonstrated an understanding of the instructions.   The patient was advised to call back or seek an in-person evaluation if the symptoms worsen or if the condition fails to improve as anticipated.  I provided 12 minutes of non-face-to-face time during this encounter.   Paul Clay, MD    [facility] MD/PA/NP OP Progress Note  05/14/2021 9:02 AM Paul Webb  MRN:  [id number]  Chief Complaint:  Chief Complaint   Follow-up; Depression    HPI:  This is a follow-up appointment for depression and insomnia.  He states that he had shoulder surgery.  He is planning to see a PT.  Although he is unable to lift his arm, he believes it will go well.  He also had surgery for deviated septum; he is able to breathe better, and sleep better.  He enjoys going out for eating.  He enjoys having more time with his wife, who was retired.  He has not noticed any change since discontinuation of Abilify.  He denies feeling depressed.  He eats healthier diet, and feels good about his 15 pounds weight loss over the past 4 months.  He denies SI.  He denies anxiety.  He has not taken any hydroxyzine since the last visit.   He feels comfortable to stay on his medication.   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 MDD (major depressive disorder), recurrent, in full remission ([place])  F33.42     2. Insomnia, unspecified type  G47.00       Past Psychiatric History: Please see initial evaluation for full details. I have reviewed the history. No updates at this time.     Past Medical History:  Past Medical History:  Diagnosis Date   Arthritis    Circadian rhythm sleep disturbance 09/05/2019   Essential hypertension 07/30/2012   Fast heart beat    Gastro-esophageal reflux disease with esophagitis 07/30/2012   Generalized anxiety disorder 05/12/2017   GERD (gastroesophageal reflux disease)    History of kidney stones    Hyperlipidemia 07/30/2012   Major depressive disorder    OSA (obstructive sleep apnea)    does not use CPAP   Type 2 diabetes mellitus 07/30/2012    Past Surgical History:  Procedure Laterality Date   COLONOSCOPY     COLONOSCOPY N/A 10/06/2018   Procedure: COLONOSCOPY;  Surgeon: Rogene Houston, MD;  [facility];  Service: Endoscopy;  Laterality: N/A;  1:25   DRUG INDUCED ENDOSCOPY N/A 02/27/2021   Procedure: DRUG INDUCED ENDOSCOPY;  Surgeon: Jerrell Belfast, MD;  Location: [facility];  Service: ENT;  Laterality: N/A;   FL INJ RT KNEE CT ARTHROGRAM ([facility] HX)     NASAL SEPTOPLASTY W/ TURBINOPLASTY Bilateral 02/27/2021   Procedure: NASAL SEPTOPLASTY WITH TURBINATE REDUCTION;  Surgeon: Jerrell Belfast, MD;  Location: [facility];  Service: ENT;  Laterality: Bilateral;   POLYPECTOMY  10/06/2018   Procedure: POLYPECTOMY;  Surgeon: Rogene Houston, MD;  [facility];  Service: Endoscopy;;  colon    Rod in lower leg     trauma   Rt hernia repair     SHOULDER ARTHROSCOPY Right 04/16/2021   Procedure: RIGHT SHOULDER ARTHOROSCOPY AND DEBRIDEMENT;  Surgeon: Carole Civil, MD;  [facility];  Service: Orthopedics;  Laterality: Right;    Family Psychiatric History: Please see initial evaluation for full details. I have reviewed the history. No updates at this time.     Family History:  Family History  Adopted: Yes  Family history unknown: Yes    Social History:  Social History   Socioeconomic History   Marital status: Married    Spouse name: Santiago Glad   Number of children: Not on file   Years of education: 12   Highest education level: High school graduate  Occupational History   Occupation: Retired  Tobacco Use   Smoking status: Never   Smokeless tobacco: Never  Scientific laboratory technician Use: Never used  Substance and Sexual Activity   Alcohol use: Never   Drug use: Never   Sexual activity: Not on file  Other Topics Concern   Not on file  Social History Narrative   Right Handed   Lives in one story with a basement    Drinks Caffeine    Social Determinants of Health   Financial Resource Strain: Not on file  Food Insecurity: Not on file  Transportation Needs: Not on file  Physical Activity: Not on file  Stress: Not on file  Social Connections: Not on file    Allergies: No Known Allergies  Metabolic Disorder Labs: Lab Results  Component Value Date   HGBA1C 6.4 (H) 04/15/2021   MPG 136.98 04/15/2021   No results found for: PROLACTIN No results found for: CHOL, TRIG, HDL, CHOLHDL, VLDL, LDLCALC No results found for: TSH  Therapeutic Level Labs: No results found for: LITHIUM No results found for: VALPROATE No components found for:  CBMZ  Current  Medications: Current Outpatient Medications  Medication Sig Dispense Refill   aspirin EC 81 MG tablet Take 1 tablet (81 mg total) by mouth daily.     atenolol (TENORMIN) 50 MG tablet Take 50 mg by mouth at bedtime.      BREO ELLIPTA 100-25 MCG/INH AEPB Inhale 1 puff into the lungs daily.     buPROPion (WELLBUTRIN XL) 300 MG 24 hr tablet Take 1 tablet (300 mg total) by mouth daily. 30 tablet 2   cetirizine (ZYRTEC) 10 MG tablet Take 10 mg by mouth at bedtime.      Cholecalciferol (VITAMIN D) 2000 units tablet Take 2,000 Units by mouth daily.     HYDROcodone-acetaminophen (NORCO/VICODIN) 5-325 MG tablet Take 1 tablet by mouth every 6 (six)  hours as needed for moderate pain. (Patient not taking: Reported on 05/14/2021) 30 tablet 0   hydrOXYzine (ATARAX/VISTARIL) 25 MG tablet Take 1 tablet (25 mg total) by mouth daily as needed for anxiety. 90 tablet 0   lisinopril-hydrochlorothiazide (PRINZIDE,ZESTORETIC) 20-25 MG tablet Take 1 tablet by mouth daily.     meloxicam (MOBIC) 7.5 MG tablet Take 7.5 mg by mouth daily.     Multiple Vitamin (MULTIVITAMIN) tablet Take 1 tablet by mouth daily.     NOVOLOG 100 UNIT/ML injection Inject into the skin continuous. Via Insulin pump     omeprazole (PRILOSEC) 40 MG capsule Take 40 mg by mouth daily.     pravastatin (PRAVACHOL) 80 MG tablet Take 80 mg by mouth at bedtime.      promethazine (PHENERGAN) 25 MG tablet TAKE 1/2 TABLET BY MOUTH EVERY 6 HOURS AS NEEDED FOR NAUSEA AND VOMITING 15 tablet 0   sertraline (ZOLOFT) 100 MG tablet Take 1.5 tablets (150 mg total) by mouth daily. 45 tablet 2   traZODone (DESYREL) 100 MG tablet Take 1 tablet (100 mg total) by mouth at bedtime as needed for sleep. 30 tablet 2   No current facility-administered medications for this visit.     Musculoskeletal: Strength & Muscle Tone:  N/A Gait & Station:  N/A Patient leans: N/A  Psychiatric Specialty Exam: Review of Systems  Psychiatric/Behavioral:  Negative for agitation,  behavioral problems, confusion, decreased concentration, dysphoric mood, hallucinations, self-injury, sleep disturbance and suicidal ideas. The patient is not nervous/anxious and is not hyperactive.   All other systems reviewed and are negative.  There were no vitals taken for this visit.There is no height or weight on file to calculate BMI.  General Appearance: NA  Eye Contact:  NA  Speech:  Clear and Coherent  Volume:  Normal  Mood:   good  Affect:  NA  Thought Process:  Coherent  Orientation:  Full (Time, Place, and Person)  Thought Content: Logical   Suicidal Thoughts:  No  Homicidal Thoughts:  No  Memory:  Immediate;   Good  Judgement:  Good  Insight:  Fair  Psychomotor Activity:  Normal  Concentration:  Concentration: Good and Attention Span: Good  Recall:  Good  Fund of Knowledge: Good  Language: Good  Akathisia:  No  Handed:  Right  AIMS (if indicated): not done  Assets:  Communication Skills Desire for Improvement  ADL's:  Intact  Cognition: WNL  Sleep:  Good   Screenings: PHQ2-9    Flowsheet Row Video Visit from 05/14/2021 in [facility] Video Visit from 02/11/2021 in [facility]  PHQ-2 Total Score 0 1      Flowsheet Row Admission (Discharged) from 04/16/2021 in [facility] from 04/15/2021 in [facility] Admission (Discharged) from 02/27/2021 in [facility] No Risk Error: Question 6 not populated No Risk        Assessment and Plan:  VAUGHAN GARFINKLE is a 65 y.o. year old male with a history of depression, anxiety,  diabetes, hyperlipidemia, sleep apnea, who presents for follow up appointment for below.   1. MDD (major depressive disorder), recurrent, in full remission ([place]) He denies any significant mood symptoms, and has no change since discontinuation of Abilify.  Recent psychosocial stressors includes s/p shoulder surgery.   Other psychosocial stressors includes demoralization due to diabetes and employment.  Will continue sertraline and bupropion to target depression.   2. Insomnia, unspecified type He  reports improvement in insomnia after having surgery for deviated septum.  He will have another sleep study.  Will continue trazodone as needed for insomnia.      Plan I have reviewed and updated plans as below  1. Continue sertraline 150 mg daily  2. Continue bupropion 300 mg daily (limited benefit from uptitration of 150 to 300 mg ) 3. Continue Trazodone 100 mg at night 4. Continue hydroxyzine 25 mg daily as needed for anxiety- he declined refill 5. Next appointment:  9/14 at 8:40 for 20 mins, phone - TSH checked according to his wife a few weeks ago; wnl   Past trials of medication: ?lexapro, venlafaxine, Abilify   The patient demonstrates the following risk factors for suicide: Chronic risk factors for suicide include: psychiatric disorder of depression. Acute risk factors for suicide include: family or marital conflict and loss (financial, interpersonal, professional). Protective factors for this patient include: coping skills and hope for the future. Considering these factors, the overall suicide risk at this point appears to be low. Patient is appropriate for outpatient follow up.        Paul Clay, MD 05/14/2021, 9:02 AM

## 2021-05-13 DIAGNOSIS — M25511 Pain in right shoulder: Secondary | ICD-10-CM | POA: Diagnosis not present

## 2021-05-13 DIAGNOSIS — Z9889 Other specified postprocedural states: Secondary | ICD-10-CM | POA: Diagnosis not present

## 2021-05-14 ENCOUNTER — Encounter: Payer: Self-pay | Admitting: Psychiatry

## 2021-05-14 ENCOUNTER — Telehealth (INDEPENDENT_AMBULATORY_CARE_PROVIDER_SITE_OTHER): Payer: Medicare PPO | Admitting: Psychiatry

## 2021-05-14 ENCOUNTER — Other Ambulatory Visit: Payer: Self-pay

## 2021-05-14 DIAGNOSIS — G4700 Insomnia, unspecified: Secondary | ICD-10-CM

## 2021-05-14 DIAGNOSIS — F3342 Major depressive disorder, recurrent, in full remission: Secondary | ICD-10-CM | POA: Diagnosis not present

## 2021-05-14 MED ORDER — TRAZODONE HCL 100 MG PO TABS: 100.0000 mg | ORAL_TABLET | Freq: Every evening | ORAL | 2 refills | Status: DC | PRN

## 2021-05-14 MED ORDER — BUPROPION HCL ER (XL) 300 MG PO TB24: 300.0000 mg | ORAL_TABLET | Freq: Every day | ORAL | 2 refills | Status: DC

## 2021-05-14 MED ORDER — SERTRALINE HCL 100 MG PO TABS: 150.0000 mg | ORAL_TABLET | Freq: Every day | ORAL | 2 refills | Status: DC

## 2021-05-15 ENCOUNTER — Ambulatory Visit (INDEPENDENT_AMBULATORY_CARE_PROVIDER_SITE_OTHER): Payer: Medicare PPO | Admitting: Neurology

## 2021-05-15 DIAGNOSIS — G4733 Obstructive sleep apnea (adult) (pediatric): Secondary | ICD-10-CM | POA: Diagnosis not present

## 2021-05-15 DIAGNOSIS — M25511 Pain in right shoulder: Secondary | ICD-10-CM | POA: Diagnosis not present

## 2021-05-15 DIAGNOSIS — Z9889 Other specified postprocedural states: Secondary | ICD-10-CM

## 2021-05-16 NOTE — Progress Notes (Signed)
   [facility] (Watch PAT) REPORT  STUDY DATE:05-15-21  DOB: October 10, 1956  MRN: [id number]  ORDERING CLINICIAN: Larey Seat, MD   REFERRING CLINICIAN: Dr. Modesta Messing and Dr. Jerrell Belfast.  CLINICAL INFORMATION/HISTORY: Paul Webb today, a right handed Caucasian male with a possible sleep disorder. 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   He underwent nasal septal surgery.   Epworth sleepiness score: 10/24.  BMI: 31.5 kg/m  Neck Circumference: 16.5"  FINDINGS:   Sleep Summary:   Total Recording Time (hours, min): 7 h 59 min  Total Sleep Time (hours, min):  7 h 18 min   Percent REM (%):    9.01%   Respiratory Indices:   Calculated pAHI (per hour):  67.6/hour        REM pAHI:    77.6/hour      NREM pAHI: 66.6/hour Supine AHI: 38.7/hour  Oxygen Saturation Statistics:    Oxygen Saturation (%) Mean: 92%  Minimum oxygen saturation (%):      70%  O2 Saturation Range (%): 70-98%   O2 Saturation (minutes) <=88%: 42.7 min  Pulse Rate Statistics:   Pulse Mean (bpm):   56/min    Pulse Range (35-101/min)   IMPRESSION: This HST documented severe OSA (obstructive sleep apnea) with an AHI of 67.7/h not dependent on sleep-position, REM sleep. Loud snoring was present.  Heart rate was variable . There was also prolonged hypoxia present for 43 minutes, 02 nadir was 70%  RECOMMENDATION:  This constellation of severe sleep apnea and hypoxia warrants PAP therapy only- CPAP should be easier to tolerate after nasal airway patency has been established.  The patient should start on CPAP  with a pressure between 6-20 cm water, 2 cm EPR , heated humidification and his mask of choice. If CPAP is not tolerated,I would consider BiPAP instead. DME :Please help this patient choosing a mask that doesn't increase anxiety.    INTERPRETING PHYSICIAN:  Larey Seat, MD    [facility] and [facility] Sleep Board certified by The [employer] of Sleep Medicine and Diplomate of the [employer] of Sleep Medicine. Board certified In Neurology through the [place], Fellow of the [employer] of Neurology. Medical Director of [employer].

## 2021-05-17 DIAGNOSIS — M25511 Pain in right shoulder: Secondary | ICD-10-CM | POA: Diagnosis not present

## 2021-05-17 DIAGNOSIS — Z9889 Other specified postprocedural states: Secondary | ICD-10-CM | POA: Diagnosis not present

## 2021-05-20 DIAGNOSIS — M25511 Pain in right shoulder: Secondary | ICD-10-CM | POA: Diagnosis not present

## 2021-05-20 DIAGNOSIS — Z9889 Other specified postprocedural states: Secondary | ICD-10-CM | POA: Diagnosis not present

## 2021-05-20 DIAGNOSIS — E1065 Type 1 diabetes mellitus with hyperglycemia: Secondary | ICD-10-CM | POA: Diagnosis not present

## 2021-05-21 DIAGNOSIS — E1065 Type 1 diabetes mellitus with hyperglycemia: Secondary | ICD-10-CM | POA: Diagnosis not present

## 2021-05-22 ENCOUNTER — Encounter: Payer: Self-pay | Admitting: Orthopedic Surgery

## 2021-05-22 ENCOUNTER — Ambulatory Visit (INDEPENDENT_AMBULATORY_CARE_PROVIDER_SITE_OTHER): Payer: Medicare PPO | Admitting: Orthopedic Surgery

## 2021-05-22 ENCOUNTER — Other Ambulatory Visit: Payer: Self-pay

## 2021-05-22 DIAGNOSIS — D485 Neoplasm of uncertain behavior of skin: Secondary | ICD-10-CM | POA: Insufficient documentation

## 2021-05-22 DIAGNOSIS — L814 Other melanin hyperpigmentation: Secondary | ICD-10-CM | POA: Insufficient documentation

## 2021-05-22 DIAGNOSIS — Z9889 Other specified postprocedural states: Secondary | ICD-10-CM

## 2021-05-22 DIAGNOSIS — D239 Other benign neoplasm of skin, unspecified: Secondary | ICD-10-CM

## 2021-05-22 DIAGNOSIS — D367 Benign neoplasm of other specified sites: Secondary | ICD-10-CM | POA: Insufficient documentation

## 2021-05-22 DIAGNOSIS — L738 Other specified follicular disorders: Secondary | ICD-10-CM

## 2021-05-22 DIAGNOSIS — L570 Actinic keratosis: Secondary | ICD-10-CM | POA: Insufficient documentation

## 2021-05-22 DIAGNOSIS — D2362 Other benign neoplasm of skin of left upper limb, including shoulder: Secondary | ICD-10-CM

## 2021-05-22 HISTORY — DX: Other benign neoplasm of skin, unspecified: D23.9

## 2021-05-22 HISTORY — DX: Benign neoplasm of other specified sites: D36.7

## 2021-05-22 HISTORY — DX: Other melanin hyperpigmentation: L81.4

## 2021-05-22 HISTORY — DX: Actinic keratosis: L57.0

## 2021-05-22 HISTORY — DX: Other benign neoplasm of skin of left upper limb, including shoulder: D23.62

## 2021-05-22 HISTORY — DX: Other specified follicular disorders: L73.8

## 2021-05-22 NOTE — Patient Instructions (Signed)
Continue physical therapy and continue lifting restrictions

## 2021-05-22 NOTE — Progress Notes (Signed)
POST OP   Encounter Diagnosis  Name Primary?   S/P arthroscopy of right shoulder 04/17/21 Yes   POST OP VISIT: # 2  PROCEDURE : Arthroscopy right shoulder  Paul Webb is continue to do well his pain levels are 0-3 out of 10 he is going to physical therapy they say he is about 80% improved  His exam bears that out  His PT note is in the media section as I took a photo of it today and I would like to see him in 6 weeks  He should continue his lifting restrictions  PROCEDURE:  Procedure(s): RIGHT SHOULDER ARTHOROSCOPY AND LIMITED DEBRIDEMENT (Right) -[id number] Debridement included 1 degenerative labrum 2 subacromial bursa     Operative findings:    torn superior labrum anterior and posterior degenerative TEAR;  intact biceps anchor, curtain or drape variant biceps attachment   Normal glenohumeral joint.  Normal subscapularis attachment.     Bursitis subacromial space, partial-thickness anterior bursal sided rotator cuff tear 2 to 3 mm depth.   Normal biceps tendon including normal bicipital groove   Normal range of motion preop

## 2021-05-24 DIAGNOSIS — M25511 Pain in right shoulder: Secondary | ICD-10-CM | POA: Diagnosis not present

## 2021-05-24 DIAGNOSIS — Z9889 Other specified postprocedural states: Secondary | ICD-10-CM | POA: Diagnosis not present

## 2021-05-29 DIAGNOSIS — M25511 Pain in right shoulder: Secondary | ICD-10-CM | POA: Diagnosis not present

## 2021-05-29 DIAGNOSIS — Z9889 Other specified postprocedural states: Secondary | ICD-10-CM | POA: Diagnosis not present

## 2021-05-30 DIAGNOSIS — M25511 Pain in right shoulder: Secondary | ICD-10-CM | POA: Diagnosis not present

## 2021-05-30 DIAGNOSIS — Z9889 Other specified postprocedural states: Secondary | ICD-10-CM | POA: Diagnosis not present

## 2021-06-02 NOTE — Progress Notes (Signed)
This apnea constellation is not treatable by INSPIRE or dental device as it is associated with prolonged hypoxia and very severe.  IMPRESSION: This HST documented severe OSA (obstructive sleep apnea) with an AHI of 67.7/h not dependent on sleep-position, REM sleep. Loud snoring was present.  Heart rate was variable . There was also prolonged hypoxia present for 43 minutes, 02 nadir was 70%  RECOMMENDATION: This constellation of severe sleep apnea and hypoxia warrants PAP therapy only- CPAP should be easier to tolerate after nasal airway patency has been established. The patient should start on CPAP with a pressure between 6-20 cm water, 2 cm EPR , heated humidification and his mask of choice. If CPAP is not tolerated,I would consider BiPAP instead. DME :Please help this patient choosing a mask that doesn't increase anxiety.

## 2021-06-02 NOTE — Addendum Note (Signed)
Addended by: Larey Seat on: 06/02/2021 04:47 PM   Modules accepted: Orders

## 2021-06-02 NOTE — Procedures (Signed)
[facility] (Watch PAT) REPORT  STUDY DATE:05-15-21  DOB: Mar 30, 1956  MRN: [id number]  ORDERING CLINICIAN: Larey Seat, MD   REFERRING CLINICIAN: Dr. Modesta Messing and Dr. Jerrell Belfast.  CLINICAL INFORMATION/HISTORY: Paul Webb today, a right handed Caucasian male with a possible sleep disorder. 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   He underwent nasal septal surgery.   Epworth sleepiness score: 10/24.  BMI: 31.5 kg/m  Neck Circumference: 16.5"  FINDINGS:   Sleep Summary:   Total Recording Time (hours, min): 7 h 59 min  Total Sleep Time (hours, min):  7 h 18 min   Percent REM (%):    9.01%   Respiratory Indices:   Calculated pAHI (per hour):  67.6/hour        REM pAHI:    77.6/hour      NREM pAHI: 66.6/hour Supine AHI: 38.7/hour  Oxygen Saturation Statistics:    Oxygen Saturation (%) Mean: 92%  Minimum oxygen saturation (%):      70%  O2 Saturation Range (%): 70-98%   O2 Saturation (minutes) <=88%: 42.7 min  Pulse Rate Statistics:   Pulse Mean (bpm):   56/min    Pulse Range (35-101/min)   IMPRESSION: This HST documented severe OSA (obstructive sleep apnea) with an AHI of 67.7/h not dependent on sleep-position, REM sleep. Loud snoring was present.  Heart rate was variable . There was also prolonged hypoxia present for 43 minutes, 02 nadir was 70%  RECOMMENDATION:  This constellation of severe sleep apnea and hypoxia warrants PAP therapy only- CPAP should be easier to tolerate after nasal airway patency has been established.  The patient should start on CPAP with a  pressure between 6-20 cm water, 2 cm EPR , heated humidification and his mask of choice. If CPAP is not tolerated,I would consider BiPAP instead. DME :Please help this patient choosing a mask that doesn't increase anxiety.    INTERPRETING PHYSICIAN:  Larey Seat, MD    [facility] and [facility] Sleep Board certified by The [employer] of Sleep Medicine and Diplomate of the [employer] of Sleep Medicine. Board certified In Neurology through the [place], Fellow of the [employer] of Neurology. Medical Director of [employer].

## 2021-06-03 ENCOUNTER — Telehealth: Payer: Self-pay | Admitting: *Deleted

## 2021-06-03 DIAGNOSIS — M25511 Pain in right shoulder: Secondary | ICD-10-CM | POA: Diagnosis not present

## 2021-06-03 DIAGNOSIS — Z9889 Other specified postprocedural states: Secondary | ICD-10-CM | POA: Diagnosis not present

## 2021-06-03 NOTE — Telephone Encounter (Signed)
Pt's wife lvm in the sleep lab inquiring about the pt's home sleep study result. Please advise.

## 2021-06-03 NOTE — Telephone Encounter (Signed)
-----   Message from Larey Seat, MD sent at 06/02/2021  4:47 PM EDT ----- This apnea constellation is not treatable by INSPIRE or dental device as it is associated with prolonged hypoxia and very severe.  IMPRESSION: This HST documented severe OSA (obstructive sleep apnea) with an AHI of 67.7/h not dependent on sleep-position, REM sleep. Loud snoring was present.  Heart rate was variable . There was also prolonged hypoxia present for 43 minutes, 02 nadir was 70%  RECOMMENDATION: This constellation of severe sleep apnea and hypoxia warrants PAP therapy only- CPAP should be easier to tolerate after nasal airway patency has been established. The patient should start on CPAP with a pressure between 6-20 cm water, 2 cm EPR , heated humidification and his mask of choice. If CPAP is not tolerated,I would consider BiPAP instead. DME :Please help this patient choosing a mask that doesn't increase anxiety.

## 2021-06-03 NOTE — Telephone Encounter (Signed)
LVM for pt to call about results. °

## 2021-06-04 ENCOUNTER — Encounter: Payer: Self-pay | Admitting: Neurology

## 2021-06-04 NOTE — Telephone Encounter (Signed)
LVM returning wife's call.

## 2021-06-05 NOTE — Telephone Encounter (Signed)
Pt and his wife returned the call and I was able to review the results with them both. Advised that due to the severity of the sleep apnea along with the low oxygen concerns, Dr. Brett Fairy recommends CPAP as the best treatment option. Pt was looking into Inspire as a option and Dr Brett Fairy doesn't feel based off this study that he would be a candidate for the patient.  I will mail a copy of the sleep study to the pt and send the result also to Dr Wilburn Cornelia.  They were appreciative for the information.

## 2021-06-06 DIAGNOSIS — M25511 Pain in right shoulder: Secondary | ICD-10-CM | POA: Diagnosis not present

## 2021-06-06 DIAGNOSIS — Z9889 Other specified postprocedural states: Secondary | ICD-10-CM | POA: Diagnosis not present

## 2021-06-10 DIAGNOSIS — Z9889 Other specified postprocedural states: Secondary | ICD-10-CM | POA: Diagnosis not present

## 2021-06-10 DIAGNOSIS — M25511 Pain in right shoulder: Secondary | ICD-10-CM | POA: Diagnosis not present

## 2021-06-12 DIAGNOSIS — M25511 Pain in right shoulder: Secondary | ICD-10-CM | POA: Diagnosis not present

## 2021-06-12 DIAGNOSIS — Z9889 Other specified postprocedural states: Secondary | ICD-10-CM | POA: Diagnosis not present

## 2021-06-17 DIAGNOSIS — G4733 Obstructive sleep apnea (adult) (pediatric): Secondary | ICD-10-CM | POA: Diagnosis not present

## 2021-06-19 ENCOUNTER — Telehealth: Payer: Self-pay | Admitting: Neurology

## 2021-06-19 DIAGNOSIS — Z9889 Other specified postprocedural states: Secondary | ICD-10-CM | POA: Diagnosis not present

## 2021-06-19 DIAGNOSIS — E1065 Type 1 diabetes mellitus with hyperglycemia: Secondary | ICD-10-CM | POA: Diagnosis not present

## 2021-06-19 DIAGNOSIS — M25511 Pain in right shoulder: Secondary | ICD-10-CM | POA: Diagnosis not present

## 2021-06-19 NOTE — Telephone Encounter (Signed)
Pt's wife has called back to inform Dr Brett Fairy that pt would like to try the CPAP again, please call

## 2021-06-19 NOTE — Telephone Encounter (Signed)
Called the wife back. There was no answer. Left a detailed message advising that I received the message and reviewed DR Shoemaker's note about trying CPAP again. Advised on VM order will go to DME company, [employer] [facility]). Advised a letter will go to the pt on mychart as well reviewing the compliance guidelines and needing to call to schedule a initial CPAP f/u.  Instructed them to call back with questions.

## 2021-06-20 DIAGNOSIS — E1065 Type 1 diabetes mellitus with hyperglycemia: Secondary | ICD-10-CM | POA: Diagnosis not present

## 2021-06-21 DIAGNOSIS — M25511 Pain in right shoulder: Secondary | ICD-10-CM | POA: Diagnosis not present

## 2021-06-21 DIAGNOSIS — Z9889 Other specified postprocedural states: Secondary | ICD-10-CM | POA: Diagnosis not present

## 2021-07-03 ENCOUNTER — Ambulatory Visit (INDEPENDENT_AMBULATORY_CARE_PROVIDER_SITE_OTHER): Payer: Medicare PPO | Admitting: Orthopedic Surgery

## 2021-07-03 ENCOUNTER — Other Ambulatory Visit: Payer: Self-pay

## 2021-07-03 DIAGNOSIS — Z9889 Other specified postprocedural states: Secondary | ICD-10-CM

## 2021-07-03 NOTE — Progress Notes (Signed)
Post op   Encounter Diagnosis  Name Primary?   S/P arthroscopy of right shoulder 04/16/21 Yes    Paul Webb continues to improve he was discharged from [facility] physical therapy with 85% return to strength and flexibility range of motion and he says he is doing well  He has 150 degrees of passive flexion 30 degrees of external rotation with the arm at the side  This produces no pain  Recommend continue exercises daily  Follow-up in 2 months   PRE-OPERATIVE DIAGNOSIS:  Right shoulder labral tear and biceps tendonitis   POST-OPERATIVE DIAGNOSIS:  Right shoulder labral tear and biceps tendonitis   PROCEDURE:  Procedure(s): RIGHT SHOULDER ARTHOROSCOPY AND LIMITED DEBRIDEMENT (Right) [id number] Debridement included 1 degenerative labrum 2 subacromial bursa     Operative findings:    torn superior labrum anterior and posterior degenerative TEAR;  intact biceps anchor, curtain or drape variant biceps attachment   Normal glenohumeral joint.  Normal subscapularis attachment.     Bursitis subacromial space, partial-thickness anterior bursal sided rotator cuff tear 2 to 3 mm depth.   Normal biceps tendon including normal bicipital groove   Normal range of motion preop

## 2021-07-09 DIAGNOSIS — Z299 Encounter for prophylactic measures, unspecified: Secondary | ICD-10-CM | POA: Diagnosis not present

## 2021-07-09 DIAGNOSIS — J449 Chronic obstructive pulmonary disease, unspecified: Secondary | ICD-10-CM | POA: Diagnosis not present

## 2021-07-09 DIAGNOSIS — E1165 Type 2 diabetes mellitus with hyperglycemia: Secondary | ICD-10-CM | POA: Diagnosis not present

## 2021-07-09 DIAGNOSIS — L0293 Carbuncle, unspecified: Secondary | ICD-10-CM | POA: Diagnosis not present

## 2021-07-09 DIAGNOSIS — Z794 Long term (current) use of insulin: Secondary | ICD-10-CM | POA: Diagnosis not present

## 2021-07-21 DIAGNOSIS — E1065 Type 1 diabetes mellitus with hyperglycemia: Secondary | ICD-10-CM | POA: Diagnosis not present

## 2021-07-24 ENCOUNTER — Telehealth: Payer: Self-pay | Admitting: Neurology

## 2021-07-24 NOTE — Telephone Encounter (Signed)
Pt's wife states she was told to call if they have yet to be contacted by DME, please call.

## 2021-07-24 NOTE — Telephone Encounter (Signed)
I have contacted the management with [employer] to have them look into this and to call them.  Pt should call the [place], [phone]. Will reach out after I have heard from the DME company

## 2021-07-25 NOTE — Telephone Encounter (Signed)
Called the wife and advised the company is working on his order and he will be set up through the [place] location. Informed the phone number to that location is [phone] and she can call them to check on the status

## 2021-08-05 NOTE — Progress Notes (Signed)
Virtual Visit via Telephone Note  I connected with Paul Webb on 08/07/21 at  8:40 AM EDT by telephone and verified that I am speaking with the correct person using two identifiers.  Location: Patient: home Provider: office Persons participated in the visit- patient, provider    I discussed the limitations, risks, security and privacy concerns of performing an evaluation and management service by telephone and the availability of in person appointments. I also discussed with the patient that there may be a patient responsible charge related to this service. The patient expressed understanding and agreed to proceed.  I discussed the assessment and treatment plan with the patient. The patient was provided an opportunity to ask questions and all were answered. The patient agreed with the plan and demonstrated an understanding of the instructions.   The patient was advised to call back or seek an in-person evaluation if the symptoms worsen or if the condition fails to improve as anticipated.  I provided 8 minutes of non-face-to-face time during this encounter.   Norman Clay, MD    [facility] MD/PA/NP OP Progress Note  08/07/2021 8:58 AM Paul Webb  MRN:  [id number]  Chief Complaint:  Chief Complaint   Follow-up; Depression    HPI:  - He had sleep study, which was consistent with severe osa  This is a follow-up appointment for depression and then insomnia.  He states that he is doing well.  He had 12 weeks of PT after shoulder surgery.  Although he is unable to bear heavy carrier, he has been able to mow that yard.  He feels good about recent glucose control.  He lost more than several pounds.  His mood is good and has good energy.  He enjoys reading Bibles and wisdom book for the past few months.  He has good appetite.  She denies feeling depressed or anhedonia.  He denies SI.  Although he feels anxious at times, it has been self-limited and has not occurred frequently.  He denies  irritability.  He takes hydroxyzine once a month for anxiety.  He feels comfortable to stay on the medication as it is.    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 MDD (major depressive disorder), recurrent, in full remission ([place])  F33.42     2. Insomnia, unspecified type  G47.00       Past Psychiatric History: Please see initial evaluation for full details. I have reviewed the history. No updates at this time.     Past Medical History:  Past Medical History:  Diagnosis Date   Arthritis    Circadian rhythm sleep disturbance 09/05/2019   Essential hypertension 07/30/2012   Fast heart beat    Gastro-esophageal reflux disease with esophagitis 07/30/2012   Generalized anxiety disorder 05/12/2017   GERD (gastroesophageal reflux disease)    History of kidney stones    Hyperlipidemia 07/30/2012   Major depressive disorder    OSA (obstructive sleep apnea)    does not use CPAP   Type 2 diabetes mellitus 07/30/2012    Past Surgical History:  Procedure Laterality Date   COLONOSCOPY     COLONOSCOPY N/A 10/06/2018  Procedure: COLONOSCOPY;  Surgeon: Rogene Houston, MD;  [facility];  Service: Endoscopy;  Laterality: N/A;  1:25   DRUG INDUCED ENDOSCOPY N/A 02/27/2021   Procedure: DRUG INDUCED ENDOSCOPY;  Surgeon: Jerrell Belfast, MD;  Location: [place];  Service: ENT;  Laterality: N/A;   FL INJ RT KNEE CT ARTHROGRAM ([place] HX)     NASAL SEPTOPLASTY W/ TURBINOPLASTY Bilateral 02/27/2021   Procedure: NASAL SEPTOPLASTY WITH TURBINATE REDUCTION;  Surgeon: Jerrell Belfast, MD;  Location: [place];  Service:  ENT;  Laterality: Bilateral;   POLYPECTOMY  10/06/2018   Procedure: POLYPECTOMY;  Surgeon: Rogene Houston, MD;  [facility];  Service: Endoscopy;;  colon    Rod in lower leg     trauma   Rt hernia repair     SHOULDER ARTHROSCOPY Right 04/16/2021   Procedure: RIGHT SHOULDER ARTHOROSCOPY AND DEBRIDEMENT;  Surgeon: Carole Civil, MD;  Location: [facility];  Service: Orthopedics;  Laterality: Right;    Family Psychiatric History: Please see initial evaluation for full details. I have reviewed the history. No updates at this time.     Family History:  Family History  Adopted: Yes  Family history unknown: Yes    Social History:  Social History   Socioeconomic History   Marital status: Married    Spouse name: Santiago Glad   Number of children: Not on file   Years of education: 12   Highest education level: High school graduate  Occupational History   Occupation: Retired  Tobacco Use   Smoking status: Never   Smokeless tobacco: Never  Scientific laboratory technician Use: Never used  Substance and Sexual Activity   Alcohol use: Never   Drug use: Never   Sexual activity: Not on file  Other Topics Concern   Not on file  Social History Narrative   Right Handed   Lives in one story with a basement    Drinks Caffeine    Social Determinants of Health   Financial Resource Strain: Not on file  Food Insecurity: Not on file  Transportation Needs: Not on file  Physical Activity: Not on file  Stress: Not on file  Social Connections: Not on file    Allergies: No Known Allergies  Metabolic Disorder Labs: Lab Results  Component Value Date   HGBA1C 6.4 (H) 04/15/2021   MPG 136.98 04/15/2021   No results found for: PROLACTIN No results found for: CHOL, TRIG, HDL, CHOLHDL, VLDL, LDLCALC No results found for: TSH  Therapeutic Level Labs: No results found for: LITHIUM No results found for: VALPROATE No components found for:  CBMZ  Current Medications: Current Outpatient  Medications  Medication Sig Dispense Refill   aspirin EC 81 MG tablet Take 1 tablet (81 mg total) by mouth daily.     atenolol (TENORMIN) 50 MG tablet Take 50 mg by mouth at bedtime.      BREO ELLIPTA 100-25 MCG/INH AEPB Inhale 1 puff into the lungs daily.     [START ON 08/13/2021] buPROPion (WELLBUTRIN XL) 300 MG 24 hr tablet Take 1 tablet (300 mg total) by mouth daily. 30 tablet 3   cetirizine (ZYRTEC) 10 MG tablet Take 10 mg by mouth at bedtime.      Cholecalciferol (VITAMIN D) 2000 units tablet Take 2,000 Units by mouth daily.     hydrOXYzine (ATARAX/VISTARIL) 25 MG tablet Take 1 tablet (25 mg total) by mouth daily as needed for anxiety. 90 tablet 0   lisinopril-hydrochlorothiazide (PRINZIDE,ZESTORETIC)  20-25 MG tablet Take 1 tablet by mouth daily.     meloxicam (MOBIC) 7.5 MG tablet Take 7.5 mg by mouth daily.     Multiple Vitamin (MULTIVITAMIN) tablet Take 1 tablet by mouth daily.     NOVOLOG 100 UNIT/ML injection Inject into the skin continuous. Via Insulin pump     omeprazole (PRILOSEC) 40 MG capsule Take 40 mg by mouth daily.     pravastatin (PRAVACHOL) 80 MG tablet Take 80 mg by mouth at bedtime.      [START ON 08/14/2021] sertraline (ZOLOFT) 100 MG tablet Take 1.5 tablets (150 mg total) by mouth daily. 45 tablet 3   [START ON 08/13/2021] traZODone (DESYREL) 100 MG tablet Take 1 tablet (100 mg total) by mouth at bedtime as needed for sleep. 30 tablet 3   No current facility-administered medications for this visit.     Musculoskeletal: Strength & Muscle Tone:  N/A Gait & Station:  N/A Patient leans: N/A  Psychiatric Specialty Exam: Review of Systems  Psychiatric/Behavioral:  Negative for agitation, behavioral problems, confusion, decreased concentration, dysphoric mood, hallucinations, self-injury, sleep disturbance and suicidal ideas. The patient is nervous/anxious. The patient is not hyperactive.   All other systems reviewed and are negative.  There were no vitals taken for  this visit.There is no height or weight on file to calculate BMI.  General Appearance: NA  Eye Contact:  NA  Speech:  Clear and Coherent  Volume:  Normal  Mood:   good  Affect:  NA  Thought Process:  Coherent  Orientation:  Full (Time, Place, and Person)  Thought Content: Logical   Suicidal Thoughts:  No  Homicidal Thoughts:  No  Memory:  Immediate;   Good  Judgement:  Good  Insight:  Good  Psychomotor Activity:  Normal  Concentration:  Concentration: Good and Attention Span: Good  Recall:  Good  Fund of Knowledge: Good  Language: Good  Akathisia:  No  Handed:  Right  AIMS (if indicated): not done  Assets:  Communication Skills Desire for Improvement  ADL's:  Intact  Cognition: WNL  Sleep:  Good   Screenings: PHQ2-9    Flowsheet Row Video Visit from 08/07/2021 in [place] Video Visit from 05/14/2021 in [place] Video Visit from 02/11/2021 in [place]  PHQ-2 Total Score 0 0 1      Flowsheet Row Admission (Discharged) from 04/16/2021 in [place] from 04/15/2021 in [place] Admission (Discharged) from 02/27/2021 in [place] No Risk Error: Question 6 not populated No Risk        Assessment and Plan:  KAVAUGHN KARLBERG is a 65 y.o. year old male with a history of depression, anxiety,  diabetes, hyperlipidemia, severe sleep apnea , who presents for follow up appointment for below.   1. MDD (major depressive disorder), recurrent, in full remission ([place]) He denies any significant mood symptoms since the last visit.  Recent psychosocial stressors includes status post shoulder surgery, although he has been recovering. Other psychosocial stressors includes demoralization due to diabetes and unemployment.  He enjoys daily activities, and he feels good about the current glucose control.  Will continue  sertraline and bupropion as maintenance treatment for depression.  Will continue hydroxyzine as needed for anxiety.  Noted that he has been on the same regimen since March of this year without significant mood symptoms; will consider tapering down bupropion at the next visit if he  denies any mood symptoms.   2. Insomnia, unspecified type He reports improvement in and insomnia.  He is currently waiting to get CPAP machine.  Will continue trazodone as needed for insomnia.    Plan I have reviewed and updated plans as below  1. Continue sertraline 150 mg daily  2. Continue bupropion 300 mg daily  3. Continue Trazodone 100 mg at night 4. Continue hydroxyzine 25 mg daily as needed for anxiety- he declined refill 5. Next appointment:  1/17 at 1 Pm for 20 mins, phone - TSH checked according to his wife a few weeks ago; wnl   Past trials of medication: ?lexapro, venlafaxine, Abilify   The patient demonstrates the following risk factors for suicide: Chronic risk factors for suicide include: psychiatric disorder of depression. Acute risk factors for suicide include: family or marital conflict and loss (financial, interpersonal, professional). Protective factors for this patient include: coping skills and hope for the future. Considering these factors, the overall suicide risk at this point appears to be low. Patient is appropriate for outpatient follow up.   Norman Clay, MD 08/07/2021, 8:58 AM

## 2021-08-07 ENCOUNTER — Telehealth (INDEPENDENT_AMBULATORY_CARE_PROVIDER_SITE_OTHER): Payer: Medicare PPO | Admitting: Psychiatry

## 2021-08-07 ENCOUNTER — Other Ambulatory Visit: Payer: Self-pay

## 2021-08-07 ENCOUNTER — Encounter: Payer: Self-pay | Admitting: Psychiatry

## 2021-08-07 DIAGNOSIS — G4700 Insomnia, unspecified: Secondary | ICD-10-CM

## 2021-08-07 DIAGNOSIS — F3342 Major depressive disorder, recurrent, in full remission: Secondary | ICD-10-CM | POA: Diagnosis not present

## 2021-08-07 MED ORDER — SERTRALINE HCL 100 MG PO TABS: 150.0000 mg | ORAL_TABLET | Freq: Every day | ORAL | 3 refills | Status: DC

## 2021-08-07 MED ORDER — BUPROPION HCL ER (XL) 300 MG PO TB24: 300.0000 mg | ORAL_TABLET | Freq: Every day | ORAL | 3 refills | Status: DC

## 2021-08-07 MED ORDER — TRAZODONE HCL 100 MG PO TABS: 100.0000 mg | ORAL_TABLET | Freq: Every evening | ORAL | 3 refills | Status: DC | PRN

## 2021-08-07 MED ORDER — HYDROXYZINE HCL 25 MG PO TABS: 25.0000 mg | ORAL_TABLET | Freq: Every day | ORAL | 0 refills | Status: AC | PRN

## 2021-08-07 NOTE — Patient Instructions (Signed)
1. Continue sertraline 150 mg daily  2. Continue bupropion 300 mg daily ( 3. Continue Trazodone 100 mg at night 4. Continue hydroxyzine 25 mg daily as needed for anxiety 5. Next appointment:  1/17 at 1 Pm

## 2021-08-09 DIAGNOSIS — L0293 Carbuncle, unspecified: Secondary | ICD-10-CM | POA: Diagnosis not present

## 2021-08-09 DIAGNOSIS — Z299 Encounter for prophylactic measures, unspecified: Secondary | ICD-10-CM | POA: Diagnosis not present

## 2021-08-09 DIAGNOSIS — E1165 Type 2 diabetes mellitus with hyperglycemia: Secondary | ICD-10-CM | POA: Diagnosis not present

## 2021-08-09 DIAGNOSIS — J449 Chronic obstructive pulmonary disease, unspecified: Secondary | ICD-10-CM | POA: Diagnosis not present

## 2021-08-09 DIAGNOSIS — Z794 Long term (current) use of insulin: Secondary | ICD-10-CM | POA: Diagnosis not present

## 2021-08-12 NOTE — Telephone Encounter (Signed)
Pt's wife states the DME in [place] no longer has the needed supplies.  Wife states they are ok with coming to [facility] for needed CPAP supplies, please call wife or pt to discuss changing to a [place] DME

## 2021-08-12 NOTE — Telephone Encounter (Signed)
The manager from the company states that Paul Webb has scheduled the pt.  "called back and her husband scheduled for 08-28-21 @ 1pm in [place]"

## 2021-08-12 NOTE — Telephone Encounter (Signed)
I have message the manager again to see what can be done for the patient.

## 2021-08-14 DIAGNOSIS — E1065 Type 1 diabetes mellitus with hyperglycemia: Secondary | ICD-10-CM | POA: Diagnosis not present

## 2021-08-14 DIAGNOSIS — E291 Testicular hypofunction: Secondary | ICD-10-CM | POA: Diagnosis not present

## 2021-08-14 DIAGNOSIS — E559 Vitamin D deficiency, unspecified: Secondary | ICD-10-CM | POA: Diagnosis not present

## 2021-08-14 DIAGNOSIS — E785 Hyperlipidemia, unspecified: Secondary | ICD-10-CM | POA: Diagnosis not present

## 2021-08-15 DIAGNOSIS — M720 Palmar fascial fibromatosis [Dupuytren]: Secondary | ICD-10-CM | POA: Diagnosis not present

## 2021-08-15 DIAGNOSIS — M79642 Pain in left hand: Secondary | ICD-10-CM | POA: Diagnosis not present

## 2021-08-17 DIAGNOSIS — M720 Palmar fascial fibromatosis [Dupuytren]: Secondary | ICD-10-CM

## 2021-08-17 HISTORY — DX: Palmar fascial fibromatosis (dupuytren): M72.0

## 2021-08-19 DIAGNOSIS — E1065 Type 1 diabetes mellitus with hyperglycemia: Secondary | ICD-10-CM | POA: Diagnosis not present

## 2021-08-21 DIAGNOSIS — E1065 Type 1 diabetes mellitus with hyperglycemia: Secondary | ICD-10-CM | POA: Diagnosis not present

## 2021-08-28 ENCOUNTER — Telehealth: Payer: Self-pay | Admitting: Neurology

## 2021-08-28 ENCOUNTER — Ambulatory Visit: Payer: Medicare PPO | Admitting: Orthopedic Surgery

## 2021-08-28 ENCOUNTER — Other Ambulatory Visit: Payer: Self-pay

## 2021-08-28 ENCOUNTER — Encounter: Payer: Self-pay | Admitting: Orthopedic Surgery

## 2021-08-28 VITALS — Ht 71.0 in | Wt 213.0 lb

## 2021-08-28 DIAGNOSIS — M25511 Pain in right shoulder: Secondary | ICD-10-CM | POA: Diagnosis not present

## 2021-08-28 DIAGNOSIS — Z9889 Other specified postprocedural states: Secondary | ICD-10-CM | POA: Diagnosis not present

## 2021-08-28 DIAGNOSIS — G8929 Other chronic pain: Secondary | ICD-10-CM | POA: Diagnosis not present

## 2021-08-28 DIAGNOSIS — G4733 Obstructive sleep apnea (adult) (pediatric): Secondary | ICD-10-CM | POA: Diagnosis not present

## 2021-08-28 NOTE — Progress Notes (Signed)
Chief Complaint  Patient presents with   Post-op Follow-up    04/16/21 right shoulder     6 months postop  PRE-OPERATIVE DIAGNOSIS:  Right shoulder labral tear and biceps tendonitis   POST-OPERATIVE DIAGNOSIS:  Right shoulder labral tear and biceps tendonitis   PROCEDURE:  Procedure(s): RIGHT SHOULDER ARTHOROSCOPY AND LIMITED DEBRIDEMENT (Right) -14643 Debridement included 1 degenerative labrum 2 subacromial bursa     Operative findings:    torn superior labrum anterior and posterior degenerative TEAR;  intact biceps anchor, curtain or drape variant biceps attachment   Normal glenohumeral joint.  Normal subscapularis attachment.     Bursitis subacromial space, partial-thickness anterior bursal sided rotator cuff tear 2 to 3 mm depth.   Normal biceps tendon including normal bicipital groove   Normal range of motion preop  The patient says he has some limitations in his shoulder.  But he is not in any pain  He does have external rotation of 40 degrees which is a little tight but is forward elevation active is 150 degrees  I released him I told him we will get full strength until about a year out

## 2021-08-28 NOTE — Telephone Encounter (Signed)
LVM requesting cb to schedule initial cpap. Needs between 09/29/21-11/28/21 and can see Dr. Brett Fairy, Jinny Blossom, [place], or [place].

## 2021-08-30 DIAGNOSIS — Z79899 Other long term (current) drug therapy: Secondary | ICD-10-CM | POA: Diagnosis not present

## 2021-08-30 DIAGNOSIS — Z0000 Encounter for general adult medical examination without abnormal findings: Secondary | ICD-10-CM | POA: Diagnosis not present

## 2021-08-30 DIAGNOSIS — E669 Obesity, unspecified: Secondary | ICD-10-CM | POA: Diagnosis not present

## 2021-08-30 DIAGNOSIS — Z6830 Body mass index (BMI) 30.0-30.9, adult: Secondary | ICD-10-CM | POA: Diagnosis not present

## 2021-08-30 DIAGNOSIS — R5383 Other fatigue: Secondary | ICD-10-CM | POA: Diagnosis not present

## 2021-08-30 DIAGNOSIS — Z7189 Other specified counseling: Secondary | ICD-10-CM | POA: Diagnosis not present

## 2021-08-30 DIAGNOSIS — Z1339 Encounter for screening examination for other mental health and behavioral disorders: Secondary | ICD-10-CM | POA: Diagnosis not present

## 2021-08-30 DIAGNOSIS — Z125 Encounter for screening for malignant neoplasm of prostate: Secondary | ICD-10-CM | POA: Diagnosis not present

## 2021-08-30 DIAGNOSIS — Z299 Encounter for prophylactic measures, unspecified: Secondary | ICD-10-CM | POA: Diagnosis not present

## 2021-08-30 DIAGNOSIS — E7800 Pure hypercholesterolemia, unspecified: Secondary | ICD-10-CM | POA: Diagnosis not present

## 2021-08-30 DIAGNOSIS — Z1331 Encounter for screening for depression: Secondary | ICD-10-CM | POA: Diagnosis not present

## 2021-08-30 DIAGNOSIS — E1165 Type 2 diabetes mellitus with hyperglycemia: Secondary | ICD-10-CM | POA: Diagnosis not present

## 2021-09-02 DIAGNOSIS — H5203 Hypermetropia, bilateral: Secondary | ICD-10-CM | POA: Diagnosis not present

## 2021-09-02 DIAGNOSIS — H52223 Regular astigmatism, bilateral: Secondary | ICD-10-CM | POA: Diagnosis not present

## 2021-09-02 DIAGNOSIS — Z794 Long term (current) use of insulin: Secondary | ICD-10-CM | POA: Diagnosis not present

## 2021-09-02 DIAGNOSIS — H524 Presbyopia: Secondary | ICD-10-CM | POA: Diagnosis not present

## 2021-09-02 DIAGNOSIS — H2513 Age-related nuclear cataract, bilateral: Secondary | ICD-10-CM | POA: Diagnosis not present

## 2021-09-02 DIAGNOSIS — E109 Type 1 diabetes mellitus without complications: Secondary | ICD-10-CM | POA: Diagnosis not present

## 2021-09-09 DIAGNOSIS — M720 Palmar fascial fibromatosis [Dupuytren]: Secondary | ICD-10-CM | POA: Diagnosis not present

## 2021-09-11 DIAGNOSIS — M25642 Stiffness of left hand, not elsewhere classified: Secondary | ICD-10-CM | POA: Diagnosis not present

## 2021-09-11 DIAGNOSIS — M720 Palmar fascial fibromatosis [Dupuytren]: Secondary | ICD-10-CM | POA: Diagnosis not present

## 2021-09-17 DIAGNOSIS — L739 Follicular disorder, unspecified: Secondary | ICD-10-CM | POA: Diagnosis not present

## 2021-09-17 DIAGNOSIS — Z23 Encounter for immunization: Secondary | ICD-10-CM | POA: Diagnosis not present

## 2021-09-17 DIAGNOSIS — L738 Other specified follicular disorders: Secondary | ICD-10-CM | POA: Diagnosis not present

## 2021-09-17 DIAGNOSIS — L720 Epidermal cyst: Secondary | ICD-10-CM | POA: Diagnosis not present

## 2021-09-17 DIAGNOSIS — E1065 Type 1 diabetes mellitus with hyperglycemia: Secondary | ICD-10-CM | POA: Diagnosis not present

## 2021-09-17 DIAGNOSIS — L910 Hypertrophic scar: Secondary | ICD-10-CM | POA: Diagnosis not present

## 2021-09-20 DIAGNOSIS — E1065 Type 1 diabetes mellitus with hyperglycemia: Secondary | ICD-10-CM | POA: Diagnosis not present

## 2021-09-26 DIAGNOSIS — G4733 Obstructive sleep apnea (adult) (pediatric): Secondary | ICD-10-CM | POA: Diagnosis not present

## 2021-09-28 DIAGNOSIS — G4733 Obstructive sleep apnea (adult) (pediatric): Secondary | ICD-10-CM | POA: Diagnosis not present

## 2021-09-30 DIAGNOSIS — M79642 Pain in left hand: Secondary | ICD-10-CM | POA: Diagnosis not present

## 2021-09-30 DIAGNOSIS — M25642 Stiffness of left hand, not elsewhere classified: Secondary | ICD-10-CM | POA: Diagnosis not present

## 2021-09-30 DIAGNOSIS — M720 Palmar fascial fibromatosis [Dupuytren]: Secondary | ICD-10-CM | POA: Diagnosis not present

## 2021-10-03 ENCOUNTER — Encounter: Payer: Self-pay | Admitting: Neurology

## 2021-10-03 ENCOUNTER — Other Ambulatory Visit: Payer: Self-pay

## 2021-10-03 ENCOUNTER — Ambulatory Visit: Payer: Medicare PPO | Admitting: Neurology

## 2021-10-03 VITALS — BP 138/81 | HR 68 | Ht 71.0 in | Wt 219.5 lb

## 2021-10-03 DIAGNOSIS — G4734 Idiopathic sleep related nonobstructive alveolar hypoventilation: Secondary | ICD-10-CM | POA: Diagnosis not present

## 2021-10-03 DIAGNOSIS — E669 Obesity, unspecified: Secondary | ICD-10-CM

## 2021-10-03 DIAGNOSIS — G4739 Other sleep apnea: Secondary | ICD-10-CM

## 2021-10-03 DIAGNOSIS — G4733 Obstructive sleep apnea (adult) (pediatric): Secondary | ICD-10-CM | POA: Diagnosis not present

## 2021-10-03 DIAGNOSIS — Z9889 Other specified postprocedural states: Secondary | ICD-10-CM

## 2021-10-03 HISTORY — DX: Other sleep apnea: G47.39

## 2021-10-03 NOTE — Progress Notes (Signed)
Paul Webb    SLEEP MEDICINE CLINIC    Provider:  Larey Seat, MD  Primary Care Physician:  Paul Chroman, MD [place]  [id number]     Referring Provider: Dr Paul Belfast, MD   Paul Cornea, MD         Chief Complaint according to patient   Patient presents with:     New Patient (Initial Visit)     No show for 05-10-2019 visit with Dr. Rexene Webb.       HISTORY OF PRESENT ILLNESS:  Paul Webb is a 65 y.o. year old  male patient seen here on 10/03/2021 , RV after re-evaluation of sleep apnea. He underwent Paul Webb sleep study was a home sleep test which took place on 05-15-2021.  The he underwent nasal septal surgery prior to this sleep study was able to sleep 7 hours 18 minutes with only 9% REM sleep.  His AHI was still very high 67.6 apneas and hypopneas per hour of sleep not a lot of difference between REM and non-REM sleep and there was not a positional dependent sleep either.  So we are facing the problem that he needed to have a rather severe apnea treatment but also that loud snoring had to be eliminated which was also seen during the study and he had prolonged hypoxia 43 minutes of the total sleep time were seen with an oxygen desaturation.  This is what cannot be corrected by an inspire device or dental device and may just use CPAP.  The the patient's compliance report on his new CPAP shows 100% compliance for the last 30 days 8 hours and 59 minutes with his average daily use at time minimum pressure was set at 6 and maximum at 20 cmH2O was 2 cm EPR at the patient has unfortunately some central apneas now arising but his obstructive apneas were well controlled.  So 95th percentile pressure is currently 13.5 cm water.  Air leak is minimal in spite of the mask pushing on his nasal bridge and leaving a sore spot.  He is using a full facemask and by [employer].  The AHI was 22.5/h which means his overall sleep apnea was only reduced by 11 little overall health and what we see now is  more central apnea arising.  Paul Webb will have to return for is an attended BiPAP titration, and if needed we will add oxygen.  At this time I was not able to see if his CPAP has actually helped eliminating hypoxia as indicated to be present during his home sleep test.  He is not snoring his wife reports that he is much less restless at night and he feels overall that he sleeps better and has a higher sleep quality so this is already is already a success but it is not going far enough for me.  In addition he has actually lost some weight his BMI is now 30.  And I will ask his DME to provide a different mask possibly AirTouch with memory foam so that we can eliminate the pressure points on the nasal bridge.       Initial visit post HST and ENT evaluation, surgery to improve nasal airway.  The patient underwent a home sleep test this was in October 2020 and at that time he was diagnosed with severe sleep apnea his AHI was 46.8 but was not accentuated by REM sleep.  He did not have major oxygen desaturation.  He had more apnea in  supine sleep position with an AHI of 51.  So I would like for this patient to have a repeat home sleep test now that is now that his nasal passage has been improved, he had undergone nasal septoplasty by lip bilateral inferior turbinate reduction and did a drug-induced sleep endoscopy under Dr. Victorio Webb guidance.   This was on February 27, 2021.  The procedure was done under propofol I would like to add that the surgery involved a restoration of nasal patency.   BMI is 30. 13. Kg/m2.   He will need a new HST        CONSULT note -from [place]., his psychiatrist. He has had a telephone conversation only on 04-27-2019.  Chief concern according to patient : Hypersomnia, staying in bed all day, not sleeping through the night.    I have the pleasure of seeing Paul Webb today, a right handed Caucasian male with a possible sleep disorder. 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 Diagnosed at age 37.    Sleep relevant medical history:  GAD, anxiety- Nocturia - one , son uses CPAP.   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 and lives in a household with his spouse, he is married status , with 4 adult children,.  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  Pets:  A cat is present. Tobacco use never .   ETOH use none ,  Caffeine intake in form of Coffee( quit 5 month ago ) Soda( caffeine mountain dew ) Tea ( none) or energy drinks. Regular exercise; none .   Hobbies ; yard work.     Sleep habits are as follows: The patient's dinner time is between 6-7  PM.  The patient goes to bed at 12 midnight - asleep within 1 hour  , has racing thoughts- once asleep he continues to sleep for 3 hours, wakes for one bathroom break. The bedroom is cool, quiet and dark.  The preferred sleep position is laterally , right preferred. , with the support of 2 pillows.  Dreams are reportedly frequent/vivid.  The patient wakes up spontaneously around noon.  10-12 AM is the usual rise time. No alarm set (!), sleep time 3-4 at night and 3-4 in daytime. He is bed for most of the day .  He reports not feeling refreshed or restored in AM, with symptoms such as dry mouth all the time , rarely morning headaches , and residual fatigue.  Naps are taken frequently, lasting from 30-60  minutes and are not more refreshing than nocturnal sleep.    Review of Systems: Out of a complete 14  system review, the patient complains of only the following symptoms, and all other reviewed systems are negative.:  Fatigue, sleepiness , snoring, fragmented sleep,   How likely are you to doze in the following situations: 0 = not likely, 1 = slight chance, 2 = moderate chance, 3 = high chance   Sitting and Reading? Watching Television? Sitting inactive in a public place (theater or meeting)? As a passenger in a car for an hour without a break? Lying down in the afternoon  when circumstances permit? Sitting and talking to someone? Sitting quietly after lunch without alcohol? In a car, while stopped for a few minutes in traffic?  He is not feeling better rested since ENT surgery, still snores very loudly.    less restless and fully refreshed.  Total = 5 on CPAP down from  from 10/ 24 points   FSS endorsed at CPAP at 20 from 40 from 62/ 63 points.  his fatigue, has responded-  Central apnea. Arising.      Social History   Socioeconomic History   Marital status: Married    Spouse name: Santiago Glad   Number of children: Not on file   Years of education: 12   Highest education level: High school graduate  Occupational History   Occupation: Retired  Tobacco Use   Smoking status: Never   Smokeless tobacco: Never  Vaping Use   Vaping Use: Never used  Substance and Sexual Activity   Alcohol use: Never   Drug use: Never   Sexual activity: Not on file  Other Topics Concern   Not on file  Social History Narrative   Right Handed   Lives in one story with a basement    Drinks Caffeine    Social Determinants of Health   Financial Resource Strain: Not on file  Food Insecurity: Not on file  Transportation Needs: Not on file  Physical Activity: Not on file  Stress: Not on file  Social Connections: Not on file    Family History  Adopted: Yes  Family history unknown: Yes    Past Medical History:  Diagnosis Date   Arthritis    Circadian rhythm sleep disturbance 09/05/2019    Essential hypertension 07/30/2012   Fast heart beat    Gastro-esophageal reflux disease with esophagitis 07/30/2012   Generalized anxiety disorder 05/12/2017   GERD (gastroesophageal reflux disease)    History of kidney stones    Hyperlipidemia 07/30/2012   Major depressive disorder    OSA (obstructive sleep apnea)    does not use CPAP   Type 2 diabetes mellitus 07/30/2012    Past Surgical History:  Procedure Laterality Date   COLONOSCOPY     COLONOSCOPY N/A 10/06/2018   Procedure: COLONOSCOPY;  Surgeon: Rogene Houston, MD;  [facility];  Service: Endoscopy;  Laterality: N/A;  1:25   DRUG INDUCED ENDOSCOPY N/A 02/27/2021   Procedure: DRUG INDUCED ENDOSCOPY;  Surgeon: Paul Belfast, MD;  Location: [facility];  Service: ENT;  Laterality: N/A;   FL INJ RT KNEE CT ARTHROGRAM ([place] [facility])     NASAL SEPTOPLASTY W/ TURBINOPLASTY Bilateral 02/27/2021   Procedure: NASAL SEPTOPLASTY WITH TURBINATE REDUCTION;  Surgeon: Paul Belfast, MD;  Location: [facility];  Service: ENT;  Laterality: Bilateral;   POLYPECTOMY  10/06/2018   Procedure: POLYPECTOMY;  Surgeon: Rogene Houston, MD;  [facility];  Service: Endoscopy;;  colon    Rod in lower leg     trauma   Rt hernia repair     SHOULDER ARTHROSCOPY Right 04/16/2021   Procedure: RIGHT SHOULDER ARTHOROSCOPY AND DEBRIDEMENT;  Surgeon: Carole Civil, MD;  Location: [facility];  Service: Orthopedics;  Laterality: Right;     Current Outpatient Medications on File Prior to Visit  Medication Sig Dispense Refill   aspirin EC 81 MG tablet Take 1 tablet (81 mg total) by mouth daily.     atenolol (TENORMIN) 50 MG tablet Take 50 mg by mouth at bedtime.  BREO ELLIPTA 100-25 MCG/INH AEPB Inhale 1 puff into the lungs daily.     buPROPion (WELLBUTRIN XL) 300 MG 24 hr tablet Take 1 tablet (300 mg total) by mouth daily. 30 tablet 3   cetirizine (ZYRTEC) 10 MG tablet Take 10 mg by mouth at bedtime.       Cholecalciferol (VITAMIN D) 2000 units tablet Take 2,000 Units by mouth daily.     hydrOXYzine (ATARAX/VISTARIL) 25 MG tablet Take 1 tablet (25 mg total) by mouth daily as needed for anxiety. 90 tablet 0   lisinopril-hydrochlorothiazide (PRINZIDE,ZESTORETIC) 20-25 MG tablet Take 1 tablet by mouth daily.     meloxicam (MOBIC) 7.5 MG tablet Take 7.5 mg by mouth daily.     Multiple Vitamin (MULTIVITAMIN) tablet Take 1 tablet by mouth daily.     NOVOLOG 100 UNIT/ML injection Inject into the skin continuous. Via Insulin pump     omeprazole (PRILOSEC) 40 MG capsule Take 40 mg by mouth daily.     pravastatin (PRAVACHOL) 80 MG tablet Take 80 mg by mouth at bedtime.      sertraline (ZOLOFT) 100 MG tablet Take 1.5 tablets (150 mg total) by mouth daily. 45 tablet 3   traZODone (DESYREL) 100 MG tablet Take 1 tablet (100 mg total) by mouth at bedtime as needed for sleep. 30 tablet 3   No current facility-administered medications on file prior to visit.    Physical exam:  Today's Vitals   10/03/21 1522  BP: 138/81  Pulse: 68  Weight: 219 lb 8 oz (99.6 kg)  Height: 5\' 11"  (1.540 m)   Body mass index is 30.61 kg/m.   Wt Readings from Last 3 Encounters:  10/03/21 219 lb 8 oz (99.6 kg)  08/28/21 213 lb (96.6 kg)  05/08/21 216 lb (98 kg)     Ht Readings from Last 3 Encounters:  10/03/21 5\' 11"  (1.803 m)  08/28/21 5\' 11"  (1.803 m)  05/08/21 5\' 11"  (1.803 m)      General: The patient is awake, alert and appears not in acute distress. The patient is well groomed. Head: Normocephalic, atraumatic. Neck is supple. Mallampati 3- a lot of soft tissue.  neck circumference:*16.5 inches . Nasal airflow patent.  Retrognathia is seen.  Dental status: crowded.  Cardiovascular:  Regular rate and cardiac rhythm by pulse,  without distended neck veins. Respiratory: Lungs are clear to auscultation.  Skin:  Without evidence of ankle edema, or rash. Trunk: The patient's posture is erect.   Neurologic exam  : The patient is awake and alert, oriented to place and time.   Memory subjective described as intact.  Attention span & concentration ability appears normal.  Speech is fluent,  without  dysarthria, dysphonia or aphasia.  Mood and affect are appropriate.   Cranial nerves: no loss of smell or taste reported  Pupils are equal and briskly reactive to light. Funduscopic exam deferred. .  Extraocular movements in vertical and horizontal planes were intact and without nystagmus. No Diplopia. Visual fields by finger perimetry are intact. Hearing was intact to soft voice and finger rubbing.   Facial sensation intact to fine touch. Facial motor strength is symmetric and tongue and uvula move midline.  Neck ROM : rotation, tilt and flexion extension were normal for age and shoulder shrug was symmetrical.    Motor exam:  Symmetric bulk, tone and ROM.   Normal tone without cog wheeling, symmetric grip strength .   Gait and station: Patient could rise unassisted from a seated position, walked  without assistive device.  Toe and heel walk were deferred.  Deep tendon reflexes: in the  upper and lower extremities are symmetric and intact.  Babinski response was deferred .       After spending a total time of 21  minutes face to face and additional time for physical and neurologic examination, review of laboratory studies,  personal review of imaging studies, reports and results of other testing and review of referral information / records as far as provided in visit, I have established the following assessments:  1) confirmed severe OSA in 2020- now had nasal septal and a turbinate reduction with Dr Wilburn Cornelia but still snores. Was diagnosed again with severe apnea and hypoxia, ruled out Inspitre. Now central apneas are arising under auto CPAP.  He is interested in Rudd procedure.    My Plan is to proceed with:  1)  We need to address central apnea and possible still present hypoxia in an attended  BiPAP/ ASV titration.  DME asked to provide an airtouch [employer] FFM or dream wear , F 30 I FFM.  I would like to thank Dr Wilburn Cornelia and Dr. Modesta Messing [address] [place],   [address] for allowing me to meet with and to take care of this pleasant patient.   I plan to follow up either personally or through our NP within 2-3  month.   CC: I will share my notes with  PCP   Electronically signed by: Paul Seat, MD 10/03/2021 3:46 PM  [facility] and [facility] Sleep Board certified by The [employer] of Sleep Medicine and Diplomate of the [employer] of Sleep Medicine. Board certified In Neurology through the [place], Fellow of the [employer] of Neurology. Medical Director of [employer].

## 2021-10-07 DIAGNOSIS — Z299 Encounter for prophylactic measures, unspecified: Secondary | ICD-10-CM | POA: Diagnosis not present

## 2021-10-07 DIAGNOSIS — J449 Chronic obstructive pulmonary disease, unspecified: Secondary | ICD-10-CM | POA: Diagnosis not present

## 2021-10-07 DIAGNOSIS — Z789 Other specified health status: Secondary | ICD-10-CM | POA: Diagnosis not present

## 2021-10-07 DIAGNOSIS — E1165 Type 2 diabetes mellitus with hyperglycemia: Secondary | ICD-10-CM | POA: Diagnosis not present

## 2021-10-07 DIAGNOSIS — Z6830 Body mass index (BMI) 30.0-30.9, adult: Secondary | ICD-10-CM | POA: Diagnosis not present

## 2021-10-21 DIAGNOSIS — E1065 Type 1 diabetes mellitus with hyperglycemia: Secondary | ICD-10-CM | POA: Diagnosis not present

## 2021-10-26 DIAGNOSIS — G4733 Obstructive sleep apnea (adult) (pediatric): Secondary | ICD-10-CM | POA: Diagnosis not present

## 2021-10-28 DIAGNOSIS — G4733 Obstructive sleep apnea (adult) (pediatric): Secondary | ICD-10-CM | POA: Diagnosis not present

## 2021-11-05 ENCOUNTER — Telehealth: Payer: Self-pay | Admitting: Neurology

## 2021-11-05 NOTE — Telephone Encounter (Signed)
LVM for pt to call me back to schedule sleep study  

## 2021-11-11 ENCOUNTER — Other Ambulatory Visit: Payer: Self-pay | Admitting: Psychiatry

## 2021-11-13 DIAGNOSIS — E559 Vitamin D deficiency, unspecified: Secondary | ICD-10-CM | POA: Diagnosis not present

## 2021-11-13 DIAGNOSIS — E785 Hyperlipidemia, unspecified: Secondary | ICD-10-CM | POA: Diagnosis not present

## 2021-11-13 DIAGNOSIS — E291 Testicular hypofunction: Secondary | ICD-10-CM | POA: Diagnosis not present

## 2021-11-13 DIAGNOSIS — E1065 Type 1 diabetes mellitus with hyperglycemia: Secondary | ICD-10-CM | POA: Diagnosis not present

## 2021-11-19 DIAGNOSIS — E1065 Type 1 diabetes mellitus with hyperglycemia: Secondary | ICD-10-CM | POA: Diagnosis not present

## 2021-11-20 DIAGNOSIS — E1065 Type 1 diabetes mellitus with hyperglycemia: Secondary | ICD-10-CM | POA: Diagnosis not present

## 2021-11-26 DIAGNOSIS — G4733 Obstructive sleep apnea (adult) (pediatric): Secondary | ICD-10-CM | POA: Diagnosis not present

## 2021-11-28 DIAGNOSIS — E1165 Type 2 diabetes mellitus with hyperglycemia: Secondary | ICD-10-CM | POA: Diagnosis not present

## 2021-11-28 DIAGNOSIS — F321 Major depressive disorder, single episode, moderate: Secondary | ICD-10-CM | POA: Diagnosis not present

## 2021-11-28 DIAGNOSIS — Z299 Encounter for prophylactic measures, unspecified: Secondary | ICD-10-CM | POA: Diagnosis not present

## 2021-11-28 DIAGNOSIS — J449 Chronic obstructive pulmonary disease, unspecified: Secondary | ICD-10-CM | POA: Diagnosis not present

## 2021-11-28 DIAGNOSIS — G4733 Obstructive sleep apnea (adult) (pediatric): Secondary | ICD-10-CM | POA: Diagnosis not present

## 2021-11-28 DIAGNOSIS — J069 Acute upper respiratory infection, unspecified: Secondary | ICD-10-CM | POA: Diagnosis not present

## 2021-12-01 ENCOUNTER — Other Ambulatory Visit: Payer: Self-pay | Admitting: Psychiatry

## 2021-12-02 NOTE — Telephone Encounter (Signed)
Ordered refills based on the request. Could you contact the patient to have follow up appointment sometime this month. It appears that the appointment is not scheduled on our end as discussed.

## 2021-12-03 ENCOUNTER — Other Ambulatory Visit: Payer: Self-pay

## 2021-12-03 ENCOUNTER — Ambulatory Visit (INDEPENDENT_AMBULATORY_CARE_PROVIDER_SITE_OTHER): Payer: Medicare PPO | Admitting: Neurology

## 2021-12-03 DIAGNOSIS — G4734 Idiopathic sleep related nonobstructive alveolar hypoventilation: Secondary | ICD-10-CM

## 2021-12-03 DIAGNOSIS — Z9889 Other specified postprocedural states: Secondary | ICD-10-CM

## 2021-12-03 DIAGNOSIS — G4733 Obstructive sleep apnea (adult) (pediatric): Secondary | ICD-10-CM

## 2021-12-03 DIAGNOSIS — E669 Obesity, unspecified: Secondary | ICD-10-CM

## 2021-12-03 DIAGNOSIS — G4739 Other sleep apnea: Secondary | ICD-10-CM

## 2021-12-14 MED ORDER — ESZOPICLONE 1 MG PO TABS: 1.0000 mg | ORAL_TABLET | Freq: Every evening | ORAL | 0 refills | Status: DC | PRN

## 2021-12-14 NOTE — Progress Notes (Signed)
Cc Dr Woody Seller, MD Treatment Emergent Central apnea, 1. Central Sleep Apnea under CPAP did not respond to CPAP between 13 and 17 cm water pressure but were reduce to single digits under BiPAP with a 4 cm water spread- the best numeric result for sleep duration and low AHI was seen at 23/19 cm water.  1. The patient was fitted with a F 20 model in large, a FFM by [employer]. 2. Sleep Related Hypoxemia was seen for 25 minutes, Nadir at 84%. This also resolved under BiPAP therapy.   PLANS/RECOMMENDATIONS: 1. BiPAP therapy will be initiated with auto BIPAP, 4 cm water pressure spread and beginning at 14/10 cm water through 24/20 cm water. BiPAP compliance is defined as 4 hours or more of nightly use. 2. The patient was left with many spontaneous arousals after BiPAP had been able to reduce the central apnea. I will prescribe a sleep aid for him, temporarily.      DISCUSSION: I prescribed Lunesta at 1 mg po at night for the first 28 days of therapy. A follow up appointment will be scheduled in the Sleep Clinic at [facility].   Please call [phone] with any questions

## 2021-12-14 NOTE — Procedures (Signed)
PATIENT'S NAME:  Paul Webb, Paul Webb DOB:      1956-03-21      MR#:    [id number]     DATE OF RECORDING: 12/03/2021 REFERRING M.D.:  Jerrell Belfast, MD , PCP : Dr. Tacey Heap Performed:   CPAP / BiPAP Titration HISTORY: Treatment Emergent Central apnea   Paul Webb is a 66 y.o. year old -male patient seen at [facility] on 10/03/2021, RV after re-evaluation of sleep apnea. He underwent a home sleep test which took place on 05-15-2021.  He had undergone nasal septal surgery prior to this sleep study and was able to sleep 7 hours 18 minutes with only 9% REM sleep.  His AHI was still very high 67./ hour without a lot of difference between REM and non-REM sleep, and there was not a positional dependent sleep either.  So we were facing the problem that he needed to have a rather severe apnea treatment but also that loud snoring had to be eliminated and he had prolonged hypoxia for 43 minutes of the total sleep time.  This is what cannot be corrected by an inspire device or dental device and may just use PAP.   The patient's compliance report on his new CPAP shows 100% compliance for the last 30 days 8 hours and 59 minutes with his average daily use at time minimum pressure was set at 6 and maximum at 20 cmH2O with 2 cm EPR. Download showed he patient has unfortunately some central apneas now arising but his obstructive apneas were well controlled.   So 95th percentile pressure is currently 13.5 cm water.  Air leak is minimal in spite of the mask pushing on his nasal bridge and leaving a sore spot.  He is using a full facemask by [employer].  The AHI was 22.5/h which means his overall sleep apnea was only reduced by 2/3 on auto CPAP and what we see more central apnea arising. We are trying him on BiPAP next.   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  The patient endorsed the Epworth Sleepiness Scale at 5 points, down from 10 points pre-CPAP.  FSS at 20, down from 40 pr- CPAP   The patient's weight 219 pounds with a height of 71 (inches), resulting in a BMI of 30.6 kg/m2. The patient's neck circumference measured 16.5 inches.  CURRENT MEDICATIONS: Aspirin, Tenormin, Breo Ellipta, Wellbutrin XL, Zyrtec, Vitamin D, Atarax, Prinzide, Mobic, Multivitamin, Novolog, Prilosec, Pravachol, Zoloft, Desyrel   PROCEDURE:  This is a multichannel digital polysomnogram utilizing the SomnoStar 11.2 system.  Electrodes and sensors were applied and monitored per AASM Specifications.   EEG, EOG, Chin and Limb EMG, were sampled at 200 Hz.  ECG, Snore and Nasal Pressure, Thermal Airflow, Respiratory Effort, CPAP Flow and Pressure, Oximetry was sampled at 50 Hz. Digital video and audio were recorded.       Sleep was initiated late, about 23.45 hours, and CPAP was initiated at 13 cmH20 with heated humidity per AASM standards and pressure was step-by Step advanced to 17 cmH20 because of hypopneas, apneas and desaturations. CPAP did not resolve sleep apneas and there was reduced sleep efficiency.  Change to BiPAP  21/17 cm water pressure with a reduction of the AHI to 4.8/h with improvement  of sleep apnea. Still highly fragmented sleep, and all residual apneas were central apneas.  Final pressure of BiPAP at 23/19 cm water reduced the AHI to 1.5, allowed for 92% sleep efficiency over 79 minutes.   Lights Out was at 20:49 and Lights On at 05:02. Total recording time (TRT) was 492.5 minutes, with a total sleep time (TST) of 271 minutes. The patient's sleep latency was 173.5 minutes. REM latency was 352 minutes.  The overall sleep efficiency was 55.0 %.    SLEEP ARCHITECTURE: WASO (Wake  after sleep onset) was 186 minutes.  There were 47 minutes in Stage N1, 208 minutes Stage N2, 14 minutes Stage N3 and 2 minutes in Stage REM.  The percentage of Stage N1 was 17.3%, Stage N2 was 76.8%, Stage N3 was 5.2% and Stage R (REM sleep) was 0.7%. The sleep architecture was notable for highly fragmented sleep and only one brief REM sleep break through at 3.15 AM under BiPAP.  RESPIRATORY ANALYSIS:  There was a total of 45 respiratory events: 0 obstructive apneas, 12 central apneas and 0 mixed apneas with a total of 12 apneas and an apnea index (AI) of 2.7 /hour. There were 33 hypopneas with a hypopnea index of 7.3/hour.   The total APNEA/HYPOPNEA INDEX  (AHI) was 10.0 /hour -only one apnea occurred in REM sleep and 44 events in NREM.  The REM AHI was 30 /hour versus a non-REM AHI of 9.8 /hour. The patient spent 90 minutes of total sleep time in the supine position and 181 minutes in non-supine.  The supine AHI was 12.6, versus a non-supine AHI of 8.6.  OXYGEN SATURATION & C02:  The baseline 02 saturation was 92%, with the lowest being 84%. Time spent below 89% saturation equaled 25 minutes. The arousals were noted as: 157 were spontaneous, 0 were associated with PLMs, 12 were associated with respiratory events. The patient had a total of 0 Periodic Limb Movements.  EKG was in keeping with regular rhythm, sinus bradycardia.  DIAGNOSIS Central Sleep Apnea under CPAP did not respond to CPAP between 13 and 17 cm water pressure but were reduce to single digits under BiPAP with a 4 cm water spread- the best numeric result for sleep duration and low AHI was seen at 23/19 cm water.  The patient was fitted with a F 20 model in large, a FFM by [employer]. Sleep Related Hypoxemia was seen for 25 minutes, Nadir at 84%. This also resolved under BiPAP therapy.   PLANS/RECOMMENDATIONS: BiPAP therapy will be initiated with auto BIPAP, 4 cm water pressure spread and beginning at 14/10 cm water through 24/20 cm  water. BiPAP compliance is defined as 4 hours or more of nightly use. The patient was left with many spontaneous arousals after BiPAP had been able to reduce the central apnea. I will prescribe a sleep aid for him, temporarily.      DISCUSSION: I prescribed Lunesta at 1 mg po at night for the first 28 days of therapy. A follow up appointment will be scheduled in the Sleep Clinic at [facility].   Please call [phone] with any questions.      I certify that I have reviewed the entire raw data recording prior to the issuance of this report in accordance with the Standards of Accreditation of the American Academy of Sleep Medicine (AASM)  Larey Seat, M.D. Medical Director, [employer] [facility] [employer], [employer] of Neurology and Sleep Medicine ( Neurology and Sleep Medicine)

## 2021-12-14 NOTE — Addendum Note (Signed)
Addended by: Larey Seat on: 12/14/2021 04:13 PM   Modules accepted: Orders

## 2021-12-15 NOTE — Progress Notes (Signed)
Virtual Visit via Telephone Note  I connected with Paul Webb on 12/17/21 at  9:30 AM EST by telephone and verified that I am speaking with the correct person using two identifiers.  Location: Patient: home Provider: office Persons participated in the visit- patient, provider    I discussed the limitations, risks, security and privacy concerns of performing an evaluation and management service by telephone and the availability of in person appointments. I also discussed with the patient that there may be a patient responsible charge related to this service. The patient expressed understanding and agreed to proceed.    I discussed the assessment and treatment plan with the patient. The patient was provided an opportunity to ask questions and all were answered. The patient agreed with the plan and demonstrated an understanding of the instructions.   The patient was advised to call back or seek an in-person evaluation if the symptoms worsen or if the condition fails to improve as anticipated.  I provided 13 minutes of non-face-to-face time during this encounter.   Norman Clay, MD    [facility] MD/PA/NP OP Progress Note  12/17/2021 9:57 AM [name]  MRN:  [id number]  Chief Complaint:  Chief Complaint   Follow-up; Depression    HPI:  This is a follow-up appointment for depression and insomnia.  He states that he has been doing very well.  He had a good holiday with his children and his grandchildren.  He thinks he has been doing better than he used to be.  He is not irritable or anxious anymore.  Although he has not been able to do any exercise, he does some stress for his shoulder.  He is looking forward to spring, where he can get out more frequently.  He tries to "work through it" when he was asked about the stress he mentioned in the past about unemployment, taking care of diabetes.  He states that medication has been helpful, and he thinks his wife has been supportive as well.  He  wants to stay on the same regimen as he has been doing quite well.  He sleeps well.  He denies feeling depressed anhedonia.  He denies change in appetite.  He has good energy.  He denies SI.   Santiago Glad, his wife presents to the interview.  He has been doing very well.  He goes to church more frequently.  He was recommended to use BiPAP machine after having another sleep study.  He was prescribed Lunesta by his neurologist.   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 MDD (major depressive disorder), recurrent, in full remission ([place])  F33.42     2. Insomnia, unspecified type  G47.00       Past Psychiatric History: Please see initial evaluation for full details. I have reviewed the history. No updates at this time.     Past Medical History:  Past Medical History:  Diagnosis Date   Arthritis    Circadian rhythm sleep disturbance 09/05/2019   Essential hypertension 07/30/2012   Fast heart beat    Gastro-esophageal reflux disease with esophagitis 07/30/2012   Generalized anxiety disorder 05/12/2017   GERD (gastroesophageal  reflux disease)    History of kidney stones    Hyperlipidemia 07/30/2012   Major depressive disorder    OSA (obstructive sleep apnea)    does not use CPAP   Type 2 diabetes mellitus 07/30/2012    Past Surgical History:  Procedure Laterality Date   COLONOSCOPY     COLONOSCOPY N/A 10/06/2018   Procedure: COLONOSCOPY;  Surgeon: Rogene Houston, MD;  [facility];  Service: Endoscopy;  Laterality: N/A;  1:25   DRUG INDUCED ENDOSCOPY N/A 02/27/2021   Procedure: DRUG INDUCED ENDOSCOPY;  Surgeon: Jerrell Belfast, MD;   Location: [place];  Service: ENT;  Laterality: N/A;   FL INJ RT KNEE CT ARTHROGRAM ([place] [facility])     NASAL SEPTOPLASTY W/ TURBINOPLASTY Bilateral 02/27/2021   Procedure: NASAL SEPTOPLASTY WITH TURBINATE REDUCTION;  Surgeon: Jerrell Belfast, MD;  Location: [place];  Service: ENT;  Laterality: Bilateral;   POLYPECTOMY  10/06/2018   Procedure: POLYPECTOMY;  Surgeon: Rogene Houston, MD;  [facility];  Service: Endoscopy;;  colon    Rod in lower leg     trauma   Rt hernia repair     SHOULDER ARTHROSCOPY Right 04/16/2021   Procedure: RIGHT SHOULDER ARTHOROSCOPY AND DEBRIDEMENT;  Surgeon: Carole Civil, MD;  Location: [facility];  Service: Orthopedics;  Laterality: Right;    Family Psychiatric History: Please see initial evaluation for full details. I have reviewed the history. No updates at this time.     Family History:  Family History  Adopted: Yes  Family history unknown: Yes    Social History:  Social History   Socioeconomic History   Marital status: Married    Spouse name: Santiago Glad   Number of children: Not on file   Years of education: 12   Highest education level: High school graduate  Occupational History   Occupation: Retired  Tobacco Use   Smoking status: Never   Smokeless tobacco: Never  Scientific laboratory technician Use: Never used  Substance and Sexual Activity   Alcohol use: Never   Drug use: Never   Sexual activity: Not on file  Other Topics Concern   Not on file  Social History Narrative   Right Handed   Lives in one story with a basement    Drinks Caffeine    Social Determinants of Health   Financial Resource Strain: Not on file  Food Insecurity: Not on file  Transportation Needs: Not on file  Physical Activity: Not on file  Stress: Not on file  Social Connections: Not on file    Allergies: No Known Allergies  Metabolic Disorder Labs: Lab Results  Component Value Date   HGBA1C 6.4 (H) 04/15/2021   MPG 136.98  04/15/2021   No results found for: PROLACTIN No results found for: CHOL, TRIG, HDL, CHOLHDL, VLDL, LDLCALC No results found for: TSH  Therapeutic Level Labs: No results found for: LITHIUM No results found for: VALPROATE No components found for:  CBMZ  Current Medications: Current Outpatient Medications  Medication Sig Dispense Refill   hydrOXYzine (ATARAX) 25 MG tablet Take 25 mg by mouth daily as needed.     aspirin EC 81 MG tablet Take 1 tablet (81 mg total) by mouth daily.     atenolol (TENORMIN) 50 MG tablet Take 50 mg by mouth at bedtime.      BREO ELLIPTA 100-25 MCG/INH AEPB Inhale 1 puff into the lungs daily.     [START ON 01/12/2022] buPROPion (  WELLBUTRIN XL) 300 MG 24 hr tablet Take 1 tablet (300 mg total) by mouth daily. 30 tablet 2   cetirizine (ZYRTEC) 10 MG tablet Take 10 mg by mouth at bedtime.      Cholecalciferol (VITAMIN D) 2000 units tablet Take 2,000 Units by mouth daily.     eszopiclone (LUNESTA) 1 MG TABS tablet Take 1 tablet (1 mg total) by mouth at bedtime as needed for sleep. Take immediately before bedtime 30 tablet 0   lisinopril-hydrochlorothiazide (PRINZIDE,ZESTORETIC) 20-25 MG tablet Take 1 tablet by mouth daily.     meloxicam (MOBIC) 7.5 MG tablet Take 7.5 mg by mouth daily.     Multiple Vitamin (MULTIVITAMIN) tablet Take 1 tablet by mouth daily.     NOVOLOG 100 UNIT/ML injection Inject into the skin continuous. Via Insulin pump     omeprazole (PRILOSEC) 40 MG capsule Take 40 mg by mouth daily.     pravastatin (PRAVACHOL) 80 MG tablet Take 80 mg by mouth at bedtime.      sertraline (ZOLOFT) 100 MG tablet Take 1.5 tablets (150 mg total) by mouth daily. 45 tablet 3   traZODone (DESYREL) 100 MG tablet Take 1 tablet (100 mg total) by mouth at bedtime as needed. for sleep 30 tablet 3   No current facility-administered medications for this visit.     Musculoskeletal: Strength & Muscle Tone:  N/A Gait & Station:  N/A Patient leans: N/A  Psychiatric  Specialty Exam: Review of Systems  Psychiatric/Behavioral: Negative.    All other systems reviewed and are negative.  There were no vitals taken for this visit.There is no height or weight on file to calculate BMI.  General Appearance: NA  Eye Contact:  NA  Speech:  Clear and Coherent  Volume:  Normal  Mood:   good  Affect:  NA  Thought Process:  Coherent  Orientation:  Full (Time, Place, and Person)  Thought Content: Logical   Suicidal Thoughts:  No  Homicidal Thoughts:  No  Memory:  Immediate;   Good  Judgement:  Good  Insight:  Good  Psychomotor Activity:  Normal  Concentration:  Concentration: Good and Attention Span: Good  Recall:  Good  Fund of Knowledge: Good  Language: Good  Akathisia:  No  Handed:  Right  AIMS (if indicated): not done  Assets:  Communication Skills Desire for Improvement  ADL's:  Intact  Cognition: WNL  Sleep:  Good   Screenings: PHQ2-9    Flowsheet Row Video Visit from 12/17/2021 in [place] Video Visit from 08/07/2021 in [place] Video Visit from 05/14/2021 in [place] Video Visit from 02/11/2021 in [place]  PHQ-2 Total Score 0 0 0 1      Flowsheet Row Admission (Discharged) from 04/16/2021 in [place] [facility] from 04/15/2021 in [place] Admission (Discharged) from 02/27/2021 in [place] No Risk Error: Question 6 not populated No Risk        Assessment and Plan:  TAM DELISLE is a 66 y.o. year old male with a history of depression, anxiety,  diabetes, hyperlipidemia, severe sleep apnea (will have BiPAP), who presents for follow up appointment for below.   1. MDD (major depressive disorder), recurrent, in full remission ([place]) He denies any mood symptoms since the last visit.  Psychosocial stressors includes demoralization due to  diabetes and unemployment.  He engages in daily activities, going to church, and reports good  relationship with his wife.  Although it has been discussed to try tapering down some of the medication (on the current same medication regimen without symptoms since April 2021), she prefers to stay the same.  Will continue sertraline, bupropion as maintenance treatment for depression.  Will continue hydroxyzine as needed for anxiety. Will hold Trazodone given he is prescribed Lunesta from his neurologist.   Plan  1. Continue sertraline 150 mg daily  2. Continue bupropion 300 mg daily  3. Discontinue Trazodone (was on 100 mg) 4. Continue hydroxyzine 25 mg daily as needed for anxiety- he declined refill 5. Next appointment:  5/10 at 8:20 for 20 mins, phone - on Lunesta 1 mg qhsprn for insomnia, prescribed by neurologist - TSH checked according to his wife a few weeks ago; wnl   Past trials of medication: ?lexapro, venlafaxine, Abilify   The patient demonstrates the following risk factors for suicide: Chronic risk factors for suicide include: psychiatric disorder of depression. Acute risk factors for suicide include: family or marital conflict and loss (financial, interpersonal, professional). Protective factors for this patient include: coping skills and hope for the future. Considering these factors, the overall suicide risk at this point appears to be low. Patient is appropriate for outpatient follow up.  Norman Clay, MD 12/17/2021, 9:57 AM

## 2021-12-16 ENCOUNTER — Telehealth: Payer: Self-pay | Admitting: Neurology

## 2021-12-16 DIAGNOSIS — E1065 Type 1 diabetes mellitus with hyperglycemia: Secondary | ICD-10-CM | POA: Diagnosis not present

## 2021-12-16 NOTE — Telephone Encounter (Signed)
-----   Message from Larey Seat, MD sent at 12/14/2021  4:13 PM EST ----- Cc Dr Woody Seller, MD Treatment Emergent Central apnea, 1. Central Sleep Apnea under CPAP did not respond to CPAP between 13 and 17 cm water pressure but were reduce to single digits under BiPAP with a 4 cm water spread- the best numeric result for sleep duration and low AHI was seen at 23/19 cm water.  1. The patient was fitted with a F 20 model in large, a FFM by [employer]. 2. Sleep Related Hypoxemia was seen for 25 minutes, Nadir at 84%. This also resolved under BiPAP therapy.   PLANS/RECOMMENDATIONS: 1. BiPAP therapy will be initiated with auto BIPAP, 4 cm water pressure spread and beginning at 14/10 cm water through 24/20 cm water. BiPAP compliance is defined as 4 hours or more of nightly use. 2. The patient was left with many spontaneous arousals after BiPAP had been able to reduce the central apnea. I will prescribe a sleep aid for him, temporarily.      DISCUSSION: I prescribed Lunesta at 1 mg po at night for the first 28 days of therapy. A follow up appointment will be scheduled in the Sleep Clinic at [facility].   Please call [phone] with any questions

## 2021-12-16 NOTE — Telephone Encounter (Signed)
I called pt and spoke with his wife. I advised pt that Dr. Brett Fairy reviewed their sleep study results and found that pt tolerated the BiPAP well. Dr. Brett Fairy recommends that pt starts auto BiPAP. I reviewed PAP compliance expectations with the pt. Pt is agreeable to starting a BiPAP. I advised pt that an order will be sent to a DME, [employer], and [place]  will call the pt within about one week after they file with the pt's insurance. [employer] will show the pt how to use the machine, fit for masks, and troubleshoot the BiPAP if needed. A follow up appt was made for insurance purposes with Dr. Brett Fairy April 19,2023 at 8:30 am. Pt verbalized understanding to arrive 15 minutes early and bring their CPAP. A letter with all of this information in it will be mailed to the pt as a reminder. I verified with the pt that the address we have on file is correct. Pt verbalized understanding of results. Pt had no questions at this time but was encouraged to call back if questions arise. I have sent the order to [employer] and have received confirmation that they have received the order.

## 2021-12-17 ENCOUNTER — Other Ambulatory Visit: Payer: Self-pay

## 2021-12-17 ENCOUNTER — Encounter: Payer: Self-pay | Admitting: Psychiatry

## 2021-12-17 ENCOUNTER — Telehealth (INDEPENDENT_AMBULATORY_CARE_PROVIDER_SITE_OTHER): Payer: Medicare PPO | Admitting: Psychiatry

## 2021-12-17 DIAGNOSIS — G4700 Insomnia, unspecified: Secondary | ICD-10-CM

## 2021-12-17 DIAGNOSIS — F3342 Major depressive disorder, recurrent, in full remission: Secondary | ICD-10-CM

## 2021-12-17 MED ORDER — BUPROPION HCL ER (XL) 300 MG PO TB24: 300.0000 mg | ORAL_TABLET | Freq: Every day | ORAL | 2 refills | Status: DC

## 2021-12-17 NOTE — Patient Instructions (Addendum)
1. Continue sertraline 150 mg daily  2. Continue bupropion 300 mg daily  3. Continue Abilify 2 mg daily   4. Continue Trazodone 100 mg at night  5. Continue hydroxyzine 25 mg daily as needed for anxiety 6. Next appointment: 5/10 at  8:20, phone

## 2021-12-21 DIAGNOSIS — E1065 Type 1 diabetes mellitus with hyperglycemia: Secondary | ICD-10-CM | POA: Diagnosis not present

## 2021-12-26 ENCOUNTER — Telehealth: Payer: Self-pay

## 2021-12-26 ENCOUNTER — Telehealth: Payer: Self-pay | Admitting: Neurology

## 2021-12-26 NOTE — Telephone Encounter (Signed)
Wife will reach back out to the ordering MD for the trazodone

## 2021-12-26 NOTE — Telephone Encounter (Signed)
Called the pt wife back and advised that we highly recommend that he stays on the RESMED brand machine. I have sent a email to their management  team to see if they can get him taken care of at another store since the [place] one is out of BiPAP. She was appreciative for the call back.

## 2021-12-26 NOTE — Telephone Encounter (Signed)
Pt's wife called again wanting to know if the pt can get off the [facility] and get back on the Trazadone due to the pt not being able to sleep since he started on the Lunesta. Please advise.

## 2021-12-26 NOTE — Telephone Encounter (Signed)
Called the wife back and advised that Dr Brett Fairy prescribed the [place] as a temporary treatment to get him established with new machine. Advised that if the med is not helping then it would be ok to stop that and discuss restarting the trazodone

## 2021-12-26 NOTE — Telephone Encounter (Signed)
Pt's wife called stating that the DME is wanting to know if it is ok for the pt to get the Luna cpap machine instead of the [employer] machine. Please advise.

## 2021-12-26 NOTE — Telephone Encounter (Signed)
He can restart trazodone 100 mg at night as needed for sleep. I believe there are still refills at the pharmacy. Please let me know if any issues with this.

## 2021-12-26 NOTE — Telephone Encounter (Signed)
left a message that the sleep apnea provider stopped the lunesta and wants him to go back on the trazodone.

## 2021-12-27 NOTE — Telephone Encounter (Signed)
pt wife called back message was given about the trazodone.

## 2021-12-29 DIAGNOSIS — G4733 Obstructive sleep apnea (adult) (pediatric): Secondary | ICD-10-CM | POA: Diagnosis not present

## 2022-01-09 NOTE — Progress Notes (Signed)
Patient needs a copy by mail.  PLANS/RECOMMENDATIONS:  BiPAP therapy will be initiated with auto BIPAP, 4 cm water  pressure spread and beginning at 14/10 cm water through 24/20 cm  water. BiPAP compliance is defined as 4 hours or more of nightly  use.  The patient was left with many spontaneous arousals after BiPAP  had been able to reduce the central apnea. I will prescribe a  sleep aid for him, temporarily.     DISCUSSION: I prescribed Lunesta at 1 mg po at night

## 2022-01-21 DIAGNOSIS — E1065 Type 1 diabetes mellitus with hyperglycemia: Secondary | ICD-10-CM | POA: Diagnosis not present

## 2022-01-27 DIAGNOSIS — G4733 Obstructive sleep apnea (adult) (pediatric): Secondary | ICD-10-CM | POA: Diagnosis not present

## 2022-01-31 DIAGNOSIS — Z299 Encounter for prophylactic measures, unspecified: Secondary | ICD-10-CM | POA: Diagnosis not present

## 2022-01-31 DIAGNOSIS — E1165 Type 2 diabetes mellitus with hyperglycemia: Secondary | ICD-10-CM | POA: Diagnosis not present

## 2022-01-31 DIAGNOSIS — J449 Chronic obstructive pulmonary disease, unspecified: Secondary | ICD-10-CM | POA: Diagnosis not present

## 2022-01-31 DIAGNOSIS — J069 Acute upper respiratory infection, unspecified: Secondary | ICD-10-CM | POA: Diagnosis not present

## 2022-02-12 DIAGNOSIS — Z9641 Presence of insulin pump (external) (internal): Secondary | ICD-10-CM | POA: Diagnosis not present

## 2022-02-12 DIAGNOSIS — E669 Obesity, unspecified: Secondary | ICD-10-CM | POA: Diagnosis not present

## 2022-02-12 DIAGNOSIS — E785 Hyperlipidemia, unspecified: Secondary | ICD-10-CM | POA: Diagnosis not present

## 2022-02-12 DIAGNOSIS — E1065 Type 1 diabetes mellitus with hyperglycemia: Secondary | ICD-10-CM | POA: Diagnosis not present

## 2022-02-12 DIAGNOSIS — Z978 Presence of other specified devices: Secondary | ICD-10-CM | POA: Diagnosis not present

## 2022-02-13 ENCOUNTER — Telehealth: Payer: Self-pay | Admitting: Neurology

## 2022-02-13 NOTE — Telephone Encounter (Signed)
Pt's wife, Karin Griffith, will not be able to come 03/25/22 appt. Would like a call from the nurse to discuss a work in appt to remain compliance for insurance purposes. ?

## 2022-02-13 NOTE — Telephone Encounter (Signed)
Called and LVM for wife to call back. ? ?Needing to know when he was set up with BIPAP machine? Needs 61-90 day f/u from set up date. Can r/s w/ NP if able as well.  ?

## 2022-02-13 NOTE — Telephone Encounter (Signed)
I spoke with [employer] and the patient was set up 01/01/2022 through [employer] with a [employer]. ? ?**If pt or wife calls back, pt will need to be seen 03/02/2022-03/31/2022 for insurance purposes. His machine is tagged and can be read and pt can be a virtual visit if that is better. Also can be with NP or MD.  ?

## 2022-02-14 NOTE — Telephone Encounter (Signed)
Pt had appt set up in [place] and also in may ?

## 2022-02-14 NOTE — Telephone Encounter (Signed)
LVM to reschedule;appt for 02/17/22 has been cancelled, call the office to reschedule appt. ?

## 2022-02-17 ENCOUNTER — Encounter: Payer: Medicare PPO | Admitting: Adult Health

## 2022-02-17 DIAGNOSIS — E1065 Type 1 diabetes mellitus with hyperglycemia: Secondary | ICD-10-CM | POA: Diagnosis not present

## 2022-02-17 NOTE — Telephone Encounter (Signed)
Have rescheduled initial CPAP for 03/21/22 at 10:15 am with Jinny Blossom, NP ?

## 2022-02-18 DIAGNOSIS — E1065 Type 1 diabetes mellitus with hyperglycemia: Secondary | ICD-10-CM | POA: Diagnosis not present

## 2022-02-27 DIAGNOSIS — G4733 Obstructive sleep apnea (adult) (pediatric): Secondary | ICD-10-CM | POA: Diagnosis not present

## 2022-03-12 ENCOUNTER — Ambulatory Visit: Payer: Medicare PPO | Admitting: Neurology

## 2022-03-21 ENCOUNTER — Telehealth: Payer: Medicare PPO | Admitting: Adult Health

## 2022-03-21 DIAGNOSIS — G4733 Obstructive sleep apnea (adult) (pediatric): Secondary | ICD-10-CM | POA: Diagnosis not present

## 2022-03-21 DIAGNOSIS — E1065 Type 1 diabetes mellitus with hyperglycemia: Secondary | ICD-10-CM | POA: Diagnosis not present

## 2022-03-21 NOTE — Progress Notes (Signed)
?  [facility] ?[address] ?[place]. [place] [address] ?[phone] ? ?PRIMARY NEUROLOGIST:  ? ? ?Virtual Visit via Telephone Note ? ?I connected with Paul Webb on 03/21/22 at 10:15 AM EDT by telephone located remotely at [facility] and verified that I am speaking with the correct person using two identifiers who reports being located at home.  ?  ?Visit scheduled by me. Idiscussed the limitations, risks, security and privacy concerns of performing an evaluation and management service by telephone and the availability of in person appointments. I also discussed with the patient that there may be a patient responsible charge related to this service. The patient expressed understanding and agreed to proceed. See telephone note for consent and additional scheduling information.  ? ? ?History of Present Illness: ? ?Paul Webb is a 66 y.o. male with a history of obstructive sleep apnea on BiPAP.  He received a new machine.  We tried to connect through video visit but was unable to get his microphone to work.  Reports that the BiPAP is working well for him.  Download is below.  He does report that the straps are causing a rash.  Has not discussed with his DME company ? ? ? ?  ?Observations/Objective: ? ?Generalized: Well developed, in no acute distress  ? ?Neurological examination  ?Mentation: Alert oriented to time, place, history taking. Follows all commands speech and language fluent ? ?Assessment and Plan: ? ?1: OSA on BIPAP ? ?Good compliance ?Residual AHI has decreased to 8 events an hour this is improved from when he was on CPAP ?Encouraged him to continue using BiPAP nightly and greater than 4 hours each night ? ? ?Follow Up Instructions: ? ? F/U in 1 year  ? ? ? ?I discussed the assessment and treatment plan with the patient.  The patient was provided an opportunity to ask questions and all were answered to their satisfaction. The patient agreed with the plan and  verbalized an understanding of the instructions. ?  ?I provided 10 minutes of non-face-to-face time during this encounter. ? ? ? ?Ward Givens NP-C ? ?[facility] ?[place], [place] [address] ? ?Phone [phone] Fax [phone] ?\ ? ? ? ? ?

## 2022-03-25 ENCOUNTER — Ambulatory Visit: Payer: Medicare PPO | Admitting: Neurology

## 2022-03-25 NOTE — Progress Notes (Signed)
Denyse Amass, RN; Vanessa Ralphs ?got it   ? ?  ?Previous Messages ?  ?----- Message -----  ?From: Brandon Melnick, RN  ?Sent: 03/24/2022   5:24 PM EDT  ?To: Ocie Bob  ? ?New order in Epic.  Mask refitting.    ? ?Alden Server. Ishmael Holter "Lanny Hurst"  ?Male, 66 y.o., [birth year]  ?MRN:  ?[id number]  ?Thank you.    ? ?Sandy RN  ? ?

## 2022-03-29 DIAGNOSIS — G4733 Obstructive sleep apnea (adult) (pediatric): Secondary | ICD-10-CM | POA: Diagnosis not present

## 2022-03-31 ENCOUNTER — Other Ambulatory Visit: Payer: Self-pay | Admitting: Psychiatry

## 2022-03-31 NOTE — Progress Notes (Signed)
Virtual Visit via Telephone Note ? ?I connected with Paul Webb on 04/02/22 at  8:20 AM EDT by telephone and verified that I am speaking with the correct person using two identifiers. ? ?Location: ?Patient: home ?Provider: office ?Persons participated in the visit- patient, provider  ?  ?I discussed the limitations, risks, security and privacy concerns of performing an evaluation and management service by telephone and the availability of in person appointments. I also discussed with the patient that there may be a patient responsible charge related to this service. The patient expressed understanding and agreed to proceed. ?  ?I discussed the assessment and treatment plan with the patient. The patient was provided an opportunity to ask questions and all were answered. The patient agreed with the plan and demonstrated an understanding of the instructions. ?  ?The patient was advised to call back or seek an in-person evaluation if the symptoms worsen or if the condition fails to improve as anticipated. ? ?I provided 11 minutes of non-face-to-face time during this encounter. ? ? ?Norman Clay, MD ? ? ? ? ?BH MD/PA/NP OP Progress Note ? ?04/02/2022 8:40 AM ?Paul Webb  ?MRN:  [id number] ? ?Chief Complaint:  ?Chief Complaint  ?Patient presents with  ? Follow-up  ? Depression  ? ?HPI:  ?- Trazodone 100 mg was restarted since the last visit ? ?This is a follow-up appointment for depression and insomnia.  ?He states that he has been doing good.  Everything is right.  He enjoyed going to the beach the other day.  He has been working on the house and mowing.  He goes to church a few times per week.  Although there are some times he feels anxious, it has become less.  He sleeps fine most of the time.  He uses CPAP machine now.  Although he does not always feels refreshed in the morning, it has been manageable.  He reports increasing in appetite, and has gained weight.  He has been working on his diet.  He denies feeling  depressed or anhedonia.  He denies SI.  He is not taking Lunesta anymore.  He is willing to come to [facility] for a follow-up appointment given he lives in [place].  ? ?235 lbs ?Wt Readings from Last 3 Encounters:  ?10/03/21 219 lb 8 oz (99.6 kg)  ?08/28/21 213 lb (96.6 kg)  ?05/08/21 216 lb (98 kg)  ?  ? ?Visit Diagnosis:  ?  ICD-10-CM   ?1. MDD (major depressive disorder), recurrent, in full remission ([place])  F33.42   ?  ?2. Insomnia, unspecified type  G47.00   ?  ? ? ?Past Psychiatric History: Please see initial evaluation for full details. I have reviewed the history. No updates at this time.  ?  ? ?Past Medical History:  ?Past Medical History:  ?Diagnosis Date  ? Arthritis   ? 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 Essential hypertension 07/30/2012  ? Fast heart beat   ? Gastro-esophageal reflux disease with esophagitis 07/30/2012  ? Generalized anxiety disorder 05/12/2017  ? GERD (gastroesophageal reflux disease)   ? History of kidney stones   ? Hyperlipidemia 07/30/2012  ? Major depressive disorder   ? OSA (obstructive sleep apnea)   ? does not use CPAP  ? Type 2 diabetes mellitus 07/30/2012  ?  ?Past Surgical History:  ?Procedure Laterality Date  ? COLONOSCOPY    ? COLONOSCOPY N/A 10/06/2018  ? Procedure: COLONOSCOPY;  Surgeon: Rogene Houston, MD;  [facility];  Service: Endoscopy;  Laterality: N/A;  1:25  ? DRUG INDUCED ENDOSCOPY N/A 02/27/2021  ? Procedure: DRUG INDUCED ENDOSCOPY;  Surgeon: Jerrell Belfast, MD;  Location: [facility];  Service: ENT;  Laterality: N/A;  ? FL INJ RT KNEE CT ARTHROGRAM ([facility] HX)    ? NASAL SEPTOPLASTY W/ TURBINOPLASTY Bilateral 02/27/2021  ? Procedure: NASAL SEPTOPLASTY WITH TURBINATE REDUCTION;  Surgeon: Jerrell Belfast, MD;  Location: [facility];  Service: ENT;  Laterality: Bilateral;  ? POLYPECTOMY  10/06/2018  ? Procedure: POLYPECTOMY;  Surgeon: Rogene Houston, MD;  [facility];  Service: Endoscopy;;  colon ?  ?  Rod in lower leg    ? trauma  ? Rt hernia repair    ? SHOULDER ARTHROSCOPY Right 04/16/2021  ? Procedure: RIGHT SHOULDER ARTHOROSCOPY AND DEBRIDEMENT;  Surgeon: Carole Civil, MD;  [facility];  Service: Orthopedics;  Laterality: Right;  ? ? ?Family Psychiatric History: Please see initial evaluation for full details. I have reviewed the history. No updates at this time.  ?  ? ?Family History:  ?Family History  ?Adopted: Yes  ?Family history unknown: Yes  ? ? ?Social History:  ?Social History  ? ?Socioeconomic History  ? Marital status: Married  ?  Spouse name: Santiago Glad  ? Number of children: Not on file  ? Years of education: 39  ? Highest education level: High school graduate  ?Occupational History  ? Occupation: Retired  ?Tobacco Use  ? Smoking status: Never  ? Smokeless tobacco: Never  ?Vaping Use  ? Vaping Use: Never used  ?Substance and Sexual Activity  ? Alcohol use: Never  ? Drug use: Never  ? Sexual activity: Not on file  ?Other Topics Concern  ? Not on file  ?Social History Narrative  ? Right Handed  ? Lives in one story with a basement   ? Drinks Caffeine   ? ?Social Determinants of Health  ? ?Financial Resource Strain: Not on file  ?Food Insecurity: Not on file  ?Transportation Needs: Not on file  ?Physical Activity: Not on file  ?Stress: Not on file  ?Social Connections: Not on file  ? ? ?Allergies: No Known Allergies ? ?Metabolic Disorder Labs: ?Lab Results  ?Component Value Date  ? HGBA1C 6.4 (H) 04/15/2021  ? MPG 136.98 04/15/2021  ? ?No results found for: PROLACTIN ?No results found for: CHOL, TRIG, HDL, CHOLHDL, VLDL, LDLCALC ?No results found for: TSH ? ?Therapeutic Level Labs: ?No results found for: LITHIUM ?No results found for: VALPROATE ?No components found for:  CBMZ ? ?Current Medications: ?Current Outpatient Medications  ?Medication Sig Dispense Refill  ? aspirin EC 81 MG tablet Take 1 tablet (81 mg total) by mouth daily.    ? atenolol (TENORMIN) 50 MG tablet Take 50 mg by mouth at  bedtime.     ? BREO ELLIPTA 100-25 MCG/INH AEPB Inhale 1 puff into the lungs daily.    ? [START ON 04/12/2022] buPROPion (WELLBUTRIN XL) 300 MG 24 hr tablet Take 1 tablet (300 mg total) by mouth daily. 30 tablet 5  ? cetirizine (ZYRTEC) 10 MG tablet Take 10 mg by mouth at bedtime.     ? Cholecalciferol (VITAMIN D) 2000 units tablet Take 2,000 Units by mouth daily.    ? hydrOXYzine (ATARAX) 25 MG tablet Take 25 mg by mouth daily as needed.    ? lisinopril-hydrochlorothiazide (PRINZIDE,ZESTORETIC) 20-25 MG tablet Take 1 tablet by mouth daily.    ? meloxicam (MOBIC) 7.5 MG tablet Take 7.5  mg by mouth daily.    ? Multiple Vitamin (MULTIVITAMIN) tablet Take 1 tablet by mouth daily.    ? NOVOLOG 100 UNIT/ML injection Inject into the skin continuous. Via Insulin pump    ? omeprazole (PRILOSEC) 40 MG capsule Take 40 mg by mouth daily.    ? pravastatin (PRAVACHOL) 80 MG tablet Take 80 mg by mouth at bedtime.     ? [START ON 04/12/2022] sertraline (ZOLOFT) 100 MG tablet Take 1.5 tablets (150 mg total) by mouth daily. 45 tablet 5  ? traZODone (DESYREL) 100 MG tablet Take 1 tablet (100 mg total) by mouth at bedtime as needed. for sleep 30 tablet 2  ? ?No current facility-administered medications for this visit.  ? ? ? ?Musculoskeletal: ?Strength & Muscle Tone:  N/A ?Gait & Station:  N/A ?Patient leans: N/A ? ?Psychiatric Specialty Exam: ?Review of Systems  ?Psychiatric/Behavioral:  Negative for agitation, behavioral problems, confusion, decreased concentration, dysphoric mood, hallucinations, self-injury, sleep disturbance and suicidal ideas. The patient is nervous/anxious. The patient is not hyperactive.   ?All other systems reviewed and are negative.  ?There were no vitals taken for this visit.There is no height or weight on file to calculate BMI.  ?General Appearance: NA  ?Eye Contact:  NA  ?Speech:  Clear and Coherent  ?Volume:  Normal  ?Mood:   good  ?Affect:  NA  ?Thought Process:  Coherent  ?Orientation:  Full (Time,  Place, and Person)  ?Thought Content: Logical   ?Suicidal Thoughts:  No  ?Homicidal Thoughts:  No  ?Memory:  Immediate;   Good  ?Judgement:  Good  ?Insight:  Good  ?Psychomotor Activity:  Normal  ?Concentrat

## 2022-04-01 DIAGNOSIS — E1165 Type 2 diabetes mellitus with hyperglycemia: Secondary | ICD-10-CM | POA: Diagnosis not present

## 2022-04-01 DIAGNOSIS — M7989 Other specified soft tissue disorders: Secondary | ICD-10-CM | POA: Diagnosis not present

## 2022-04-01 DIAGNOSIS — R600 Localized edema: Secondary | ICD-10-CM | POA: Diagnosis not present

## 2022-04-01 DIAGNOSIS — M7122 Synovial cyst of popliteal space [Baker], left knee: Secondary | ICD-10-CM | POA: Diagnosis not present

## 2022-04-01 DIAGNOSIS — F3342 Major depressive disorder, recurrent, in full remission: Secondary | ICD-10-CM | POA: Diagnosis not present

## 2022-04-01 DIAGNOSIS — Z299 Encounter for prophylactic measures, unspecified: Secondary | ICD-10-CM | POA: Diagnosis not present

## 2022-04-01 DIAGNOSIS — M79605 Pain in left leg: Secondary | ICD-10-CM | POA: Diagnosis not present

## 2022-04-01 DIAGNOSIS — Z6833 Body mass index (BMI) 33.0-33.9, adult: Secondary | ICD-10-CM | POA: Diagnosis not present

## 2022-04-02 ENCOUNTER — Telehealth (INDEPENDENT_AMBULATORY_CARE_PROVIDER_SITE_OTHER): Payer: Medicare PPO | Admitting: Psychiatry

## 2022-04-02 ENCOUNTER — Encounter: Payer: Self-pay | Admitting: Psychiatry

## 2022-04-02 DIAGNOSIS — F3342 Major depressive disorder, recurrent, in full remission: Secondary | ICD-10-CM

## 2022-04-02 DIAGNOSIS — G4700 Insomnia, unspecified: Secondary | ICD-10-CM | POA: Diagnosis not present

## 2022-04-02 MED ORDER — SERTRALINE HCL 100 MG PO TABS: 150.0000 mg | ORAL_TABLET | Freq: Every day | ORAL | 5 refills | Status: DC

## 2022-04-02 MED ORDER — BUPROPION HCL ER (XL) 300 MG PO TB24: 300.0000 mg | ORAL_TABLET | Freq: Every day | ORAL | 5 refills | Status: DC

## 2022-04-02 MED ORDER — TRAZODONE HCL 100 MG PO TABS: 100.0000 mg | ORAL_TABLET | Freq: Every evening | ORAL | 2 refills | Status: DC | PRN

## 2022-04-02 NOTE — Patient Instructions (Signed)
Continue sertraline 150 mg daily  ?Continue bupropion 300 mg daily  ?Continue Trazodone 100 mg at night as needed for insomnia ?Continue hydroxyzine 25 mg daily as needed for anxiety ?Next appointment: 8/7 at 10 AM, in person ? ?The next visit will be in person visit. Please arrive 15 mins before the scheduled time.  ? ?[place]  ?Address: [address], [place], [place] [address]   ?

## 2022-04-15 DIAGNOSIS — B360 Pityriasis versicolor: Secondary | ICD-10-CM | POA: Diagnosis not present

## 2022-04-15 DIAGNOSIS — D239 Other benign neoplasm of skin, unspecified: Secondary | ICD-10-CM | POA: Diagnosis not present

## 2022-04-15 DIAGNOSIS — L281 Prurigo nodularis: Secondary | ICD-10-CM | POA: Diagnosis not present

## 2022-04-15 DIAGNOSIS — D225 Melanocytic nevi of trunk: Secondary | ICD-10-CM | POA: Diagnosis not present

## 2022-04-15 DIAGNOSIS — L578 Other skin changes due to chronic exposure to nonionizing radiation: Secondary | ICD-10-CM | POA: Diagnosis not present

## 2022-04-15 DIAGNOSIS — L821 Other seborrheic keratosis: Secondary | ICD-10-CM | POA: Diagnosis not present

## 2022-04-15 DIAGNOSIS — L814 Other melanin hyperpigmentation: Secondary | ICD-10-CM | POA: Diagnosis not present

## 2022-04-28 DIAGNOSIS — G4733 Obstructive sleep apnea (adult) (pediatric): Secondary | ICD-10-CM | POA: Diagnosis not present

## 2022-05-19 DIAGNOSIS — Z978 Presence of other specified devices: Secondary | ICD-10-CM | POA: Diagnosis not present

## 2022-05-19 DIAGNOSIS — E785 Hyperlipidemia, unspecified: Secondary | ICD-10-CM | POA: Diagnosis not present

## 2022-05-19 DIAGNOSIS — E1065 Type 1 diabetes mellitus with hyperglycemia: Secondary | ICD-10-CM | POA: Diagnosis not present

## 2022-05-19 DIAGNOSIS — Z9641 Presence of insulin pump (external) (internal): Secondary | ICD-10-CM | POA: Diagnosis not present

## 2022-05-28 DIAGNOSIS — G4733 Obstructive sleep apnea (adult) (pediatric): Secondary | ICD-10-CM | POA: Diagnosis not present

## 2022-06-05 DIAGNOSIS — L281 Prurigo nodularis: Secondary | ICD-10-CM | POA: Diagnosis not present

## 2022-06-19 ENCOUNTER — Other Ambulatory Visit: Payer: Self-pay | Admitting: Psychiatry

## 2022-06-19 DIAGNOSIS — E1065 Type 1 diabetes mellitus with hyperglycemia: Secondary | ICD-10-CM | POA: Diagnosis not present

## 2022-06-24 ENCOUNTER — Telehealth: Payer: Self-pay | Admitting: Adult Health

## 2022-06-24 ENCOUNTER — Ambulatory Visit: Payer: Medicare PPO | Admitting: Psychiatry

## 2022-06-24 DIAGNOSIS — G4733 Obstructive sleep apnea (adult) (pediatric): Secondary | ICD-10-CM

## 2022-06-24 NOTE — Telephone Encounter (Signed)
I pulled pt's BiPAP data(see below) and called pt back. He sts over the last month he has notice his oxygen saturations at rest being at 90. He sts this is lower for him and and one point sat read 88. Pt is concerned that his oxygen saturations are dropping even lower when he is sleeping. I advised I would send message to NP. I reviewed notes and could not find where ONO had been completed in the past, we may could consider this.

## 2022-06-24 NOTE — Telephone Encounter (Signed)
Pt is calling and requesting a appointment but after reading Paul Webb) appointment notes I let Pt know that no appointment was needed for a year.Pt is wanting a appointment to discuss oxygen.

## 2022-06-24 NOTE — Telephone Encounter (Signed)
I called pt back spoke with his wife (ok per dpr) and we discussed recommendation from NP. He is amendable to ONO while on BiPAP. I have placed the order.

## 2022-06-24 NOTE — Addendum Note (Signed)
Addended by: Verlin Grills on: 06/24/2022 02:38 PM   Modules accepted: Orders

## 2022-06-24 NOTE — Telephone Encounter (Signed)
Can you do ONO while on CPAP if he is amendable.

## 2022-06-30 ENCOUNTER — Ambulatory Visit: Payer: Medicare PPO | Admitting: Psychiatry

## 2022-07-07 ENCOUNTER — Ambulatory Visit: Payer: Medicare PPO | Admitting: Psychiatry

## 2022-07-08 NOTE — Progress Notes (Unsigned)
BH MD/PA/NP OP Progress Note  07/10/2022 1:24 PM Paul Webb  MRN:  [id number]  Chief Complaint:  Chief Complaint  Patient presents with   Follow-up   HPI:  This is a follow-up appointment for depression and anxiety.  He states that he has been doing well.  He was feeling anxious when he filled out the form as his wife usually takes care of it.  He reports similar anxiety when he was visiting his children in [place] state.  He had a good time with them otherwise.  He reports good relationship with his wife.  He enjoys taking care of 64-year-old grand daughter.  He feels irritable at times when he feels anxious.  He talks about an example of him getting irritable when his daughter teaching the way to get off from highway, although he knows it.  He denies any aggression when he is irritable, and denies any concern otherwise.  He has been active, working on truck.  He attributes his weight gain to increase in appetite.  He has resumed work on his diet especially given he has diabetes.  He sleeps better since being on BiPAP machine. He will have another evaluation for this.  He denies feeling depressed.  He denies SI.  He denies anxiety at the baseline.  He denies panic attacks.  He denies alcohol use or drug use.  He feels comfortable to stay on his current medication regimen.   Wt Readings from Last 3 Encounters:  07/10/22 233 lb 6.4 oz (105.9 kg)  10/03/21 219 lb 8 oz (99.6 kg)  08/28/21 213 lb (96.6 kg)     Visit Diagnosis:    ICD-10-CM   1. MDD (major depressive disorder), recurrent, in full remission ([place])  F33.42     2. Insomnia, unspecified type  G47.00       Past Psychiatric History: Please see initial evaluation for full details. I have reviewed the history. No updates at this time.     Past Medical History:  Past Medical History:  Diagnosis Date   Arthritis    Circadian rhythm sleep disturbance 09/05/2019   Essential hypertension 07/30/2012   Fast heart beat     Gastro-esophageal reflux disease with esophagitis 07/30/2012   Generalized anxiety disorder 05/12/2017   GERD (gastroesophageal reflux disease)    History of kidney stones    Hyperlipidemia 07/30/2012   Major depressive disorder    OSA (obstructive sleep apnea)    does not use CPAP   Type 2 diabetes mellitus 07/30/2012    Past Surgical History:  Procedure Laterality Date   COLONOSCOPY     COLONOSCOPY N/A 10/06/2018   Procedure: COLONOSCOPY;  Surgeon: Rogene Houston, MD;  [facility];  Service: Endoscopy;  Laterality: N/A;  1:25   DRUG INDUCED ENDOSCOPY N/A 02/27/2021   Procedure: DRUG INDUCED ENDOSCOPY;  Surgeon: Jerrell Belfast, MD;  Location: [facility];  Service: ENT;  Laterality: N/A;   FL INJ RT KNEE CT ARTHROGRAM ([facility] HX)     NASAL SEPTOPLASTY W/ TURBINOPLASTY Bilateral 02/27/2021   Procedure: NASAL SEPTOPLASTY WITH TURBINATE REDUCTION;  Surgeon: Jerrell Belfast, MD;  Location: [facility];  Service: ENT;  Laterality: Bilateral;   POLYPECTOMY  10/06/2018   Procedure: POLYPECTOMY;  Surgeon: Rogene Houston, MD;  [facility];  Service: Endoscopy;;  colon    Rod in lower leg     trauma   Rt hernia repair     SHOULDER ARTHROSCOPY Right 04/16/2021  Procedure: RIGHT SHOULDER ARTHOROSCOPY AND DEBRIDEMENT;  Surgeon: Carole Civil, MD;  [facility];  Service: Orthopedics;  Laterality: Right;    Family Psychiatric History: Please see initial evaluation for full details. I have reviewed the history. No updates at this time.     Family History:  Family History  Adopted: Yes  Family history unknown: Yes    Social History:  Social History   Socioeconomic History   Marital status: Married    Spouse name: Santiago Glad   Number of children: Not on file   Years of education: 12   Highest education level: High school graduate  Occupational History   Occupation: Retired  Tobacco Use   Smoking status: Never   Smokeless  tobacco: Never  Scientific laboratory technician Use: Never used  Substance and Sexual Activity   Alcohol use: Never   Drug use: Never   Sexual activity: Not on file  Other Topics Concern   Not on file  Social History Narrative   Right Handed   Lives in one story with a basement    Drinks Caffeine    Social Determinants of Health   Financial Resource Strain: Not on file  Food Insecurity: Not on file  Transportation Needs: Not on file  Physical Activity: Not on file  Stress: Not on file  Social Connections: Not on file    Allergies: No Known Allergies  Metabolic Disorder Labs: Lab Results  Component Value Date   HGBA1C 6.4 (H) 04/15/2021   MPG 136.98 04/15/2021   No results found for: "PROLACTIN" No results found for: "CHOL", "TRIG", "HDL", "CHOLHDL", "VLDL", "LDLCALC" No results found for: "TSH"  Therapeutic Level Labs: No results found for: "LITHIUM" No results found for: "VALPROATE" No results found for: "CBMZ"  Current Medications: Current Outpatient Medications  Medication Sig Dispense Refill   aspirin EC 81 MG tablet Take 1 tablet (81 mg total) by mouth daily.     atenolol (TENORMIN) 50 MG tablet Take 50 mg by mouth at bedtime.      BREO ELLIPTA 100-25 MCG/INH AEPB Inhale 1 puff into the lungs daily.     buPROPion (WELLBUTRIN XL) 300 MG 24 hr tablet Take 1 tablet (300 mg total) by mouth daily. 30 tablet 5   cetirizine (ZYRTEC) 10 MG tablet Take 10 mg by mouth at bedtime.      Cholecalciferol (VITAMIN D) 2000 units tablet Take 2,000 Units by mouth daily.     doxycycline (VIBRAMYCIN) 100 MG capsule Take 100 mg by mouth daily.     hydrOXYzine (ATARAX) 25 MG tablet Take 25 mg by mouth daily as needed.     lisinopril-hydrochlorothiazide (PRINZIDE,ZESTORETIC) 20-25 MG tablet Take 1 tablet by mouth daily.     meloxicam (MOBIC) 7.5 MG tablet Take 7.5 mg by mouth daily.     Multiple Vitamin (MULTIVITAMIN) tablet Take 1 tablet by mouth daily.     NOVOLOG 100 UNIT/ML injection  Inject into the skin.     omeprazole (PRILOSEC) 40 MG capsule Take 40 mg by mouth daily.     pravastatin (PRAVACHOL) 80 MG tablet Take 80 mg by mouth at bedtime.      sertraline (ZOLOFT) 100 MG tablet Take 1.5 tablets (150 mg total) by mouth daily. 45 tablet 5   traZODone (DESYREL) 100 MG tablet Take 1 tablet (100 mg total) by mouth at bedtime as needed. for sleep 30 tablet 3   No current facility-administered medications for this visit.     Musculoskeletal: Strength &  Muscle Tone: within normal limits Gait & Station: normal Patient leans: N/A  Psychiatric Specialty Exam: Review of Systems  Psychiatric/Behavioral:  Negative for agitation, behavioral problems, confusion, decreased concentration, dysphoric mood, hallucinations, self-injury, sleep disturbance and suicidal ideas. The patient is nervous/anxious. The patient is not hyperactive.   All other systems reviewed and are negative.   Blood pressure (!) 142/73, pulse 60, temperature 98.1 F (36.7 C), temperature source Temporal, weight 233 lb 6.4 oz (105.9 kg).Body mass index is 32.55 kg/m.  General Appearance: Fairly Groomed  Eye Contact:  Good  Speech:  Clear and Coherent  Volume:  Normal  Mood:  Anxious  Affect:  Appropriate, Congruent, and slightly tense, smiles  Thought Process:  Coherent  Orientation:  Full (Time, Place, and Person)  Thought Content: Logical   Suicidal Thoughts:  No  Homicidal Thoughts:  No  Memory:  Immediate;   Good  Judgement:  Good  Insight:  Good  Psychomotor Activity:  Normal  Concentration:  Concentration: Good and Attention Span: Good  Recall:  Good  Fund of Knowledge: Good  Language: Good  Akathisia:  No  Handed:  Right  AIMS (if indicated): not done  Assets:  Communication Skills Desire for Improvement  ADL's:  Intact  Cognition: WNL  Sleep:  Good   Screenings: GAD-7    Flowsheet Row Office Visit from 07/10/2022 in [facility]  Total GAD-7 Score 12       PHQ2-9    [facility] Office Visit from 07/10/2022 in [facility] Video Visit from 12/17/2021 in [facility] Video Visit from 08/07/2021 in [facility] Video Visit from 05/14/2021 in [facility] Video Visit from 02/11/2021 in [facility]  PHQ-2 Total Score 0 0 0 0 1      Flowsheet Row Admission (Discharged) from 04/16/2021 in [facility] from 04/15/2021 in [facility] Admission (Discharged) from 02/27/2021 in [facility] No Risk Error: Question 6 not populated No Risk        Assessment and Plan:  Paul Webb is a 66 y.o. year old male with a history of depression, anxiety,  diabetes, hyperlipidemia, severe sleep apnea (on BiPAP), who presents for follow up appointment for below.     1. MDD (major depressive disorder), recurrent, in full remission ([place]) He denies any significant mood symptoms except situational anxiety/irritability since the last visit. Psychosocial stressors includes demoralization due to diabetes and unemployment.  He engages in daily activities, going to church, and reports good relationship with his wife.  Will continue saturating and bupropion as maintenance treatment for depression (he has been on the current same medication regimen without symptoms since April 2021).  Will continue hydroxyzine as needed for anxiety.   2. Insomnia, unspecified type Improving.  He has started to use a BiPAP machine.  Will continue current dose of trazodone as needed for insomnia.     Plan Continue sertraline 150 mg daily  Continue bupropion 300 mg daily  Continue Trazodone 100 mg at night as needed for insomnia Continue hydroxyzine 25 mg daily as needed for anxiety- he declined refill Next appointment: 11/13 at 1 PM for 30 mins, in person.  He prefers to  be transferred to [place] office if possible.  - TSH checked according to his wife a few weeks ago; wnl   Past trials of medication: ?lexapro, venlafaxine, Abilify   The patient demonstrates the following  risk factors for suicide: Chronic risk factors for suicide include: psychiatric disorder of depression. Acute risk factors for suicide include: family or marital conflict and loss (financial, interpersonal, professional). Protective factors for this patient include: coping skills and hope for the future. Considering these factors, the overall suicide risk at this point appears to be low. Patient is appropriate for outpatient follow up.    Collaboration of Care: Collaboration of Care: Other N/A  Patient/Guardian was advised Release of Information must be obtained prior to any record release in order to collaborate their care with an outside provider. Patient/Guardian was advised if they have not already done so to contact the registration department to sign all necessary forms in order for [place] to release information regarding their care.   Consent: Patient/Guardian gives verbal consent for treatment and assignment of benefits for services provided during this visit. Patient/Guardian expressed understanding and agreed to proceed.    Norman Clay, MD 07/10/2022, 1:24 PM

## 2022-07-10 ENCOUNTER — Ambulatory Visit (INDEPENDENT_AMBULATORY_CARE_PROVIDER_SITE_OTHER): Payer: Medicare PPO | Admitting: Psychiatry

## 2022-07-10 ENCOUNTER — Encounter: Payer: Self-pay | Admitting: Psychiatry

## 2022-07-10 VITALS — BP 142/73 | HR 60 | Temp 98.1°F | Wt 233.4 lb

## 2022-07-10 DIAGNOSIS — G4700 Insomnia, unspecified: Secondary | ICD-10-CM

## 2022-07-10 DIAGNOSIS — F3342 Major depressive disorder, recurrent, in full remission: Secondary | ICD-10-CM

## 2022-07-10 NOTE — Patient Instructions (Signed)
Continue sertraline 150 mg daily  Continue bupropion 300 mg daily  Continue Trazodone 100 mg at night as needed for insomnia Continue hydroxyzine 25 mg daily as needed for anxiety Next appointment: 11/13 at 1 PM

## 2022-07-16 ENCOUNTER — Telehealth: Payer: Self-pay | Admitting: Adult Health

## 2022-07-16 NOTE — Telephone Encounter (Signed)
Pt's wife called wanting to know when the pt will be scheduled for his overnight sleep study. Please advise.

## 2022-07-17 NOTE — Telephone Encounter (Signed)
Spoke with Judson Roch @ [employer] in [place]. She states order was received and is still being processed. She will reach out directly to the team working on this to get patient scheduled asap. I called the wife back (she is no longer on DPR but the main # on chart is the one they both use). She gave me another number for the pt [phone] and I called him and provided update. Also let him know if he would like to put wife on DPR he can do that next time he is at our office. He was very appreciative and will watch for a call from [facility]. He will follow-up with our office if he doesn't hear soon.

## 2022-07-17 NOTE — Telephone Encounter (Signed)
Urgent message sent to [employer]. Will call later as well. Their office isn't open yet. Order was placed 8/1.

## 2022-07-17 NOTE — Telephone Encounter (Signed)
This is regarding an ONO that was ordered. We will follow-up with the DME company.

## 2022-07-21 DIAGNOSIS — D485 Neoplasm of uncertain behavior of skin: Secondary | ICD-10-CM | POA: Diagnosis not present

## 2022-07-21 DIAGNOSIS — D2361 Other benign neoplasm of skin of right upper limb, including shoulder: Secondary | ICD-10-CM | POA: Diagnosis not present

## 2022-07-21 DIAGNOSIS — L281 Prurigo nodularis: Secondary | ICD-10-CM | POA: Diagnosis not present

## 2022-07-21 NOTE — Telephone Encounter (Signed)
[place], Paul Webb  [place], [place], RN I spoke to the branch that has this ONO order in [place]. She said they have left the patient a vm they just need him to call them back.

## 2022-07-30 DIAGNOSIS — G4733 Obstructive sleep apnea (adult) (pediatric): Secondary | ICD-10-CM | POA: Diagnosis not present

## 2022-08-04 DIAGNOSIS — E785 Hyperlipidemia, unspecified: Secondary | ICD-10-CM | POA: Diagnosis not present

## 2022-08-04 DIAGNOSIS — E1065 Type 1 diabetes mellitus with hyperglycemia: Secondary | ICD-10-CM | POA: Diagnosis not present

## 2022-08-12 DIAGNOSIS — R0683 Snoring: Secondary | ICD-10-CM | POA: Diagnosis not present

## 2022-08-12 DIAGNOSIS — G4730 Sleep apnea, unspecified: Secondary | ICD-10-CM | POA: Diagnosis not present

## 2022-08-19 DIAGNOSIS — E1065 Type 1 diabetes mellitus with hyperglycemia: Secondary | ICD-10-CM | POA: Diagnosis not present

## 2022-08-25 DIAGNOSIS — Z125 Encounter for screening for malignant neoplasm of prostate: Secondary | ICD-10-CM | POA: Diagnosis not present

## 2022-08-25 DIAGNOSIS — R5383 Other fatigue: Secondary | ICD-10-CM | POA: Diagnosis not present

## 2022-08-25 DIAGNOSIS — Z6833 Body mass index (BMI) 33.0-33.9, adult: Secondary | ICD-10-CM | POA: Diagnosis not present

## 2022-08-25 DIAGNOSIS — E6609 Other obesity due to excess calories: Secondary | ICD-10-CM | POA: Diagnosis not present

## 2022-08-25 DIAGNOSIS — Z1331 Encounter for screening for depression: Secondary | ICD-10-CM | POA: Diagnosis not present

## 2022-08-25 DIAGNOSIS — Z79899 Other long term (current) drug therapy: Secondary | ICD-10-CM | POA: Diagnosis not present

## 2022-08-25 DIAGNOSIS — Z1339 Encounter for screening examination for other mental health and behavioral disorders: Secondary | ICD-10-CM | POA: Diagnosis not present

## 2022-08-25 DIAGNOSIS — Z7189 Other specified counseling: Secondary | ICD-10-CM | POA: Diagnosis not present

## 2022-08-25 DIAGNOSIS — Z299 Encounter for prophylactic measures, unspecified: Secondary | ICD-10-CM | POA: Diagnosis not present

## 2022-08-25 DIAGNOSIS — Z0000 Encounter for general adult medical examination without abnormal findings: Secondary | ICD-10-CM | POA: Diagnosis not present

## 2022-08-25 DIAGNOSIS — Z23 Encounter for immunization: Secondary | ICD-10-CM | POA: Diagnosis not present

## 2022-08-25 DIAGNOSIS — E7800 Pure hypercholesterolemia, unspecified: Secondary | ICD-10-CM | POA: Diagnosis not present

## 2022-09-01 DIAGNOSIS — L820 Inflamed seborrheic keratosis: Secondary | ICD-10-CM | POA: Diagnosis not present

## 2022-09-17 ENCOUNTER — Telehealth: Payer: Self-pay | Admitting: *Deleted

## 2022-09-17 DIAGNOSIS — G4733 Obstructive sleep apnea (adult) (pediatric): Secondary | ICD-10-CM

## 2022-09-17 DIAGNOSIS — Z299 Encounter for prophylactic measures, unspecified: Secondary | ICD-10-CM | POA: Diagnosis not present

## 2022-09-17 DIAGNOSIS — R0981 Nasal congestion: Secondary | ICD-10-CM | POA: Diagnosis not present

## 2022-09-17 DIAGNOSIS — R0902 Hypoxemia: Secondary | ICD-10-CM

## 2022-09-17 DIAGNOSIS — J069 Acute upper respiratory infection, unspecified: Secondary | ICD-10-CM | POA: Diagnosis not present

## 2022-09-17 NOTE — Telephone Encounter (Signed)
Spoke with Jinny Blossom NP regarding ONO results from 08/12/22 which showed SpO2 </+ 89% for 3 hr 11 min 34 sec. Recommend Bipap titration study. Pt's most recent download shows CA at 13.6 and AHI at 15.5. spoke with pt and he is amenable to proceeding with a repeat Bipap titration study as he may possibly need another type of therapy setting and may need oxygen bled into the machine. He will await a call from our office but will check back in 2 weeks if he hasn't heard yet. His wife also had a few questions which I answered. She states patient is sick right with respiratory symptoms. He will see his doctor today. It has been difficult to wear Bipap due to coughing and blowing nose. He didn't wear it last night at all and the night before was only 4 hours. She will encourage him to try to wear it as much as he can during this time. She was not on his DPR but he gave me permission to speak with her.   Bipap titration study order placed.

## 2022-09-19 DIAGNOSIS — E1065 Type 1 diabetes mellitus with hyperglycemia: Secondary | ICD-10-CM | POA: Diagnosis not present

## 2022-09-21 ENCOUNTER — Other Ambulatory Visit: Payer: Self-pay | Admitting: Psychiatry

## 2022-09-30 ENCOUNTER — Telehealth: Payer: Self-pay | Admitting: Adult Health

## 2022-09-30 ENCOUNTER — Other Ambulatory Visit: Payer: Self-pay | Admitting: Psychiatry

## 2022-09-30 NOTE — Telephone Encounter (Signed)
BipapMcarthur Rossetti Josem Kaufmann: [id number] (exp. 09/18/22 to 12/17/22)  Patient is scheduled at [facility] for 12/01/22 at 9 pm.  Mailed packet to the patient.

## 2022-10-01 NOTE — Progress Notes (Signed)
[place] MD/PA/NP OP Progress Note  10/06/2022 1:37 PM Paul Webb  MRN:  [id number]  Chief Complaint:  Chief Complaint  Patient presents with   Follow-up   Medication Refill   HPI:  This is a follow-up appointment for depression and insomnia.  He states that he has been doing good.  He feels good about weight loss since being on Mounjaro.  Although he does have decrease in appetite, he has been able to eat regular meals. He continues to feel nervous about "little thing."  He talks about an example of filling out the paperwork today. He also felt anxious driving to the office, and GPS did not work. He finds himself on edge.  He denies having any particular thoughts when he feels anxious, but states that the thoughts of doing it makes him feel anxious and irritable. He enjoys going to church, and denies any anxiety there or at home.  Although he does not go to places as much, he feels comfortable being at home.  He reports good relationship with his wife.  He denies feeling depressed.  He denies panic attacks.  He sleeps better since being on CPAP machine.  He enjoys working on his car.  He denies SI.  He wishes not to be quickly anger or anxious; discussed skills including grounding technique he might be able to utilize/learn through therapy.  He denies alcohol, drug or cigarette use.  He has not used any hydroxyzine since the last visit.  He feels comfortable to stay on the current medication regimen.    Wt Readings from Last 3 Encounters:  10/06/22 216 lb 9.6 oz (98.2 kg)  07/10/22 233 lb 6.4 oz (105.9 kg)  10/03/21 219 lb 8 oz (99.6 kg)     Visit Diagnosis:    ICD-10-CM   1. MDD (major depressive disorder), recurrent, in full remission ([place])  F33.42     2. Insomnia, unspecified type  G47.00       Past Psychiatric History: Please see initial evaluation for full details. I have reviewed the history. No updates at this time.     Past Medical History:  Past Medical History:  Diagnosis  Date   Arthritis    Circadian rhythm sleep disturbance 09/05/2019   Essential hypertension 07/30/2012   Fast heart beat    Gastro-esophageal reflux disease with esophagitis 07/30/2012   Generalized anxiety disorder 05/12/2017   GERD (gastroesophageal reflux disease)    History of kidney stones    Hyperlipidemia 07/30/2012   Major depressive disorder    OSA (obstructive sleep apnea)    does not use CPAP   Type 2 diabetes mellitus 07/30/2012    Past Surgical History:  Procedure Laterality Date   COLONOSCOPY     COLONOSCOPY N/A 10/06/2018   Procedure: COLONOSCOPY;  Surgeon: Rogene Houston, MD;  [facility];  Service: Endoscopy;  Laterality: N/A;  1:25   DRUG INDUCED ENDOSCOPY N/A 02/27/2021   Procedure: DRUG INDUCED ENDOSCOPY;  Surgeon: Jerrell Belfast, MD;  Location: [facility];  Service: ENT;  Laterality: N/A;   FL INJ RT KNEE CT ARTHROGRAM ([place] HX)     NASAL SEPTOPLASTY W/ TURBINOPLASTY Bilateral 02/27/2021   Procedure: NASAL SEPTOPLASTY WITH TURBINATE REDUCTION;  Surgeon: Jerrell Belfast, MD;  Location: [facility];  Service: ENT;  Laterality: Bilateral;   POLYPECTOMY  10/06/2018   Procedure: POLYPECTOMY;  Surgeon: Rogene Houston, MD;  [facility];  Service: Endoscopy;;  colon    Rod  in lower leg     trauma   Rt hernia repair     SHOULDER ARTHROSCOPY Right 04/16/2021   Procedure: RIGHT SHOULDER ARTHOROSCOPY AND DEBRIDEMENT;  Surgeon: Carole Civil, MD;  Location: [facility];  Service: Orthopedics;  Laterality: Right;    Family Psychiatric History: Please see initial evaluation for full details. I have reviewed the history. No updates at this time.     Family History:  Family History  Adopted: Yes  Family history unknown: Yes    Social History:  Social History   Socioeconomic History   Marital status: Married    Spouse name: Santiago Glad   Number of children: Not on file   Years of education: 12   Highest education  level: High school graduate  Occupational History   Occupation: Retired  Tobacco Use   Smoking status: Never   Smokeless tobacco: Never  Scientific laboratory technician Use: Never used  Substance and Sexual Activity   Alcohol use: Never   Drug use: Never   Sexual activity: Not on file  Other Topics Concern   Not on file  Social History Narrative   Right Handed   Lives in one story with a basement    Drinks Caffeine    Social Determinants of Health   Financial Resource Strain: Not on file  Food Insecurity: Not on file  Transportation Needs: Not on file  Physical Activity: Not on file  Stress: Not on file  Social Connections: Not on file    Allergies: No Known Allergies  Metabolic Disorder Labs: Lab Results  Component Value Date   HGBA1C 6.4 (H) 04/15/2021   MPG 136.98 04/15/2021   No results found for: "PROLACTIN" No results found for: "CHOL", "TRIG", "HDL", "CHOLHDL", "VLDL", "LDLCALC" No results found for: "TSH"  Therapeutic Level Labs: No results found for: "LITHIUM" No results found for: "VALPROATE" No results found for: "CBMZ"  Current Medications: Current Outpatient Medications  Medication Sig Dispense Refill   aspirin EC 81 MG tablet Take 1 tablet (81 mg total) by mouth daily.     atenolol (TENORMIN) 50 MG tablet Take 50 mg by mouth at bedtime.      BREO ELLIPTA 100-25 MCG/INH AEPB Inhale 1 puff into the lungs daily.     cetirizine (ZYRTEC) 10 MG tablet Take 10 mg by mouth at bedtime.      Cholecalciferol (VITAMIN D) 2000 units tablet Take 2,000 Units by mouth daily.     doxycycline (VIBRAMYCIN) 100 MG capsule Take 100 mg by mouth daily.     HYDROcodone bit-homatropine (HYCODAN) 5-1.5 MG/5ML syrup Take 5 mLs by mouth 2 (two) times daily as needed.     hydrOXYzine (ATARAX) 25 MG tablet Take 25 mg by mouth daily as needed.     levofloxacin (LEVAQUIN) 500 MG tablet SMARTSIG:1 Tablet(s) By Mouth     lisinopril-hydrochlorothiazide (PRINZIDE,ZESTORETIC) 20-25 MG tablet  Take 1 tablet by mouth daily.     meloxicam (MOBIC) 15 MG tablet Take 15 mg by mouth daily.     MOUNJARO 2.5 MG/0.5ML Pen SMARTSIG:0.5 Milliliter(s) SUB-Q Once a Week     Multiple Vitamin (MULTIVITAMIN) tablet Take 1 tablet by mouth daily.     NOVOLOG 100 UNIT/ML injection Inject into the skin.     omeprazole (PRILOSEC) 40 MG capsule Take 40 mg by mouth daily.     pravastatin (PRAVACHOL) 80 MG tablet Take 80 mg by mouth at bedtime.      buPROPion (WELLBUTRIN XL) 300 MG 24 hr tablet  Take 1 tablet (300 mg total) by mouth daily. 30 tablet 3   sertraline (ZOLOFT) 100 MG tablet Take 1.5 tablets (150 mg total) by mouth daily. 45 tablet 3   [START ON 10/29/2022] traZODone (DESYREL) 100 MG tablet Take 1 tablet (100 mg total) by mouth at bedtime as needed. for sleep 30 tablet 2   No current facility-administered medications for this visit.     Musculoskeletal: Strength & Muscle Tone: within normal limits Gait & Station: normal Patient leans: N/A  Psychiatric Specialty Exam: Review of Systems  Psychiatric/Behavioral:  Negative for agitation, behavioral problems, confusion, decreased concentration, dysphoric mood, hallucinations, self-injury, sleep disturbance and suicidal ideas. The patient is nervous/anxious. The patient is not hyperactive.   All other systems reviewed and are negative.   Blood pressure 135/78, pulse 64, temperature 98.1 F (36.7 C), temperature source Oral, height '5\' 11"'$  (1.803 m), weight 216 lb 9.6 oz (98.2 kg).Body mass index is 30.21 kg/m.  General Appearance: Fairly Groomed  Eye Contact:  Good  Speech:  Clear and Coherent  Volume:  Normal  Mood:   good  Affect:  Appropriate, Congruent, and Full Range  Thought Process:  Coherent  Orientation:  Full (Time, Place, and Person)  Thought Content: Logical   Suicidal Thoughts:  No  Homicidal Thoughts:  No  Memory:  Immediate;   Good  Judgement:  Good  Insight:  Good  Psychomotor Activity:  Normal  Concentration:   Concentration: Good and Attention Span: Good  Recall:  Good  Fund of Knowledge: Good  Language: Good  Akathisia:  No  Handed:  Right  AIMS (if indicated): not done  Assets:  Communication Skills Desire for Improvement  ADL's:  Intact  Cognition: WNL  Sleep:  Good   Screenings: GAD-7    Flowsheet Row Office Visit from 10/06/2022 in [facility] Office Visit from 07/10/2022 in [facility]  Total GAD-7 Score 3 12      PHQ2-9    [facility] Visit from 10/06/2022 in [facility] Office Visit from 07/10/2022 in [facility] Video Visit from 12/17/2021 in [facility] Video Visit from 08/07/2021 in [facility] Video Visit from 05/14/2021 in [facility]  PHQ-2 Total Score 0 0 0 0 0      [facility] Office Visit from 10/06/2022 in [facility] Admission (Discharged) from 04/16/2021 in [facility] from 04/15/2021 in [facility] No Risk No Risk Error: Question 6 not populated        Assessment and Plan:  Paul Webb is a 66 y.o. year old male with a history of depression, anxiety,  diabetes, hyperlipidemia, severe sleep apnea (on BiPAP), who presents for follow up appointment for below.   1. MDD (major depressive disorder), recurrent, in full remission ([place]) He denies any significant mood symptoms except situational anxiety/irritability. Psychosocial stressors includes demoralization due to diabetes and unemployment.  He engages in daily activities, going to church, and reports good relationship with his wife.  Will continue current dose of centering and bupropion as maintenance treatment for depression (he has been on the current same medication regimen without significant  symptoms since April 2021). Will continue hydroxyzine as needed for anxiety.  He was advised to consider psychotherapy to target anxiety if he is interested in the future.   2. Insomnia, unspecified type Improving since starting to use BiPAP  machine.  Will continue current dose of trazodone as needed for insomnia.    Plan Continue sertraline 150 mg daily  Continue bupropion 300 mg daily  Continue Trazodone 100 mg at night as needed for insomnia Continue hydroxyzine 25 mg daily as needed for anxiety- he declined refill Next appointment: 2/5 at 1 PM for 30 mins, in person.  He prefers to be transferred to [place] office if possible.    Past trials of medication: ?lexapro, venlafaxine, Abilify   The patient demonstrates the following risk factors for suicide: Chronic risk factors for suicide include: psychiatric disorder of depression. Acute risk factors for suicide include: family or marital conflict and loss (financial, interpersonal, professional). Protective factors for this patient include: coping skills and hope for the future. Considering these factors, the overall suicide risk at this point appears to be low. Patient is appropriate for outpatient follow up.       Collaboration of Care: Collaboration of Care: Other reviewed notes in Epic  Patient/Guardian was advised Release of Information must be obtained prior to any record release in order to collaborate their care with an outside provider. Patient/Guardian was advised if they have not already done so to contact the registration department to sign all necessary forms in order for [place] to release information regarding their care.   Consent: Patient/Guardian gives verbal consent for treatment and assignment of benefits for services provided during this visit. Patient/Guardian expressed understanding and agreed to proceed.    Norman Clay, MD 10/06/2022, 1:37 PM

## 2022-10-02 DIAGNOSIS — J069 Acute upper respiratory infection, unspecified: Secondary | ICD-10-CM | POA: Diagnosis not present

## 2022-10-02 DIAGNOSIS — Z299 Encounter for prophylactic measures, unspecified: Secondary | ICD-10-CM | POA: Diagnosis not present

## 2022-10-02 DIAGNOSIS — E1165 Type 2 diabetes mellitus with hyperglycemia: Secondary | ICD-10-CM | POA: Diagnosis not present

## 2022-10-06 ENCOUNTER — Ambulatory Visit (INDEPENDENT_AMBULATORY_CARE_PROVIDER_SITE_OTHER): Payer: Medicare PPO | Admitting: Psychiatry

## 2022-10-06 ENCOUNTER — Encounter: Payer: Self-pay | Admitting: Psychiatry

## 2022-10-06 VITALS — BP 135/78 | HR 64 | Temp 98.1°F | Ht 71.0 in | Wt 216.6 lb

## 2022-10-06 DIAGNOSIS — G4700 Insomnia, unspecified: Secondary | ICD-10-CM | POA: Diagnosis not present

## 2022-10-06 DIAGNOSIS — F3342 Major depressive disorder, recurrent, in full remission: Secondary | ICD-10-CM

## 2022-10-06 MED ORDER — BUPROPION HCL ER (XL) 300 MG PO TB24: 300.0000 mg | ORAL_TABLET | Freq: Every day | ORAL | 3 refills | Status: DC

## 2022-10-06 MED ORDER — SERTRALINE HCL 100 MG PO TABS: 150.0000 mg | ORAL_TABLET | Freq: Every day | ORAL | 3 refills | Status: DC

## 2022-10-06 MED ORDER — TRAZODONE HCL 100 MG PO TABS: 100.0000 mg | ORAL_TABLET | Freq: Every evening | ORAL | 2 refills | Status: DC | PRN

## 2022-10-06 NOTE — Patient Instructions (Signed)
Continue sertraline 150 mg daily  Continue bupropion 300 mg daily  Continue Trazodone 100 mg at night as needed for insomnia Continue hydroxyzine 25 mg daily as needed for anxiety Next appointment: 2/5 at 1 PM

## 2022-10-21 DIAGNOSIS — L820 Inflamed seborrheic keratosis: Secondary | ICD-10-CM | POA: Diagnosis not present

## 2022-10-21 DIAGNOSIS — L281 Prurigo nodularis: Secondary | ICD-10-CM | POA: Diagnosis not present

## 2022-10-24 DIAGNOSIS — Z794 Long term (current) use of insulin: Secondary | ICD-10-CM | POA: Diagnosis not present

## 2022-10-24 DIAGNOSIS — H25813 Combined forms of age-related cataract, bilateral: Secondary | ICD-10-CM | POA: Diagnosis not present

## 2022-10-24 DIAGNOSIS — E109 Type 1 diabetes mellitus without complications: Secondary | ICD-10-CM | POA: Diagnosis not present

## 2022-10-29 DIAGNOSIS — G4733 Obstructive sleep apnea (adult) (pediatric): Secondary | ICD-10-CM | POA: Diagnosis not present

## 2022-11-05 DIAGNOSIS — E785 Hyperlipidemia, unspecified: Secondary | ICD-10-CM | POA: Diagnosis not present

## 2022-11-05 DIAGNOSIS — E1065 Type 1 diabetes mellitus with hyperglycemia: Secondary | ICD-10-CM | POA: Diagnosis not present

## 2022-11-18 DIAGNOSIS — E1065 Type 1 diabetes mellitus with hyperglycemia: Secondary | ICD-10-CM | POA: Diagnosis not present

## 2022-12-01 ENCOUNTER — Ambulatory Visit (INDEPENDENT_AMBULATORY_CARE_PROVIDER_SITE_OTHER): Payer: Medicare PPO | Admitting: Neurology

## 2022-12-01 DIAGNOSIS — G4733 Obstructive sleep apnea (adult) (pediatric): Secondary | ICD-10-CM | POA: Diagnosis not present

## 2022-12-01 DIAGNOSIS — G4731 Primary central sleep apnea: Secondary | ICD-10-CM

## 2022-12-01 DIAGNOSIS — R0902 Hypoxemia: Secondary | ICD-10-CM

## 2022-12-01 DIAGNOSIS — G4720 Circadian rhythm sleep disorder, unspecified type: Secondary | ICD-10-CM

## 2022-12-08 NOTE — Procedures (Unsigned)
Physician Interpretation (Study was interpreted on behalf of Dr. Brett Fairy.   ):   Referred by: Vaughan Browner, NP/Dr. Dohmeier  History: 67 year old male with an underlying medical history of obstructive sleep apnea, central sleep apnea, diabetes, arthritis, reflux disease, anxiety, depression, hyperlipidemia, history of kidney stones, hypertension, and obesity, who presents for a designated BiPAP titration study to optimize treatment settings and oxygen saturations.  He has been on auto BiPAP with a maximum IPAP of 24, minimum EPAP of 10 cm, 4 cm of pressure support with excellent compliance but suboptimal oxygen saturations.  Residual AHI has also been elevated in the mild to moderate range.  An overnight pulse oximetry test from 08/12/2022 while on auto BiPAP therapy showed a desaturation nadir of 74%, time below or at 88% saturation of 1 hour and 54 minutes. The patient took trazodone prior to start of sleep study.  TITRATION DETAILS (SEE ALSO TABLE AT THE END OF THE REPORT):  The patient was fitted with a large ResMed F20 AirTouch fullface mask.  This is the mask he uses typically at home.  The patient was started on standard BiPAP of 16/12 cm and titrated to a final pressure of 21/17 cm.  A backup rate of 11 was trialed on pressures 17/13 and 18/14 cm.  The patient appeared to have more arousals while on BiPAP ST and was therefore switched to standard BiPAP therapy again. On the final pressure of standard BiPAP of 21/17 cm he achieved a total sleep time of 106.5 minutes AHI reduced to 2.25/h, with brief nonsupine REM sleep achieved, O2 nadir 89%.   EEG: Review of the EEG showed no abnormal electrical discharges and symmetrical bihemispheric findings.    EKG: The EKG revealed normal sinus rhythm (NSR).   AUDIO/VIDEO REVIEW: The audio and video review did not show any abnormal or unusual behaviors, movements, phonations or vocalizations. The patient took that sleep was 1 restroom break.  Snoring was  improved.   POST-STUDY QUESTIONNAIRE: Post study, the patient indicated that sleep was worse than usual and that his mask was leaking.  IMPRESSION:   Obstructive sleep apnea (OSA) Central Sleep Apnea Dysfunctions associated with sleep stages or arousal from sleep  RECOMMENDATIONS:   This study demonstrates near-complete resolution of the patient's obstructive and central sleep apnea on standard BiPAP therapy.  I recommend changing his auto BiPAP to a set pressure of 21 over 17 cm via fullface mask of choice, sized to fit. The patient should be reminded to be fully compliant with PAP therapy to improve sleep related symptoms and decrease long term cardiovascular risks. The patient should be reminded, that it may take up to 3 months to get fully used to using PAP with all planned sleep. The earlier full compliance is achieved, the better long term compliance tends to be. Please note that untreated obstructive sleep apnea may carry additional perioperative morbidity. Patients with significant obstructive sleep apnea should receive perioperative PAP therapy and the surgeons and particularly the anesthesiologist should be informed of the diagnosis and the severity of the sleep disordered breathing. This study shows sleep fragmentation and abnormal sleep stage percentages; these are nonspecific findings and per se do not signify an intrinsic sleep disorder or a cause for the patient's sleep-related symptoms. Causes include (but are not limited to) the first night effect of the sleep study, circadian rhythm disturbances, medication effect or an underlying mood disorder or medical problem.  The patient should be cautioned not to drive, work at heights, or operate dangerous  or heavy equipment when tired or sleepy. Review and reiteration of good sleep hygiene measures should be pursued with any patient. The patient will be seen in follow-up in the sleep clinic at [facility] for discussion of the test results, symptom  and treatment compliance review, further management strategies, etc. The referring provider will be notified of the test results.   I certify that I have reviewed the entire raw data recording prior to the issuance of this report in accordance with the Standards of Accreditation of the American Academy of Sleep Medicine (AASM).  Star Age, MD, PhD Medical Director, [facility] at [facility]) Diplomat, ABPN (Neurology and Sleep)   ***Technical Report:   ***Titration Table:

## 2022-12-10 ENCOUNTER — Telehealth: Payer: Self-pay | Admitting: *Deleted

## 2022-12-10 NOTE — Telephone Encounter (Signed)
I called pt, he had not seen mychart message.  I did relay that the study did show that pressure of 21/17 via FFM of choice and sized to fit was ordered for him.  He verbalized understanding.  Order message tsent to [place].

## 2022-12-10 NOTE — Telephone Encounter (Signed)
-----  Message from Ward Givens, NP sent at 12/10/2022 10:45 AM EST ----- Order placed and MyChart message sent

## 2022-12-10 NOTE — Addendum Note (Signed)
Addended by: Trudie Buckler on: 12/10/2022 10:45 AM   Modules accepted: Orders

## 2022-12-10 NOTE — Telephone Encounter (Signed)
New, Willodean Rosenthal, RN; Alonna Minium; Minus Liberty; Marlowe Sax, Pasty Spillers to [place] branch for review and processing. Thank you!     Previous Messages    ----- Message ----- From: Brandon Melnick, RN Sent: 12/10/2022  11:19 AM EST To: Darlina Guys; Miquel Dunn; Nash Shearer; * Subject: change pressure to 21/17 cm via FFM of choic*  Good morning,  New order in EPIC for pt.  Paul Webb. Eulice Rutledge" Male, 67 y.o., 11-08-1956 Pronouns: he/him/his MRN: [id number] Phone: [phone]   Thank you  Lovey Newcomer RN

## 2022-12-11 DIAGNOSIS — L281 Prurigo nodularis: Secondary | ICD-10-CM | POA: Diagnosis not present

## 2022-12-11 DIAGNOSIS — D485 Neoplasm of uncertain behavior of skin: Secondary | ICD-10-CM | POA: Diagnosis not present

## 2022-12-11 DIAGNOSIS — L570 Actinic keratosis: Secondary | ICD-10-CM | POA: Diagnosis not present

## 2022-12-11 DIAGNOSIS — D239 Other benign neoplasm of skin, unspecified: Secondary | ICD-10-CM | POA: Diagnosis not present

## 2022-12-16 ENCOUNTER — Telehealth: Payer: Self-pay | Admitting: Adult Health

## 2022-12-16 NOTE — Telephone Encounter (Signed)
I spoke to wife.  She is trying to get the pressure changed on his bipap machine.  I relayed that message was sent to [facility] branch to process.   They have an order for new bipap machine.  I relayed that there is no order for bipap machine only pressure changes.  I called and went thru multiple depts after calling [phone] Eddie Dibbles, sent me to sleep managemnt dept then to new start [place] (whom would cancel the bipap order (for new machine), then to compliance dept- [place]vin.  He said to fax order to [phone] or [phone]. He said that wife or pt has to call regarding pts dob on airview  (same date as set up date). 02-13-2022 latest machine.  Fax confirmation.

## 2022-12-17 NOTE — Telephone Encounter (Signed)
I called and spoke to wife relayed message below she will call # below and relay the issue.  I did fax the order yesterday with confirmation.  I relayed will be glad to call again, she said she will do and call me back if needed.  Possibly to change DME to [facility].  Will hold for now.  Called Brad with [place] [facility] [employer] to call me back about this and see if can assist.

## 2022-12-17 NOTE — Telephone Encounter (Signed)
Pt's wife states  RN left her a message but it was not clear.  Pt's wife is asking for a call back at [phone]

## 2022-12-22 NOTE — Telephone Encounter (Signed)
error 

## 2022-12-25 DIAGNOSIS — L820 Inflamed seborrheic keratosis: Secondary | ICD-10-CM | POA: Diagnosis not present

## 2022-12-25 DIAGNOSIS — L729 Follicular cyst of the skin and subcutaneous tissue, unspecified: Secondary | ICD-10-CM | POA: Diagnosis not present

## 2022-12-27 NOTE — Progress Notes (Unsigned)
BH MD/PA/NP OP Progress Note  12/29/2022 1:23 PM Paul Webb  MRN:  [id number]  Chief Complaint:  Chief Complaint  Patient presents with   Follow-up   HPI:  This is a follow-up appointment for depression and insomnia.  He states that he has been doing well.  He had good Christmas with his grandchildren.  He enjoys working on truck, and his wife helps with it.  He is on [facility], which has been very helpful for weight loss.  His Hba1c has been decreased. He feels super gassy if he were to eat large amount.  He is mindful of his diet.  He sleeps well, using trazodone and BiPAP machine.  He denies feeling depressed or anxiety.  He denies SI.  He states that he enjoys the way as it is, feeling better, and he would like to continue the medication as it is as he does not want to feel that way as he used to.    Wt Readings from Last 3 Encounters:  12/29/22 214 lb (97.1 kg)  10/06/22 216 lb 9.6 oz (98.2 kg)  07/10/22 233 lb 6.4 oz (105.9 kg)     Visit Diagnosis:    ICD-10-CM   1. MDD (major depressive disorder), recurrent, in full remission ([place])  F33.42     2. Insomnia, unspecified type  G47.00       Past Psychiatric History: Please see initial evaluation for full details. I have reviewed the history. No updates at this time.     Past Medical History:  Past Medical History:  Diagnosis Date   Arthritis    Circadian rhythm sleep disturbance 09/05/2019   Essential hypertension 07/30/2012   Fast heart beat    Gastro-esophageal reflux disease with esophagitis 07/30/2012   Generalized anxiety disorder 05/12/2017   GERD (gastroesophageal reflux disease)    History of kidney stones    Hyperlipidemia 07/30/2012   Major depressive disorder    OSA (obstructive sleep apnea)    does not use CPAP   Type 2 diabetes mellitus 07/30/2012    Past Surgical History:  Procedure Laterality Date   COLONOSCOPY     COLONOSCOPY N/A 10/06/2018   Procedure: COLONOSCOPY;  Surgeon: Rogene Houston, MD;   [facility];  Service: Endoscopy;  Laterality: N/A;  1:25   DRUG INDUCED ENDOSCOPY N/A 02/27/2021   Procedure: DRUG INDUCED ENDOSCOPY;  Surgeon: Jerrell Belfast, MD;  Location: [place];  Service: ENT;  Laterality: N/A;   FL INJ RT KNEE CT ARTHROGRAM ([place] HX)     NASAL SEPTOPLASTY W/ TURBINOPLASTY Bilateral 02/27/2021   Procedure: NASAL SEPTOPLASTY WITH TURBINATE REDUCTION;  Surgeon: Jerrell Belfast, MD;  Location: [place];  Service: ENT;  Laterality: Bilateral;   POLYPECTOMY  10/06/2018   Procedure: POLYPECTOMY;  Surgeon: Rogene Houston, MD;  [facility];  Service: Endoscopy;;  colon    Rod in lower leg     trauma   Rt hernia repair     SHOULDER ARTHROSCOPY Right 04/16/2021   Procedure: RIGHT SHOULDER ARTHOROSCOPY AND DEBRIDEMENT;  Surgeon: Carole Civil, MD;  [facility];  Service: Orthopedics;  Laterality: Right;    Family Psychiatric History: Please see initial evaluation for full details. I have reviewed the history. No updates at this time.     Family History:  Family History  Adopted: Yes  Family history unknown: Yes    Social History:  Social History   Socioeconomic History   Marital status: Married  Spouse name: Santiago Glad   Number of children: Not on file   Years of education: 12   Highest education level: High school graduate  Occupational History   Occupation: Retired  Tobacco Use   Smoking status: Never   Smokeless tobacco: Never  Vaping Use   Vaping Use: Never used  Substance and Sexual Activity   Alcohol use: Never   Drug use: Never   Sexual activity: Not on file  Other Topics Concern   Not on file  Social History Narrative   Right Handed   Lives in one story with a basement    Drinks Caffeine    Social Determinants of Health   Financial Resource Strain: Not on file  Food Insecurity: Not on file  Transportation Needs: Not on file  Physical Activity: Not on file  Stress: Not on  file  Social Connections: Not on file    Allergies: No Known Allergies  Metabolic Disorder Labs: Lab Results  Component Value Date   HGBA1C 6.4 (H) 04/15/2021   MPG 136.98 04/15/2021   No results found for: "PROLACTIN" No results found for: "CHOL", "TRIG", "HDL", "CHOLHDL", "VLDL", "LDLCALC" No results found for: "TSH"  Therapeutic Level Labs: No results found for: "LITHIUM" No results found for: "VALPROATE" No results found for: "CBMZ"  Current Medications: Current Outpatient Medications  Medication Sig Dispense Refill   aspirin EC 81 MG tablet Take 1 tablet (81 mg total) by mouth daily.     atenolol (TENORMIN) 50 MG tablet Take 50 mg by mouth at bedtime.      BREO ELLIPTA 100-25 MCG/INH AEPB Inhale 1 puff into the lungs daily.     cetirizine (ZYRTEC) 10 MG tablet Take 10 mg by mouth at bedtime.      Cholecalciferol (VITAMIN D) 2000 units tablet Take 2,000 Units by mouth daily.     lisinopril-hydrochlorothiazide (PRINZIDE,ZESTORETIC) 20-25 MG tablet Take 1 tablet by mouth daily.     MOUNJARO 2.5 MG/0.5ML Pen SMARTSIG:0.5 Milliliter(s) SUB-Q Once a Week     Multiple Vitamin (MULTIVITAMIN) tablet Take 1 tablet by mouth daily.     NOVOLOG 100 UNIT/ML injection Inject into the skin.     omeprazole (PRILOSEC) 40 MG capsule Take 40 mg by mouth daily.     pravastatin (PRAVACHOL) 80 MG tablet Take 80 mg by mouth at bedtime.      traZODone (DESYREL) 100 MG tablet Take 1 tablet (100 mg total) by mouth at bedtime as needed. for sleep 30 tablet 2   [START ON 02/03/2023] buPROPion (WELLBUTRIN XL) 300 MG 24 hr tablet Take 1 tablet (300 mg total) by mouth daily. 30 tablet 4   hydrOXYzine (ATARAX) 25 MG tablet Take 25 mg by mouth daily as needed. (Patient not taking: Reported on 12/29/2022)     levofloxacin (LEVAQUIN) 500 MG tablet SMARTSIG:1 Tablet(s) By Mouth (Patient not taking: Reported on 12/29/2022)     meloxicam (MOBIC) 15 MG tablet Take 15 mg by mouth daily. (Patient not taking: Reported  on 12/29/2022)     [START ON 02/04/2023] sertraline (ZOLOFT) 100 MG tablet Take 1.5 tablets (150 mg total) by mouth daily. 45 tablet 4   No current facility-administered medications for this visit.     Musculoskeletal: Strength & Muscle Tone: within normal limits Gait & Station: normal Patient leans: N/A  Psychiatric Specialty Exam: Review of Systems  Psychiatric/Behavioral: Negative.    All other systems reviewed and are negative.   Blood pressure 132/66, pulse 66, temperature 98.3 F (36.8 C), height 5'  9.25" (1.759 m), weight 214 lb (97.1 kg), SpO2 95 %.Body mass index is 31.37 kg/m.  General Appearance: Well Groomed  Eye Contact:  Good  Speech:  Clear and Coherent  Volume:  Normal  Mood:   good  Affect:  Appropriate, Congruent, and good  Thought Process:  Coherent  Orientation:  Full (Time, Place, and Person)  Thought Content: Logical   Suicidal Thoughts:  No  Homicidal Thoughts:  No  Memory:  Immediate;   Good  Judgement:  Good  Insight:  Good  Psychomotor Activity:  Normal  Concentration:  Concentration: Good and Attention Span: Good  Recall:  Good  Fund of Knowledge: Good  Language: Good  Akathisia:  No  Handed:  Right  AIMS (if indicated): not done  Assets:  Communication Skills Desire for Improvement  ADL's:  Intact  Cognition: WNL  Sleep:  Good   Screenings: GAD-7    Flowsheet Row Office Visit from 12/29/2022 in [place] Office Visit from 10/06/2022 in [place] Office Visit from 07/10/2022 in [place]  Total GAD-7 Score 0 3 12      PHQ2-9    [place] Office Visit from 12/29/2022 in [place] Office Visit from 10/06/2022 in [place] Office Visit from 07/10/2022 in [place] Video Visit from  12/17/2021 in [place] Video Visit from 08/07/2021 in [place]  PHQ-2 Total Score 0 0 0 0 0      [place] Office Visit from 12/29/2022 in [place] Office Visit from 10/06/2022 in [place] Admission (Discharged) from 04/16/2021 in [place] No Risk No Risk No Risk        Assessment and Plan:  AMRO WINEBARGER is a 67 y.o. year old male with a history of depression, anxiety,  diabetes, hyperlipidemia, severe sleep apnea (on BiPAP), who presents for follow up appointment for below.    1. MDD (major depressive disorder), recurrent, in full remission ([place]) Acute stressors include:n/a  Other stressors include: retired,previously unemployed due to being in insulin pump   History: previously on venlafaxine for anxiety for 20 years, worsened after unemployment   He denies any significant mood symptoms since the last visit.  He reports good relationship with his wife, grandchildren.  He enjoys working on truck.  Although it was discussed regarding the option of trying monotherapy, he preferred to stay on the current medication.  Will continue current medication regimen of bupropion and sertraline as maintenance treatment for depression (he has been on the current same medication regimen without significant symptoms since April 2021).  Will continue hydroxyzine as needed for anxiety.   2. Insomnia, unspecified type Improving.  He uses BiPAP machine.  Will continue current dose of trazodone as needed for insomnia.     Plan Continue sertraline 150 mg daily  Continue bupropion 300 mg daily  Continue Trazodone 100 mg at night as needed for insomnia Continue hydroxyzine 25 mg daily as needed for anxiety- he declined refill Next appointment: 7/29 at 1 PM for 30 mins, in person.  He is not  interested in transferring to [place] office anymore.   Past trials of medication: ?lexapro, venlafaxine, Abilify   The patient demonstrates the following risk factors for suicide: Chronic risk factors  for suicide include: psychiatric disorder of depression. Acute risk factors for suicide include: family or marital conflict and loss (financial, interpersonal, professional). Protective factors for this patient include: coping skills and hope for the future. Considering these factors, the overall suicide risk at this point appears to be low. Patient is appropriate for outpatient follow up.      Collaboration of Care: Collaboration of Care: Other reviewed notes in Epic  Patient/Guardian was advised Release of Information must be obtained prior to any record release in order to collaborate their care with an outside provider. Patient/Guardian was advised if they have not already done so to contact the registration department to sign all necessary forms in order for [place] to release information regarding their care.   Consent: Patient/Guardian gives verbal consent for treatment and assignment of benefits for services provided during this visit. Patient/Guardian expressed understanding and agreed to proceed.    Norman Clay, MD 12/29/2022, 1:23 PM

## 2022-12-29 ENCOUNTER — Ambulatory Visit (INDEPENDENT_AMBULATORY_CARE_PROVIDER_SITE_OTHER): Payer: Medicare PPO | Admitting: Psychiatry

## 2022-12-29 ENCOUNTER — Encounter: Payer: Self-pay | Admitting: Psychiatry

## 2022-12-29 VITALS — BP 132/66 | HR 66 | Temp 98.3°F | Ht 69.25 in | Wt 214.0 lb

## 2022-12-29 DIAGNOSIS — F3342 Major depressive disorder, recurrent, in full remission: Secondary | ICD-10-CM

## 2022-12-29 DIAGNOSIS — G4700 Insomnia, unspecified: Secondary | ICD-10-CM

## 2022-12-29 MED ORDER — BUPROPION HCL ER (XL) 300 MG PO TB24: 300.0000 mg | ORAL_TABLET | Freq: Every day | ORAL | 4 refills | Status: DC

## 2022-12-29 MED ORDER — SERTRALINE HCL 100 MG PO TABS: 150.0000 mg | ORAL_TABLET | Freq: Every day | ORAL | 4 refills | Status: DC

## 2022-12-29 NOTE — Patient Instructions (Signed)
Continue sertraline 150 mg daily  Continue bupropion 300 mg daily  Continue Trazodone 100 mg at night as needed for insomnia Continue hydroxyzine 25 mg daily as needed for anxiety Next appointment: 7/29 at 1 PM

## 2023-01-19 ENCOUNTER — Other Ambulatory Visit: Payer: Self-pay | Admitting: Psychiatry

## 2023-01-22 ENCOUNTER — Encounter: Payer: Self-pay | Admitting: Radiology

## 2023-01-27 DIAGNOSIS — G4733 Obstructive sleep apnea (adult) (pediatric): Secondary | ICD-10-CM | POA: Diagnosis not present

## 2023-01-30 DIAGNOSIS — E1065 Type 1 diabetes mellitus with hyperglycemia: Secondary | ICD-10-CM | POA: Diagnosis not present

## 2023-02-04 DIAGNOSIS — E785 Hyperlipidemia, unspecified: Secondary | ICD-10-CM | POA: Diagnosis not present

## 2023-02-04 DIAGNOSIS — Z9641 Presence of insulin pump (external) (internal): Secondary | ICD-10-CM | POA: Diagnosis not present

## 2023-02-04 DIAGNOSIS — Z978 Presence of other specified devices: Secondary | ICD-10-CM | POA: Diagnosis not present

## 2023-02-04 DIAGNOSIS — E1065 Type 1 diabetes mellitus with hyperglycemia: Secondary | ICD-10-CM | POA: Diagnosis not present

## 2023-02-16 DIAGNOSIS — E1065 Type 1 diabetes mellitus with hyperglycemia: Secondary | ICD-10-CM | POA: Diagnosis not present

## 2023-03-26 DIAGNOSIS — J449 Chronic obstructive pulmonary disease, unspecified: Secondary | ICD-10-CM | POA: Diagnosis not present

## 2023-03-26 DIAGNOSIS — E1165 Type 2 diabetes mellitus with hyperglycemia: Secondary | ICD-10-CM | POA: Diagnosis not present

## 2023-03-26 DIAGNOSIS — Z299 Encounter for prophylactic measures, unspecified: Secondary | ICD-10-CM | POA: Diagnosis not present

## 2023-03-26 DIAGNOSIS — R279 Unspecified lack of coordination: Secondary | ICD-10-CM | POA: Diagnosis not present

## 2023-03-26 DIAGNOSIS — I10 Essential (primary) hypertension: Secondary | ICD-10-CM | POA: Diagnosis not present

## 2023-03-26 DIAGNOSIS — F321 Major depressive disorder, single episode, moderate: Secondary | ICD-10-CM | POA: Diagnosis not present

## 2023-03-31 ENCOUNTER — Encounter: Payer: Self-pay | Admitting: Physician Assistant

## 2023-04-13 DIAGNOSIS — B360 Pityriasis versicolor: Secondary | ICD-10-CM | POA: Diagnosis not present

## 2023-04-13 DIAGNOSIS — L821 Other seborrheic keratosis: Secondary | ICD-10-CM | POA: Diagnosis not present

## 2023-04-13 DIAGNOSIS — L281 Prurigo nodularis: Secondary | ICD-10-CM | POA: Diagnosis not present

## 2023-04-13 DIAGNOSIS — D239 Other benign neoplasm of skin, unspecified: Secondary | ICD-10-CM | POA: Diagnosis not present

## 2023-04-13 DIAGNOSIS — L814 Other melanin hyperpigmentation: Secondary | ICD-10-CM | POA: Diagnosis not present

## 2023-04-13 DIAGNOSIS — D225 Melanocytic nevi of trunk: Secondary | ICD-10-CM | POA: Diagnosis not present

## 2023-04-13 DIAGNOSIS — L578 Other skin changes due to chronic exposure to nonionizing radiation: Secondary | ICD-10-CM | POA: Diagnosis not present

## 2023-04-24 ENCOUNTER — Ambulatory Visit: Payer: Medicare PPO

## 2023-04-24 ENCOUNTER — Ambulatory Visit: Payer: Medicare PPO | Admitting: Physician Assistant

## 2023-04-24 ENCOUNTER — Encounter: Payer: Self-pay | Admitting: Physician Assistant

## 2023-04-24 VITALS — BP 103/66 | HR 84 | Resp 20 | Wt 200.0 lb

## 2023-04-24 DIAGNOSIS — R413 Other amnesia: Secondary | ICD-10-CM

## 2023-04-24 NOTE — Progress Notes (Cosign Needed Addendum)
Assessment/Plan:   Paul Webb is a very pleasant 67 y.o. year old RH male with a history of hypertension, hyperlipidemia, circadian rhythm sleep disturbance, GAD, MDD, OSA on BiPAP, DM2, seen today for evaluation of memory difficulties.  Patient had a prior neuropsychological evaluation on November 29, 2020 yielding largely normal limits of functioning.  Since that time, the patient reports some changes in his cognitive status. He still able to participate in all activities of daily living, and continues to drive.  MoCA today is 24/30 .  Other workup is in progress.    Memory Impairment  MRI brain without contrast to assess for underlying structural abnormality and assess vascular load  Neurocognitive testing to further evaluate cognitive concerns and determine other underlying cause of memory changes, including potential contribution from sleep, anxiety, attention, or depression   Continue to replenish B12 Continue to control mood as per PCP.  Patient is on multiple agents including, Abilify, Zoloft, Wellbutrin, and he takes trazodone to sleep.  Polypharmacy may play a role on memory changes Continue psychotherapy for anxiety and depression Recommend using BiPAP for OSA   Recommend good control of cardiovascular risk factors [place] up in 1 month  Subjective:   The patient is accompanied by his wife  who supplements the history.   How long did patient have memory difficulties? About 5 years, gradually worse.  He continues to have word finding difficulties.  Patient denies  difficulty remembering recent conversations and people names.  Sometimes he says one thing but he thinks another.  He reports decreased attention and concentration, sometimes his  "mind wanders ". repeats oneself? Denies  Disoriented when walking into a room?  Patient denies   Leaving objects in unusual places? denies   Wandering behavior?  denies   Any personality changes?  Patient denies   Any history of  depression?:  Endorsed.  He has a history of anxiety and depression.  This has been present for over 30 years, after the death of his first wife.  He is under the care of psychiatry but he is not on psychotherapy. Hallucinations or paranoia?  Patient denies   Seizures?   Patient denies    Any sleep changes?  Sleeps well, Denies vivid dreams, REM behavior or sleepwalking   Sleep apnea?  Endorsed, he is on BiPAP, followed by [facility], could not tolerate CPAP  Any hygiene concerns?  Patient denies   Independent of bathing and dressing?  Endorsed  Does the patient needs help with medications? Wife is in charge   Who is in charge of the finances?  Wife is in charge     Any changes in appetite?  Denies, He is on Mounjaro and has lost 30 lbs in 8 months planning to lose another 10 pounds.      Patient have trouble swallowing? denies   Does the patient cook?  "Not a lot " Any kitchen accidents such as leaving the stove on? denies   Any headaches?   denies   Chronic back pain ? denies   Recent falls or head injuries? Periodically he stumbles and may sustain a mechanical fall. He does not stumble on flat surfaces . Has a history of L knee arthritis and peripheral neuropathy due to diabetes.  Vision changes? denies   Unilateral weakness, numbness or tingling?  He has peripheral neuropathy due to diabetes. Any tremors?  He reports intermittent tremors bilaterally when performing fine motor actions, such as using a screwdriver for example.  However,  if he holds a heavier object he does not have tremors.  Any anosmia?  denies   Any incontinence of urine? denies   Any bowel dysfunction? denies      Patient lives with his wife  History of heavy alcohol intake? denies   History of heavy tobacco use? denies   Family history of dementia? He was adopted.  Does patient drive?  Every once in a while he may hesitate or give his wife the wrong direction .   Prior MRI of the brain 11/20/2020 showed minimal chronic  small vessel ischemia as well as a remote inferior right frontal insult.  He had normal volume, without atrophy.  Retired Naval architect.  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   Past Medical History:  Diagnosis Date   Arthritis    Circadian rhythm sleep disturbance 09/05/2019   Essential hypertension 07/30/2012   Fast heart beat    Gastro-esophageal reflux disease with esophagitis 07/30/2012   Generalized anxiety disorder 05/12/2017   GERD (gastroesophageal reflux disease)    History of kidney stones    Hyperlipidemia 07/30/2012   Major depressive disorder    OSA (obstructive sleep apnea)    does not use CPAP   Type 2 diabetes mellitus 07/30/2012     Past Surgical History:  Procedure Laterality Date   COLONOSCOPY     COLONOSCOPY N/A 10/06/2018   Procedure: COLONOSCOPY;  Surgeon: Malissa Hippo, MD;  [facility];  Service: Endoscopy;  Laterality: N/A;  1:25   DRUG INDUCED ENDOSCOPY N/A 02/27/2021   Procedure: DRUG INDUCED ENDOSCOPY;  Surgeon: Osborn Coho, MD;  Location: [facility];  Service: ENT;  Laterality: N/A;   FL INJ RT KNEE CT ARTHROGRAM ([facility] HX)     NASAL SEPTOPLASTY W/ TURBINOPLASTY Bilateral 02/27/2021   Procedure: NASAL SEPTOPLASTY WITH TURBINATE REDUCTION;  Surgeon: Osborn Coho, MD;  Location: [facility];  Service: ENT;  Laterality: Bilateral;   POLYPECTOMY  10/06/2018   Procedure: POLYPECTOMY;  Surgeon: Malissa Hippo, MD;  [facility];  Service: Endoscopy;;  colon    Rod in lower leg     trauma   Rt hernia repair     SHOULDER ARTHROSCOPY Right 04/16/2021   Procedure: RIGHT SHOULDER ARTHOROSCOPY AND DEBRIDEMENT;  Surgeon: Vickki Hearing, MD;  [facility];  Service: Orthopedics;  Laterality: Right;     No Known Allergies  Current Outpatient Medications  Medication Instructions   aspirin EC 81 mg, Oral, Daily   atenolol  (TENORMIN) 50 mg, Oral, Daily at bedtime   BREO ELLIPTA 100-25 MCG/INH AEPB 1 puff, Inhalation, Daily   buPROPion (WELLBUTRIN XL) 300 mg, Oral, Daily   cetirizine (ZYRTEC) 10 mg, Oral, Daily at bedtime   hydrOXYzine (ATARAX) 25 mg, Oral, Daily PRN   levofloxacin (LEVAQUIN) 500 MG tablet    lisinopril-hydrochlorothiazide (PRINZIDE,ZESTORETIC) 20-25 MG tablet 1 tablet, Oral, Daily   meloxicam (MOBIC) 15 mg, Oral, Daily   MOUNJARO 2.5 MG/0.5ML Pen SMARTSIG:0.5 Milliliter(s) SUB-Q Once a Week   Multiple Vitamin (MULTIVITAMIN) tablet 1 tablet, Oral, Daily   NOVOLOG 100 UNIT/ML injection Subcutaneous   omeprazole (PRILOSEC) 40 mg, Oral, Daily   pravastatin (PRAVACHOL) 80 mg, Oral, Daily at bedtime   sertraline (ZOLOFT) 150 mg, Oral, Daily   traZODone (DESYREL) 100 mg, Oral, At bedtime PRN   Vitamin D 2,000 Units, Oral, Daily     VITALS:   Vitals:  04/24/23 0940  BP: 103/66  Pulse: 84  Resp: 20  SpO2: 98%  Weight: 200 lb (90.7 kg)      PHYSICAL EXAM   HEENT:  Normocephalic, atraumatic. The mucous membranes are moist. The superficial temporal arteries are without ropiness or tenderness. Cardiovascular: Regular rate and rhythm. Lungs: Clear to auscultation bilaterally. Neck: There are no carotid bruits noted bilaterally.  NEUROLOGICAL:    04/24/2023    9:00 AM  Montreal Cognitive Assessment   Visuospatial/ Executive (0/5) 3  Naming (0/3) 3  Attention: Read list of digits (0/2) 2  Attention: Read list of letters (0/1) 1  Attention: Serial 7 subtraction starting at 100 (0/3) 3  Language: Repeat phrase (0/2) 2  Language : Fluency (0/1) 1  Abstraction (0/2) 0  Delayed Recall (0/5) 2  Orientation (0/6) 6  Total 23  Adjusted Score (based on education) 24        No data to display           Orientation:  Alert and oriented to person, place and time. No aphasia or dysarthria. Fund of knowledge is appropriate. Recent memory impaired and remote memory intact.  Attention and  concentration are normal.  Able to name objects and repeat phrases. Delayed recall 2/6 Cranial nerves: There is good facial symmetry. Extraocular muscles are intact and visual fields are full to confrontational testing. Speech is fluent and clear. No tongue deviation. Hearing is intact to conversational tone. Tone: Tone is good throughout. Sensation: Sensation is intact to light touch and pinprick throughout. Vibration is intact at the bilateral big toe.  Coordination: The patient has no difficulty with RAM's or FNF bilaterally. Normal finger to nose  Motor: Strength is 5/5 in the bilateral upper and lower extremities. There is no pronator drift. There are no fasciculations noted. DTR's: Deep tendon reflexes are 2/4 at the bilateral biceps, triceps, brachioradialis, patella and achilles.  Plantar responses are downgoing bilaterally. Gait and Station: The patient is able to ambulate without difficulty.The patient  having difficulty with heel toe walk .The patient is able to ambulate in a tandem fashion. The patient is able to stand in the Romberg position.     Thank you for allowing [place] the opportunity to participate in the care of this nice patient. Please do not hesitate to contact us for any questions or concerns.   Total time spent on today's visit was 60 minutes dedicated to this patient today, preparing to see patient, examining the patient, ordering tests and/or medications and counseling the patient, documenting clinical information in the EHR or other health record, independently interpreting results and communicating results to the patient/family, discussing treatment and goals, answering patient's questions and coordinating care.  Cc:  Ignatius Specking, MD  Marlowe Kays 04/24/2023 10:23 AM

## 2023-04-24 NOTE — Patient Instructions (Addendum)

## 2023-04-27 DIAGNOSIS — G4733 Obstructive sleep apnea (adult) (pediatric): Secondary | ICD-10-CM | POA: Diagnosis not present

## 2023-04-28 ENCOUNTER — Other Ambulatory Visit: Payer: Self-pay | Admitting: Physician Assistant

## 2023-04-28 ENCOUNTER — Telehealth: Payer: Self-pay | Admitting: Physician Assistant

## 2023-04-28 MED ORDER — DIAZEPAM 2 MG PO TABS: ORAL_TABLET | ORAL | 0 refills | Status: AC

## 2023-04-28 NOTE — Telephone Encounter (Signed)
Left message with the after hour service on 04-27-23 at 4:52 pm   Caller says that she just set up the MRI appt for her husband for June 20 th at 6:30 am. He will need something called in for the Claustrophobia

## 2023-04-28 NOTE — Telephone Encounter (Signed)
Sent to [facility] for Valium 2 mg assuming he is not allergic to it , let us know otherwise. Take 1 tab 30 minutes prior to MRI, may add an additional tab if needed

## 2023-04-28 NOTE — Telephone Encounter (Signed)
Advised Rx sent in and thanked me for calling

## 2023-05-06 DIAGNOSIS — Z9641 Presence of insulin pump (external) (internal): Secondary | ICD-10-CM | POA: Diagnosis not present

## 2023-05-06 DIAGNOSIS — Z978 Presence of other specified devices: Secondary | ICD-10-CM | POA: Diagnosis not present

## 2023-05-06 DIAGNOSIS — E785 Hyperlipidemia, unspecified: Secondary | ICD-10-CM | POA: Diagnosis not present

## 2023-05-06 DIAGNOSIS — E1065 Type 1 diabetes mellitus with hyperglycemia: Secondary | ICD-10-CM | POA: Diagnosis not present

## 2023-05-07 DIAGNOSIS — Z0000 Encounter for general adult medical examination without abnormal findings: Secondary | ICD-10-CM | POA: Diagnosis not present

## 2023-05-07 DIAGNOSIS — Z1339 Encounter for screening examination for other mental health and behavioral disorders: Secondary | ICD-10-CM | POA: Diagnosis not present

## 2023-05-07 DIAGNOSIS — Z299 Encounter for prophylactic measures, unspecified: Secondary | ICD-10-CM | POA: Diagnosis not present

## 2023-05-07 DIAGNOSIS — J449 Chronic obstructive pulmonary disease, unspecified: Secondary | ICD-10-CM | POA: Diagnosis not present

## 2023-05-07 DIAGNOSIS — E1165 Type 2 diabetes mellitus with hyperglycemia: Secondary | ICD-10-CM | POA: Diagnosis not present

## 2023-05-07 DIAGNOSIS — Z7189 Other specified counseling: Secondary | ICD-10-CM | POA: Diagnosis not present

## 2023-05-07 DIAGNOSIS — I739 Peripheral vascular disease, unspecified: Secondary | ICD-10-CM | POA: Diagnosis not present

## 2023-05-07 DIAGNOSIS — Z1389 Encounter for screening for other disorder: Secondary | ICD-10-CM | POA: Diagnosis not present

## 2023-05-07 DIAGNOSIS — I10 Essential (primary) hypertension: Secondary | ICD-10-CM | POA: Diagnosis not present

## 2023-05-07 DIAGNOSIS — Z1331 Encounter for screening for depression: Secondary | ICD-10-CM | POA: Diagnosis not present

## 2023-05-14 ENCOUNTER — Ambulatory Visit
Admission: RE | Admit: 2023-05-14 | Discharge: 2023-05-14 | Disposition: A | Discharge status: Home / Self Care | Payer: Medicare PPO | Source: Ambulatory Visit | Attending: Physician Assistant | Admitting: Physician Assistant

## 2023-05-14 DIAGNOSIS — R413 Other amnesia: Secondary | ICD-10-CM | POA: Diagnosis not present

## 2023-05-14 DIAGNOSIS — G9389 Other specified disorders of brain: Secondary | ICD-10-CM | POA: Diagnosis not present

## 2023-05-17 NOTE — Progress Notes (Signed)
MRI brain without acute findings, once again, it shows sequela of prior head trauma on the R forehead area, and chronic changes in the vessels given history of HTN, cholesterol and sugars, but similar to the MRI of 2021. Thanks

## 2023-05-18 DIAGNOSIS — E1065 Type 1 diabetes mellitus with hyperglycemia: Secondary | ICD-10-CM | POA: Diagnosis not present

## 2023-06-02 ENCOUNTER — Ambulatory Visit: Payer: Medicare PPO | Admitting: Physician Assistant

## 2023-06-02 ENCOUNTER — Encounter: Payer: Self-pay | Admitting: Physician Assistant

## 2023-06-02 VITALS — BP 124/73 | HR 93 | Resp 20 | Wt 200.0 lb

## 2023-06-02 DIAGNOSIS — R413 Other amnesia: Secondary | ICD-10-CM

## 2023-06-02 NOTE — Patient Instructions (Addendum)
It was a pleasure to see you today at our office.   Recommendations:  Neurocognitive evaluation at our office    Follow up in 6 months  Continue BiPAP for OSA  Continue taking B12      Whom to call: Memory  decline, memory medications: Call our office [phone]  For psychiatric meds, mood meds: Please have your primary care physician manage these medications.  If you have any severe symptoms of a stroke, or other severe issues such as confusion,severe chills or fever, etc call 911 or go to the ER as you may need to be evaluated further    RECOMMENDATIONS FOR ALL PATIENTS WITH MEMORY PROBLEMS: 1. Continue to exercise (Recommend 30 minutes of walking everyday, or 3 hours every week) 2. Increase social interactions - continue going to [place] and enjoy social gatherings with friends and family 3. Eat healthy, avoid fried foods and eat more fruits and vegetables 4. Maintain adequate blood pressure, blood sugar, and blood cholesterol level. Reducing the risk of stroke and cardiovascular disease also helps promoting better memory.    [facility] [phone] 5. Avoid stressful situations. Live a simple life and avoid aggravations. Organize your time and prepare for the next day in anticipation. 6. Sleep well, avoid any interruptions of sleep and avoid any distractions in the bedroom that may interfere with adequate sleep quality 7. Avoid sugar, avoid sweets as there is a strong link between excessive sugar intake, diabetes, and cognitive impairment We discussed the Mediterranean diet, which has been shown to help patients reduce the risk of progressive memory disorders and reduces cardiovascular risk. This includes eating fish, eat fruits and green leafy vegetables, nuts like almonds and hazelnuts, walnuts, and also use olive oil. Avoid fast foods and fried foods as much as possible. Avoid sweets and sugar as sugar use has been linked to worsening of memory function.  There is  always a concern of gradual progression of memory problems. If this is the case, then we may need to adjust level of care according to patient needs. Support, both to the patient and caregiver, should then be put into place.      You have been referred for a neuropsychological evaluation (i.e., evaluation of memory and thinking abilities). Please bring someone with you to this appointment if possible, as it is helpful for the doctor to hear from both you and another adult who knows you well. Please bring eyeglasses and hearing aids if you wear them.    The evaluation will take approximately 3 hours and has two parts:   The first part is a clinical interview with the neuropsychologist (Dr. Milbert Coulter or Dr. Roseanne Reno). During the interview, the neuropsychologist will speak with you and the individual you brought to the appointment.    The second part of the evaluation is testing with the doctor's technician Annabelle Harman or Selena Batten). During the testing, the technician will ask you to remember different types of material, solve problems, and answer some questionnaires. Your family member will not be present for this portion of the evaluation.   Please note: We must reserve several hours of the neuropsychologist's time and the psychometrician's time for your evaluation appointment. As such, there is a No-Show fee of $100. If you are unable to attend any of your appointments, please contact our office as soon as possible to reschedule.    FALL PRECAUTIONS: Be cautious when walking. Scan the area for obstacles that may increase the risk of trips and falls. When getting up  in the mornings, sit up at the edge of the bed for a few minutes before getting out of bed. Consider elevating the bed at the head end to avoid drop of blood pressure when getting up. Walk always in a well-lit room (use night lights in the walls). Avoid area rugs or power cords from appliances in the middle of the walkways. Use a walker or a cane if  necessary and consider physical therapy for balance exercise. Get your eyesight checked regularly.  FINANCIAL OVERSIGHT: Supervision, especially oversight when making financial decisions or transactions is also recommended.  HOME SAFETY: Consider the safety of the kitchen when operating appliances like stoves, microwave oven, and blender. Consider having supervision and share cooking responsibilities until no longer able to participate in those. Accidents with firearms and other hazards in the house should be identified and addressed as well.   ABILITY TO BE LEFT ALONE: If patient is unable to contact 911 operator, consider using LifeLine, or when the need is there, arrange for someone to stay with patients. Smoking is a fire hazard, consider supervision or cessation. Risk of wandering should be assessed by caregiver and if detected at any point, supervision and safe proof recommendations should be instituted.  MEDICATION SUPERVISION: Inability to self-administer medication needs to be constantly addressed. Implement a mechanism to ensure safe administration of the medications.   DRIVING: Regarding driving, in patients with progressive memory problems, driving will be impaired. We advise to have someone else do the driving if trouble finding directions or if minor accidents are reported. Independent driving assessment is available to determine safety of driving.   If you are interested in the driving assessment, you can contact the following:  [facility] [employer] [facility] in [place] [phone]  [facility] [phone]  [facility] [phone] or [phone]    Mediterranean Diet A Mediterranean diet refers to food and lifestyle choices that are based on the traditions of countries located on the [employer] [place]. This way of eating has been shown to help prevent certain conditions and improve outcomes for people who have  chronic diseases, like kidney disease and heart disease. What are tips for following this plan? Lifestyle  Cook and eat meals together with your family, when possible. Drink enough fluid to keep your urine clear or pale yellow. Be physically active every day. This includes: Aerobic exercise like running or swimming. Leisure activities like gardening, walking, or housework. Get 7-8 hours of sleep each night. If recommended by your health care provider, drink red wine in moderation. This means 1 glass a day for nonpregnant women and 2 glasses a day for men. A glass of wine equals 5 oz (150 mL). Reading food labels  Check the serving size of packaged foods. For foods such as rice and pasta, the serving size refers to the amount of cooked product, not dry. Check the total fat in packaged foods. Avoid foods that have saturated fat or trans fats. Check the ingredients list for added sugars, such as corn syrup. Shopping  At the grocery store, buy most of your food from the areas near the walls of the store. This includes: Fresh fruits and vegetables (produce). Grains, beans, nuts, and seeds. Some of these may be available in unpackaged forms or large amounts (in bulk). Fresh seafood. Poultry and eggs. Low-fat dairy products. Buy whole ingredients instead of prepackaged foods. Buy fresh fruits and vegetables in-season from local farmers markets. Buy frozen fruits and vegetables in resealable bags.  If you do not have access to quality fresh seafood, buy precooked frozen shrimp or canned fish, such as tuna, salmon, or sardines. Buy small amounts of raw or cooked vegetables, salads, or olives from the deli or salad bar at your store. Stock your pantry so you always have certain foods on hand, such as olive oil, canned tuna, canned tomatoes, rice, pasta, and beans. Cooking  Cook foods with extra-virgin olive oil instead of using butter or other vegetable oils. Have meat as a side dish, and have  vegetables or grains as your main dish. This means having meat in small portions or adding small amounts of meat to foods like pasta or stew. Use beans or vegetables instead of meat in common dishes like chili or lasagna. Experiment with different cooking methods. Try roasting or broiling vegetables instead of steaming or sauteing them. Add frozen vegetables to soups, stews, pasta, or rice. Add nuts or seeds for added healthy fat at each meal. You can add these to yogurt, salads, or vegetable dishes. Marinate fish or vegetables using olive oil, lemon juice, garlic, and fresh herbs. Meal planning  Plan to eat 1 vegetarian meal one day each week. Try to work up to 2 vegetarian meals, if possible. Eat seafood 2 or more times a week. Have healthy snacks readily available, such as: Vegetable sticks with hummus. Greek yogurt. Fruit and nut trail mix. Eat balanced meals throughout the week. This includes: Fruit: 2-3 servings a day Vegetables: 4-5 servings a day Low-fat dairy: 2 servings a day Fish, poultry, or lean meat: 1 serving a day Beans and legumes: 2 or more servings a week Nuts and seeds: 1-2 servings a day Whole grains: 6-8 servings a day Extra-virgin olive oil: 3-4 servings a day Limit red meat and sweets to only a few servings a month What are my food choices? Mediterranean diet Recommended Grains: Whole-grain pasta. Brown rice. Bulgar wheat. Polenta. Couscous. Whole-wheat bread. Orpah Cobb. Vegetables: Artichokes. Beets. Broccoli. Cabbage. Carrots. Eggplant. Green beans. Chard. Kale. Spinach. Onions. Leeks. Peas. Squash. Tomatoes. Peppers. Radishes. Fruits: Apples. Apricots. Avocado. Berries. Bananas. Cherries. Dates. Figs. Grapes. Lemons. Melon. Oranges. Peaches. Plums. Pomegranate. Meats and other protein foods: Beans. Almonds. Sunflower seeds. Pine nuts. Peanuts. Cod. Salmon. Scallops. Shrimp. Tuna. Tilapia. Clams. Oysters. Eggs. Dairy: Low-fat milk. Cheese. Greek  yogurt. Beverages: Water. Red wine. Herbal tea. Fats and oils: Extra virgin olive oil. Avocado oil. Grape seed oil. Sweets and desserts: [place] yogurt with honey. Baked apples. Poached pears. Trail mix. Seasoning and other foods: Basil. Cilantro. Coriander. Cumin. Mint. Parsley. Sage. Rosemary. Tarragon. Garlic. Oregano. Thyme. Pepper. Balsalmic vinegar. Tahini. Hummus. Tomato sauce. Olives. Mushrooms. Limit these Grains: Prepackaged pasta or rice dishes. Prepackaged cereal with added sugar. Vegetables: Deep fried potatoes (french fries). Fruits: Fruit canned in syrup. Meats and other protein foods: Beef. Pork. Lamb. Poultry with skin. Hot dogs. Tomasa Blase. Dairy: Ice cream. Sour cream. Whole milk. Beverages: Juice. Sugar-sweetened soft drinks. Beer. Liquor and spirits. Fats and oils: Butter. Canola oil. Vegetable oil. Beef fat (tallow). Lard. Sweets and desserts: Cookies. Cakes. Pies. Candy. Seasoning and other foods: Mayonnaise. Premade sauces and marinades. The items listed may not be a complete list. Talk with your dietitian about what dietary choices are right for you. Summary The Mediterranean diet includes both food and lifestyle choices. Eat a variety of fresh fruits and vegetables, beans, nuts, seeds, and whole grains. Limit the amount of red meat and sweets that you eat. Talk with your health care provider about whether it  is safe for you to drink red wine in moderation. This means 1 glass a day for nonpregnant women and 2 glasses a day for men. A glass of wine equals 5 oz (150 mL). This information is not intended to replace advice given to you by your health care provider. Make sure you discuss any questions you have with your health care provider. Document Released: 07/03/2016 Document Revised: 08/05/2016 Document Reviewed: 07/03/2016 Elsevier Interactive Patient Education  2017 [employer].

## 2023-06-02 NOTE — Progress Notes (Signed)
Assessment/Plan:   Memory impairment of unclear etiology.  Paul Webb is a very pleasant 67 y.o. RH male  with a history of hypertension, hyperlipidemia, circadian rhythm sleep disturbance, GAD, MDD, OSA on BiPAP, DM2 seen today in follow up to discuss the MRI of the brain results. 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  No age advanced or lobar predominant parenchymal atrophy. He was last seen on 04/24/23 with [facility] 24/30. Patient is not currently on antidementia medications, and unless indicated by Neuropsychological report, will hold prescribing it.   This patient is accompanied in the office by his wife  who supplements the history.  Previous records as well as any outside records available were reviewed prior to todays visit.     Follow up in 6  months. Patient is scheduled for Neuropsych testing in 06/2023  for clarity of diagnosis and disease trajectory   Continue B12 supplements  Continue to control mood as per PCP. He is on Abilify, Zoloft, Wellbutrin. Control polypharmacy as it may contribute to memory changes. Continue psychotherapy for anxiety and depression Continue BiPAP for OSA  Recommend good control of cardiovascular risk factors   Initial visit 04/24/23  How long did patient have memory difficulties? About 5 years, gradually worse.  He continues to have word finding difficulties.  Patient denies  difficulty remembering recent conversations and people names.  Sometimes he says one thing but he thinks another.  He reports decreased attention and concentration, sometimes his  "mind wanders ". repeats oneself? Denies  Disoriented when walking into a room?  Patient denies   Leaving objects in  unusual places? denies   Wandering behavior?  denies   Any personality changes?  Patient denies   Any history of depression?:  Endorsed.  He has a history of anxiety and depression.  This has been present for over 30 years, after the death of his first wife.  He is under the care of psychiatry but he is not on psychotherapy. Hallucinations or paranoia?  Patient denies   Seizures?   Patient denies    Any sleep changes?  Sleeps well, Denies vivid dreams, REM behavior or sleepwalking   Sleep apnea?  Endorsed, he is on BiPAP, followed by [facility], could not tolerate CPAP  Any hygiene concerns?  Patient denies   Independent of bathing and dressing?  Endorsed  Does the patient needs help with medications? Wife is in charge   Who is in charge of the finances?  Wife is in charge     Any changes in appetite?  Denies, He is on Mounjaro and has lost 30 lbs in 8 months planning to lose another 10 pounds.      Patient have trouble swallowing? denies   Does the patient cook?  "Not a lot " Any kitchen accidents such as leaving the stove on? denies   Any headaches?   denies   Chronic back pain ? denies   Recent falls or head injuries? Periodically he stumbles and may sustain a mechanical fall. He does not stumble on flat surfaces . Has a history of L knee arthritis and peripheral neuropathy due to diabetes.  Vision changes? denies   Unilateral weakness, numbness or tingling?  He has peripheral neuropathy due to diabetes. Any tremors?  He reports intermittent tremors bilaterally  when performing fine motor actions, such as using a screwdriver for example.  However, if he holds a heavier object he does not have tremors.  Any anosmia?  denies   Any incontinence of urine? denies   Any bowel dysfunction? denies      Patient lives with his wife  History of heavy alcohol intake? denies   History of heavy tobacco use? denies   Family history of dementia? He was adopted.  Does patient drive?  Every once in a while he  may hesitate or give his wife the wrong direction .    Prior MRI of the brain 11/20/2020 showed minimal chronic small vessel ischemia as well as a remote inferior right frontal insult.  He had normal volume, without atrophy.   Retired [employer] architect.  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    CURRENT MEDICATIONS:  Outpatient Encounter Medications as of 06/02/2023  Medication Sig   aspirin EC 81 MG tablet Take 1 tablet (81 mg total) by mouth daily.   atenolol (TENORMIN) 50 MG tablet Take 50 mg by mouth at bedtime.    BREO ELLIPTA 100-25 MCG/INH AEPB Inhale 1 puff into the lungs daily.   buPROPion (WELLBUTRIN XL) 300 MG 24 hr tablet Take 1 tablet (300 mg total) by mouth daily.   cetirizine (ZYRTEC) 10 MG tablet Take 10 mg by mouth at bedtime.    Cholecalciferol (VITAMIN D) 2000 units tablet Take 2,000 Units by mouth daily.   diazepam (VALIUM) 2 MG tablet Take 1 tab 30 minutes prior to MRI, may add an additional tab if needed   hydrOXYzine (ATARAX) 25 MG tablet Take 25 mg by mouth daily as needed.   levofloxacin (LEVAQUIN) 500 MG tablet    lisinopril-hydrochlorothiazide (PRINZIDE,ZESTORETIC) 20-25 MG tablet Take 1 tablet by mouth daily.   meloxicam (MOBIC) 15 MG tablet Take 15 mg by mouth daily.   MOUNJARO 2.5 MG/0.5ML Pen SMARTSIG:0.5 Milliliter(s) SUB-Q Once a Week   Multiple Vitamin (MULTIVITAMIN) tablet Take 1 tablet by mouth daily.   NOVOLOG 100 UNIT/ML injection Inject into the skin.   omeprazole (PRILOSEC) 40 MG capsule Take 40 mg by mouth daily.   pravastatin (PRAVACHOL) 80 MG tablet Take 80 mg by mouth at bedtime.    sertraline (ZOLOFT) 100 MG tablet Take 1.5 tablets (150 mg total) by mouth daily.   traZODone (DESYREL) 100 MG tablet Take 1 tablet (100 mg total) by mouth at bedtime as needed.   No facility-administered encounter medications on file as of 06/02/2023.        No data to display             04/24/2023    9:00 AM  Montreal Cognitive Assessment   Visuospatial/ Executive (0/5) 3  Naming (0/3) 3  Attention: Read list of digits (0/2) 2  Attention: Read list of letters (0/1) 1  Attention: Serial 7 subtraction starting at 100 (0/3) 3  Language: Repeat phrase (0/2) 2  Language : Fluency (0/1) 1  Abstraction (0/2) 0  Delayed Recall (0/5) 2  Orientation (0/6) 6  Total 23  Adjusted Score (based on education) 24   Thank you for allowing [place] the opportunity to participate in the care of this nice patient. Please do not hesitate to contact us for any questions or concerns.   Total time spent on today's visit was 36 minutes dedicated to this patient today, preparing to see patient, examining the patient, ordering  tests and/or medications and counseling the patient, documenting clinical information in the EHR or other health record, independently interpreting results and communicating results to the patient/family, discussing treatment and goals, answering patient's questions and coordinating care.  Cc:  Ignatius Specking, MD  Marlowe Kays 06/02/2023 6:48 AM

## 2023-06-08 ENCOUNTER — Ambulatory Visit: Payer: Medicare PPO | Admitting: Physician Assistant

## 2023-06-18 ENCOUNTER — Ambulatory Visit: Payer: Medicare PPO | Admitting: Orthopedic Surgery

## 2023-06-18 ENCOUNTER — Other Ambulatory Visit (INDEPENDENT_AMBULATORY_CARE_PROVIDER_SITE_OTHER): Payer: Medicare PPO

## 2023-06-18 VITALS — BP 117/74 | HR 74 | Ht 70.0 in | Wt 197.0 lb

## 2023-06-18 DIAGNOSIS — M25562 Pain in left knee: Secondary | ICD-10-CM

## 2023-06-18 DIAGNOSIS — G8929 Other chronic pain: Secondary | ICD-10-CM | POA: Diagnosis not present

## 2023-06-18 DIAGNOSIS — M1712 Unilateral primary osteoarthritis, left knee: Secondary | ICD-10-CM

## 2023-06-18 MED ORDER — DICLOFENAC POTASSIUM 50 MG PO TABS
50.0000 mg | ORAL_TABLET | Freq: Two times a day (BID) | ORAL | 3 refills | Status: DC
Start: 2023-06-18 — End: 2023-09-30

## 2023-06-18 NOTE — Patient Instructions (Signed)
[id number]. Call Dexcom for replacement

## 2023-06-18 NOTE — Progress Notes (Signed)
Patient: Paul Webb           Date of Birth: July 03, 1956           MRN: [id number] Visit Date: 06/18/2023 Requested by: Ignatius Specking, MD [address] [place],  [place] [address] PCP: Ignatius Specking, MD    Chief Complaint  Patient presents with   Knee Pain    Left knee     67 year old male with left knee pain over the medial aspect.  He says when he is going up and down the stairs he gets a little pain and feels like the knee wants to give way.  He denies any trauma.  He had a right tibial nailing and had a rotator cuff repair in the past  He is already on 50 mg of Mobic although he is not sure what it is for is not helping his knee pain    Body mass index is 28.27 kg/m.   Problem list, medical hx, medications and allergies reviewed   ROS no problems at this time his chart indicates he had some memory loss and sees neurology   No Known Allergies  BP 117/74   Pulse 74   Ht 5\' 10"  (1.778 m)   Wt 197 lb (89.4 kg)   BMI 28.27 kg/m    Physical exam: Physical Exam Vitals and nursing note reviewed.  Constitutional:      Appearance: Normal appearance.  HENT:     Head: Normocephalic and atraumatic.  Eyes:     General: No scleral icterus.       Right eye: No discharge.        Left eye: No discharge.     Extraocular Movements: Extraocular movements intact.     Conjunctiva/sclera: Conjunctivae normal.     Pupils: Pupils are equal, round, and reactive to light.  Cardiovascular:     Rate and Rhythm: Normal rate.     Pulses: Normal pulses.  Skin:    General: Skin is warm and dry.     Capillary Refill: Capillary refill takes less than 2 seconds.  Neurological:     General: No focal deficit present.     Mental Status: He is alert and oriented to person, place, and time.     Gait: Gait normal.  Psychiatric:        Mood and Affect: Mood normal.        Behavior: Behavior normal.        Thought Content: Thought content normal.        Judgment: Judgment normal.      MSK:  Left knee is tender over the anteromedial tibia shaft not over the femur McMurray's sign negative joint line nontender no effusion range of motion is full ligaments are stable skin is intact no erythema no warmth to the joint  Data reviewed:   X-rays from our office show perhaps grade 1 arthritis no osteophytes  Assessment and plan:  Encounter Diagnoses  Name Primary?   Chronic pain of left knee    Primary osteoarthritis of left knee Yes    67 year old male anteromedial knee pain seems to be on the tibial plateau versus the actual joint line.  Recommend diclofenac 50 twice a day and an injection and then return in a month and see if he needs an MRI   Meds ordered this encounter  Medications   diclofenac (CATAFLAM) 50 MG tablet    Sig: Take 1 tablet (50 mg total) by mouth 2 (two) times daily.  Dispense:  60 tablet    Refill:  3    Procedures:   Yes   Procedure note left knee injection   verbal consent was obtained to inject left knee joint  Timeout was completed to confirm the site of injection  The medications used were depomedrol 40 mg and 1% lidocaine 3 cc Anesthesia was provided by ethyl chloride and the skin was prepped with alcohol.  After cleaning the skin with alcohol a 20-gauge needle was used to inject the left knee joint. There were no complications. A sterile bandage was applied.

## 2023-06-19 NOTE — Progress Notes (Unsigned)
BH MD/PA/NP OP Progress Note  06/22/2023 1:42 PM Paul Webb  MRN:  [id number]  Chief Complaint:  Chief Complaint  Patient presents with   Follow-up   HPI:  According to the chart review, the following events have occurred since the last visit: The patient was seen by neurology for memory loss. MOCA 03/2023 is reportedly 24/30 Head MRI 04/2023 IMPRESSION: 1.  No evidence of an acute intracranial abnormality. 2. Redemonstrated small focus of chronic encephalomalacia/gliosis within the anteroinferior right frontal lobe, likely posttraumatic in etiology. 3. Multifocal T2 FLAIR hyperintense signal abnormality within the cerebral white matter, overall mild but greater than expected for age. Findings are similar to the prior brain MRI of 11/20/2020. These signal changes are nonspecific, but likely reflect chronic small vessel ischemic disease given the patient's history of hypertension, hyperlipidemia and diabetes mellitus.   This is a follow-up appointment for depression and insomnia.  He states that he notices himself being more irritable. He may raise the voice, although he denies yelling or HI.  He has been this way for many years, although it has gotten much better compared to a few years ago since being on the medication.  He was irritable when he could not set up stairs, which he has done in the past.  He agrees that he has been recognizing memory issues, although he was reassured by his neurologist that it has been unchanged.  Although he is not opposed to seeing a therapist to learn skills, he would like to talk with his wife about this first.  He sleeps well, being on BiPAP machine.  He denies feeling depressed.  He may feel anxious at times.  He denies SI.  He asks if any of his medication can cause imbalance issues.  Discussed with the patient that trazodone, hydroxyzine (he rarely takes this), sertraline  can cause it, although it is more related to drowsiness.  He denies any drowsiness.   He feels comfortable to stay on the current medication regimen at this time.   Wt Readings from Last 3 Encounters:  06/22/23 198 lb 12.8 oz (90.2 kg)  06/18/23 197 lb (89.4 kg)  06/02/23 200 lb (90.7 kg)   12/29/22 214 lb (97.1 kg)  10/06/22 216 lb 9.6 oz (98.2 kg)  07/10/22 233 lb 6.4 oz (105.9 kg)     Visit Diagnosis:    ICD-10-CM   1. MDD (major depressive disorder), recurrent episode, moderate (HCC)  F33.1     2. Insomnia, unspecified type  G47.00       Past Psychiatric History: Please see initial evaluation for full details. I have reviewed the history. No updates at this time.     Past Medical History:  Past Medical History:  Diagnosis Date   Arthritis    Circadian rhythm sleep disturbance 09/05/2019   Essential hypertension 07/30/2012   Fast heart beat    Gastro-esophageal reflux disease with esophagitis 07/30/2012   Generalized anxiety disorder 05/12/2017   GERD (gastroesophageal reflux disease)    History of kidney stones    Hyperlipidemia 07/30/2012   Major depressive disorder    OSA (obstructive sleep apnea)    does not use CPAP   Type 2 diabetes mellitus 07/30/2012    Past Surgical History:  Procedure Laterality Date   COLONOSCOPY     COLONOSCOPY N/A 10/06/2018   Procedure: COLONOSCOPY;  Surgeon: Malissa Hippo, MD;  [facility];  Service: Endoscopy;  Laterality: N/A;  1:25   DRUG INDUCED ENDOSCOPY N/A 02/27/2021  Procedure: DRUG INDUCED ENDOSCOPY;  Surgeon: Osborn Coho, MD;  Location: [facility];  Service: ENT;  Laterality: N/A;   FL INJ RT KNEE CT ARTHROGRAM ([facility] HX)     NASAL SEPTOPLASTY W/ TURBINOPLASTY Bilateral 02/27/2021   Procedure: NASAL SEPTOPLASTY WITH TURBINATE REDUCTION;  Surgeon: Osborn Coho, MD;  Location: [facility];  Service: ENT;  Laterality: Bilateral;   POLYPECTOMY  10/06/2018   Procedure: POLYPECTOMY;  Surgeon: Malissa Hippo, MD;  Location: [facility];  Service: Endoscopy;;   colon    Rod in lower leg     trauma   Rt hernia repair     SHOULDER ARTHROSCOPY Right 04/16/2021   Procedure: RIGHT SHOULDER ARTHOROSCOPY AND DEBRIDEMENT;  Surgeon: Vickki Hearing, MD;  Location: [facility];  Service: Orthopedics;  Laterality: Right;    Family Psychiatric History: Please see initial evaluation for full details. I have reviewed the history. No updates at this time.     Family History:  Family History  Adopted: Yes  Family history unknown: Yes    Social History:  Social History   Socioeconomic History   Marital status: Married    Spouse name: Paul Webb   Number of children: Not on file   Years of education: 12   Highest education level: High school graduate  Occupational History   Occupation: Retired  Tobacco Use   Smoking status: Never   Smokeless tobacco: Never  Vaping Use   Vaping status: Never Used  Substance and Sexual Activity   Alcohol use: Never   Drug use: Never   Sexual activity: Not on file  Other Topics Concern   Not on file  Social History Narrative   Right Handed   Lives in one story with a basement    Drinks Caffeine    Lives with wife   retired   Chief Executive Officer Determinants of Corporate investment banker Strain: Not on file  Food Insecurity: Not on file  Transportation Needs: Not on file  Physical Activity: Not on file  Stress: Not on file  Social Connections: Not on file    Allergies: No Known Allergies  Metabolic Disorder Labs: Lab Results  Component Value Date   HGBA1C 6.4 (H) 04/15/2021   MPG 136.98 04/15/2021   No results found for: "PROLACTIN" No results found for: "CHOL", "TRIG", "HDL", "CHOLHDL", "VLDL", "LDLCALC" No results found for: "TSH"  Therapeutic Level Labs: No results found for: "LITHIUM" No results found for: "VALPROATE" No results found for: "CBMZ"  Current Medications: Current Outpatient Medications  Medication Sig Dispense Refill   aspirin EC 81 MG tablet Take 1 tablet (81 mg total) by mouth daily.      atenolol (TENORMIN) 50 MG tablet Take 50 mg by mouth at bedtime.      BREO ELLIPTA 100-25 MCG/INH AEPB Inhale 1 puff into the lungs daily.     cetirizine (ZYRTEC) 10 MG tablet Take 10 mg by mouth at bedtime.     Cholecalciferol (VITAMIN D) 2000 units tablet Take 2,000 Units by mouth daily.     cyanocobalamin (VITAMIN B12) 1000 MCG tablet Take 1,000 mcg by mouth daily.     diazepam (VALIUM) 2 MG tablet Take 1 tab 30 minutes prior to MRI, may add an additional tab if needed 2 tablet 0   diclofenac (CATAFLAM) 50 MG tablet Take 1 tablet (50 mg total) by mouth 2 (two) times daily. 60 tablet 3   hydrOXYzine (ATARAX) 25 MG tablet Take 25 mg by mouth daily  as needed.     lisinopril-hydrochlorothiazide (PRINZIDE,ZESTORETIC) 20-25 MG tablet Take 1 tablet by mouth daily.     MOUNJARO 2.5 MG/0.5ML Pen SMARTSIG:0.5 Milliliter(s) SUB-Q Once a Week     Multiple Vitamin (MULTIVITAMIN) tablet Take 1 tablet by mouth daily.     NOVOLOG 100 UNIT/ML injection Inject into the skin.     omeprazole (PRILOSEC) 40 MG capsule Take 40 mg by mouth daily.     pravastatin (PRAVACHOL) 80 MG tablet Take 80 mg by mouth at bedtime.      [START ON 07/03/2023] buPROPion (WELLBUTRIN XL) 300 MG 24 hr tablet Take 1 tablet (300 mg total) by mouth daily. 30 tablet 4   [START ON 07/04/2023] sertraline (ZOLOFT) 100 MG tablet Take 1.5 tablets (150 mg total) by mouth daily. 45 tablet 5   [START ON 06/28/2023] traZODone (DESYREL) 100 MG tablet Take 1 tablet (100 mg total) by mouth at bedtime as needed. 30 tablet 1   No current facility-administered medications for this visit.     Musculoskeletal: Strength & Muscle Tone: within normal limits Gait & Station: normal Patient leans: N/A  Psychiatric Specialty Exam: Review of Systems  Psychiatric/Behavioral:  Negative for agitation, behavioral problems, confusion, decreased concentration, dysphoric mood, hallucinations, self-injury, sleep disturbance and suicidal ideas. The patient is not  nervous/anxious and is not hyperactive.   All other systems reviewed and are negative.   Blood pressure 121/73, pulse 70, temperature 98.4 F (36.9 C), temperature source Skin, height 5\' 10"  (1.778 m), weight 198 lb 12.8 oz (90.2 kg).Body mass index is 28.52 kg/m.  General Appearance: Fairly Groomed  Eye Contact:  Good  Speech:  Clear and Coherent  Volume:  Normal  Mood:   irritable  Affect:  Appropriate, Congruent, and calm  Thought Process:  Coherent  Orientation:  Full (Time, Place, and Person)  Thought Content: Logical   Suicidal Thoughts:  No  Homicidal Thoughts:  No  Memory:  Immediate;   Good  Judgement:  Good  Insight:  Good  Psychomotor Activity:  Normal  Concentration:  Concentration: Good and Attention Span: Good  Recall:  Good  Fund of Knowledge: Good  Language: Good  Akathisia:  No  Handed:  Right  AIMS (if indicated): not done  Assets:  Communication Skills Desire for Improvement  ADL's:  Intact  Cognition: WNL  Sleep:  Good   Screenings: GAD-7    Flowsheet Row Office Visit from 12/29/2022 in [facility] Office Visit from 10/06/2022 in [facility] Office Visit from 07/10/2022 in [facility]  Total GAD-7 Score 0 3 12      PHQ2-9    Flowsheet Row Office Visit from 12/29/2022 in [facility] Office Visit from 10/06/2022 in [facility] Office Visit from 07/10/2022 in [facility] Video Visit from 12[facility]17/2021 in [facility] Video Visit from 08/07/2021 in [facility]  PHQ-2 Total Score 0 0 0 0 0      Flowsheet Row Office Visit from 12/29/2022 in [facility] Office Visit from 10/06/2022 in [name] Admission (Discharged) from 04/16/2021 in [place] [facility]  C-SSRS RISK CATEGORY No Risk No Risk No Risk        Assessment and Plan:  EMRE HUBBY is a 67 y.o. year old male with a history  of depression, anxiety,  diabetes, hyperlipidemia, severe sleep apnea (on BiPAP), who presents for follow up appointment for below.   1. MDD (major depressive disorder), recurrent episode, moderate (HCC) Acute stressors include: memory loss Other stressors include: retired,previously unemployed due to being in insulin pump   History: previously on venlafaxine for anxiety for 20 years, worsened after unemployment  (on current medication regimen since April 2021) Although he denies depressive symptoms, he reports slight worsening in irritability in the setting of stressors as above.  After having discussions, he will consider starting therapy after speaking with his wife.  Will continue sertraline and bupropion to target depression.  Will continue hydroxyzine as needed for anxiety.   2. Insomnia, unspecified type - he uses CPAP machine Improving.  Will continue current dose of trazodone as needed for insomnia.     Plan Continue sertraline 150 mg daily  Continue bupropion 300 mg daily  Continue Trazodone 100 mg at night as needed for insomnia Continue hydroxyzine 25 mg daily as needed for anxiety- he declined refill Consider referral to therapy  Next appointment: 9/19 at 1 pm  n.  He is not interested in transferring to [place] office anymore.   Past trials of medication: ?lexapro, venlafaxine, Abilify   The patient demonstrates the following risk factors for suicide: Chronic risk factors for suicide include: psychiatric disorder of depression. Acute risk factors for suicide include: family or marital conflict and loss (financial, interpersonal, professional). Protective factors for this patient include: coping skills and hope for the future. Considering  these factors, the overall suicide risk at this point appears to be low. Patient is appropriate for outpatient follow up.    Collaboration of Care: Collaboration of Care: Other reviewed notes in Epic  Patient/Guardian was advised Release of Information must be obtained prior to any record release in order to collaborate their care with an outside provider. Patient/Guardian was advised if they have not already done so to contact the registration department to sign all necessary forms in order for [place] to release information regarding their care.   Consent: Patient/Guardian gives verbal consent for treatment and assignment of benefits for services provided during this visit. Patient/Guardian expressed understanding and agreed to proceed.    Neysa Hotter, MD 06/22/2023, 1:42 PM

## 2023-06-22 ENCOUNTER — Encounter: Payer: Self-pay | Admitting: Psychiatry

## 2023-06-22 ENCOUNTER — Ambulatory Visit (INDEPENDENT_AMBULATORY_CARE_PROVIDER_SITE_OTHER): Payer: Medicare PPO | Admitting: Psychiatry

## 2023-06-22 VITALS — BP 121/73 | HR 70 | Temp 98.4°F | Ht 70.0 in | Wt 198.8 lb

## 2023-06-22 DIAGNOSIS — G4700 Insomnia, unspecified: Secondary | ICD-10-CM

## 2023-06-22 DIAGNOSIS — F331 Major depressive disorder, recurrent, moderate: Secondary | ICD-10-CM

## 2023-06-22 MED ORDER — TRAZODONE HCL 100 MG PO TABS: 100.0000 mg | ORAL_TABLET | Freq: Every evening | ORAL | 1 refills | Status: DC | PRN

## 2023-06-22 MED ORDER — SERTRALINE HCL 100 MG PO TABS: 150.0000 mg | ORAL_TABLET | Freq: Every day | ORAL | 5 refills | Status: DC

## 2023-06-22 MED ORDER — BUPROPION HCL ER (XL) 300 MG PO TB24: 300.0000 mg | ORAL_TABLET | Freq: Every day | ORAL | 4 refills | Status: DC

## 2023-06-22 NOTE — Patient Instructions (Signed)
Continue sertraline 150 mg daily  Continue bupropion 300 mg daily  Continue Trazodone 100 mg at night as needed for insomnia Continue hydroxyzine 25 mg daily as needed for anxiety Consider referral to therapy  Next appointment: 9/19 at 1 pm

## 2023-07-09 DIAGNOSIS — E1065 Type 1 diabetes mellitus with hyperglycemia: Secondary | ICD-10-CM | POA: Diagnosis not present

## 2023-07-20 ENCOUNTER — Encounter: Payer: Self-pay | Admitting: Orthopedic Surgery

## 2023-07-20 ENCOUNTER — Ambulatory Visit: Payer: Medicare PPO | Admitting: Orthopedic Surgery

## 2023-07-20 VITALS — BP 133/76 | HR 65 | Ht 71.0 in | Wt 201.0 lb

## 2023-07-20 DIAGNOSIS — M1712 Unilateral primary osteoarthritis, left knee: Secondary | ICD-10-CM

## 2023-07-20 DIAGNOSIS — M23322 Other meniscus derangements, posterior horn of medial meniscus, left knee: Secondary | ICD-10-CM | POA: Diagnosis not present

## 2023-07-20 NOTE — Progress Notes (Signed)
   VISIT TYPE: FOLLOW UP   Chief Complaint  Patient presents with   Knee Pain    FU lt knee had injection last visit still having issues and pain at times in a specific spot if walking down stairs and yard work     Encounter Diagnosis  Name Primary?   Primary osteoarthritis of left knee Yes    Assessment and Plan:   Encounter Diagnoses  Name Primary?   Primary osteoarthritis of left knee    Derangement of posterior horn of medial meniscus of left knee Yes   Mr. Calderone is still having issues with the knee although it is some better.  It is episodic it depends on what he is doing.  He does a lot of remodeling so at times the knee will feel like it wants to give out but never does.  Still having difficulty going up and down steps however, he would like to see how things go over the next month or so and call me if the knee gets worse   Prior treatment:   67 year old male anteromedial knee pain seems to be on the tibial plateau versus the actual joint line. Recommend diclofenac 50 twice a day and an injection and then return in a month and see if he needs an MRI    HPI:  67 year old male with left knee pain over the medial aspect.  He says when he is going up and down the stairs he gets a little pain and feels like the knee wants to give way.  He denies any trauma.  He had a right tibial nailing and had a rotator cuff repair in the past   He is already on 50 mg of Mobic although he is not sure what it is for is not helping his knee pain    BP 133/76   Pulse 65   Ht 5\' 11"  (1.803 m)   Wt 201 lb (91.2 kg)   BMI 28.03 kg/m   Ortho Exam   Imaging no new imaging  A/P Encounter Diagnosis  Name Primary?   Primary osteoarthritis of left knee Yes    No orders of the defined types were placed in this encounter.

## 2023-07-23 ENCOUNTER — Ambulatory Visit (INDEPENDENT_AMBULATORY_CARE_PROVIDER_SITE_OTHER): Payer: Medicare PPO | Admitting: Psychology

## 2023-07-23 ENCOUNTER — Ambulatory Visit: Payer: Self-pay

## 2023-07-23 DIAGNOSIS — R4189 Other symptoms and signs involving cognitive functions and awareness: Secondary | ICD-10-CM

## 2023-07-23 NOTE — Progress Notes (Signed)
   NEUROPSYCHOLOGICAL EVALUATION [facility] Department of Neurology  Date of Evaluation: 07/23/2023     Mr. Mussett called the morning of his appointment to cancel due to the acute onset of an illness (COVID-19). His evaluation was rescheduled for 08/10/2023 at 1:00pm pending symptom resolution.

## 2023-07-29 DIAGNOSIS — G4733 Obstructive sleep apnea (adult) (pediatric): Secondary | ICD-10-CM | POA: Diagnosis not present

## 2023-07-31 ENCOUNTER — Encounter: Payer: Medicare PPO | Admitting: Psychology

## 2023-08-09 NOTE — Progress Notes (Signed)
No show

## 2023-08-10 ENCOUNTER — Ambulatory Visit: Payer: Medicare PPO | Admitting: Psychology

## 2023-08-10 ENCOUNTER — Encounter: Payer: Self-pay | Admitting: Psychology

## 2023-08-10 DIAGNOSIS — F411 Generalized anxiety disorder: Secondary | ICD-10-CM | POA: Diagnosis not present

## 2023-08-10 DIAGNOSIS — R4189 Other symptoms and signs involving cognitive functions and awareness: Secondary | ICD-10-CM | POA: Diagnosis not present

## 2023-08-10 DIAGNOSIS — R251 Tremor, unspecified: Secondary | ICD-10-CM

## 2023-08-10 NOTE — Progress Notes (Signed)
   Psychometrician Note   Cognitive testing was administered to Paul Webb by Wallace Keller, B.S. (psychometrist) under the supervision of Dr. Newman Nickels, Ph.D., licensed psychologist on 08/10/2023. Paul Webb did not appear overtly distressed by the testing session per behavioral observation or responses across self-report questionnaires. Rest breaks were offered.    The battery of tests administered was selected by Dr. Newman Nickels, Ph.D. with consideration to Paul Webb current level of functioning, the nature of his symptoms, emotional and behavioral responses during interview, level of literacy, observed level of motivation/effort, and the nature of the referral question. This battery was communicated to the psychometrist. Communication between Dr. Newman Nickels, Ph.D. and the psychometrist was ongoing throughout the evaluation and Dr. Newman Nickels, Ph.D. was immediately accessible at all times. Dr. Newman Nickels, Ph.D. provided supervision to the psychometrist on the date of this service to the extent necessary to assure the quality of all services provided.    Paul Webb will return within approximately 1-2 weeks for an interactive feedback session with Dr. Milbert Coulter at which time his test performances, clinical impressions, and treatment recommendations will be reviewed in detail. Paul Webb understands he can contact our office should he require our assistance before this time.  A total of 180 minutes of billable time were spent face-to-face with Paul Webb by the psychometrist. This includes both test administration and scoring time. Billing for these services is reflected in the clinical report generated by Dr. Newman Nickels, Ph.D.  This note reflects time spent with the psychometrician and does not include test scores or any clinical interpretations made by Dr. Milbert Coulter. The full report will follow in a separate note.

## 2023-08-10 NOTE — Progress Notes (Unsigned)
NEUROPSYCHOLOGICAL EVALUATION [place] Department of Neurology  Date of Evaluation: August 10, 2023  Reason for Referral:   Paul Webb is a 67 y.o. right-handed Caucasian male referred by Marlowe Kays, PA-C, to characterize his current cognitive functioning and assist with diagnostic clarity and treatment planning in the context of concern for progressive cognitive decline.   Assessment and Plan:   Clinical Impression(s): Paul Webb' pattern of performance is suggestive of neuropsychological functioning largely within normal limits relative to age-matched peers. Some performance variability was exhibited across executive functioning. He also exhibited an isolated impairment across a clock drawing task (numbers were placed in counter-clockwise fashion with incorrect hand placement); however, all other visuospatial tasks were normatively appropriate. Performances were also appropriate relative to age-matched peers across processing speed, attention/concentration, safety/judgment, receptive and expressive language, and all aspects of learning and memory. Paul Webb denied difficulties completing instrumental activities of daily living (ADLs) independently. I do not believe that he meets diagnostic criteria for a neurocognitive disorder at the present time.  Relative to his previous evaluation in January 2022, stability was exhibited across nearly all assessed cognitive domains. An improvement was exhibited across attention/concentration. A more mild improvement was also exhibited across delayed retrieval aspects of memory tasks. Some mild decline could be argued across executive functioning; however, this was not consistent and instead isolated to two individual tasks (TMT B, WCST).  Regarding subjective day-to-day concerns, these can be impacted by a variety of factors including medical and cardiovascular ailments, variably treated obstructive sleep apnea, and  mild psychiatric distress. Prior neuroimaging suggesting a remote right frontal insult could help explain some executive dysfunction. As stated in 2022, ongoing tremors raise concern for essential tremor. While the underlying mechanism is unclear, this ailment could also impact day-to-day cognitive efficiencies if present. Specific to memory, Paul Webb was able to learn novel verbal and visual information efficiently and retain this knowledge after lengthy delays. Overall, memory performance combined with intact performances across other areas of cognitive functioning is not suggestive of Alzheimer's disease. Likewise, his cognitive and behavioral profile is not suggestive of any other form of neurodegenerative illness presently.  Recommendations: Should Paul Webb express concerns surrounding cognitive/functional decline in the future, a repeat neuropsychological evaluation would be warranted at that time. The current evaluation will serve as an excellent baseline to compare future evaluations against.   Consistent, daily use of his BiPAP machine will be necessary to obtain maximum benefits from using this device. Untreated or poorly managed sleep apnea will increase his risk for heart attack, stroke, and a future dementia presentation.   A combination of medication and psychotherapy has been shown to be most effective at treating symptoms of anxiety and depression. As such, Paul Webb is encouraged to continue working with his prescribing physician regarding medication adjustments to optimally manage these symptoms. Likewise, Paul Webb could consider engaging in short-term psychotherapy to address symptoms of psychiatric distress. He would benefit from an active and collaborative therapeutic environment, rather than one purely supportive in nature. Recommended treatment modalities include Cognitive Behavioral Therapy (CBT) or Acceptance and Commitment Therapy (ACT).  Paul Webb is encouraged to attend to  lifestyle factors for brain health (e.g., regular physical exercise, good nutrition habits and consideration of the MIND-DASH diet, regular participation in cognitively-stimulating activities, and general stress management techniques), which are likely to have benefits for both emotional adjustment and cognition. In fact, in addition to promoting good general health, regular exercise incorporating aerobic activities (e.g., brisk  walking, jogging, cycling, etc.) has been demonstrated to be a very effective treatment for depression and stress, with similar efficacy rates to both antidepressant medication and psychotherapy. Optimal control of vascular risk factors (including safe cardiovascular exercise and adherence to dietary recommendations) is encouraged.   If interested, there are some activities which have therapeutic value and can be useful in keeping him cognitively stimulated. For suggestions, Paul Webb is encouraged to go to the following website: https://www.[facility].org/get-to-know-barrow/centers-programs/neurorehabilitation-center/neuro-rehab-apps-and-games/ which has options, categorized by level of difficulty. It should be noted that these activities should not be viewed as a substitute for therapy.  Memory can be improved using internal strategies such as rehearsal, repetition, chunking, mnemonics, association, and imagery. External strategies such as written notes in a consistently used memory journal, visual and nonverbal auditory cues such as a calendar on the refrigerator or appointments with alarm, such as on a cell phone, can also help maximize recall.    To address problems with fluctuating attention and/or executive dysfunction, he may wish to consider:   -Avoiding external distractions when needing to concentrate   -Limiting exposure to fast paced environments with multiple sensory demands   -Writing down complicated information and using checklists   -Attempting and completing one  task at a time (i.e., no multi-tasking)   -Verbalizing aloud each step of a task to maintain focus   -Taking frequent breaks during the completion of steps/tasks to avoid fatigue   -Reducing the amount of information considered at one time   -Scheduling more difficult activities for a time of day where he is usually most alert  Review of Records:   Mr. Eschete was seen by [facility] Shon Millet, D.O.) on 10/29/2020 for an evaluation of memory and balance concerns. He reported trouble with recall and has been making paraphasic errors while speaking. For example, if he asks for a glove, he may instead say "hand me the stethoscope." He does sometimes seem to have word-finding difficulty in that he searches for words as well. Balance issues have been ongoing for the past year. He did not report trouble walking on even ground; rather, difficulties occur when walking on uneven ground and he may stumble. He has had about five falls over the past 1.5 years. One of these falls included falling off of a ladder. Medical history is positive for diabetes mellitus with an insulin pump. His blood sugars have not been low. Labs from August 2021 include B12 500; TSH 1.720; CMP with Na 141, K 4.1, Cl 101, CO2 26, Ca 9.1, glucose 196, BUN 27, Cr 1.12, t bili 0.4, ALP 101, AST 13, ALT 14.  He has had uncontrolled diabetes (8) until approximately 3-4 months prior when it was in the 6-7 range. There is also a history of depression and anxiety, for which he takes Abilify, Wellbutrin XL, sertraline, and hydroxyzine. For insomnia, he takes trazodone. There is a further history of obstructive sleep apnea; however, he has been unable to tolerate a CPAP mask. Performance on a brief cognitive screening instrument (SLUMS) was 26/30. Ultimately, Mr. Ponce was referred for a comprehensive neuropsychological evaluation to characterize his cognitive abilities and to assist with diagnostic clarity and treatment planning.   He completed  a comprehensive neuropsychological evaluation with myself on 11/29/2020. At that time, results suggested neuropsychological functioning largely within normal limits. A relative weakness was exhibited across retrieval aspects of memory. However, other aspects such as encoding (i.e., learning) and consolidation were appropriate. Mild performance variability was further noted across response inhibition. Performance  across other assessed cognitive domains was appropriate. This includes processing speed, attention/concentration, other aspects of executive functioning, receptive and expressive language, and visuospatial abilities. The most likely culprit for ongoing cognitive weakness at that time appeared to be a combination of mood, sleep, and medical comorbidities. Continued monitoring was recommended.  He next met with [facility] Marlowe Kays, PA-C) on 04/24/2023 for subjective memory difficulties. At that time, he reported ongoing word finding difficulties and expressed some concern that abilities had progressively worsened over the prior five or so years. He also reported some trouble with distractibility and sustained attention. ADL dysfunction was denied. He had started to use a BiPAP device to treatment sleep apnea in the interim. Performance on a brief cognitive screening instrument (MOCA) was 24/30. Ultimately, Mr. Coriell was referred for a repeat neuropsychological evaluation to characterize his cognitive abilities and to assist with diagnostic clarity and treatment planning.   Neuroimaging  Brain MRI on 11/20/2020 revealed minimal chronic small vessel ischemia, as well as a remote inferior right frontal insult (possibly representing a lacunar infarct). Brain MRI on 05/15/2023 was stable and redemonstrated a small focus of chronic encephalomalacia/gliosis within the anteroinferior frontal lobe. No lobar predominant atrophy was noted.   Past Medical History:  Diagnosis Date   Actinic keratosis  05/22/2021   Arthritis    Benign neoplasm of trunk 05/22/2021   Bursitis of right shoulder    Circadian rhythm sleep disturbance 09/05/2019   Degenerative tear of glenoid labrum of right shoulder    Dermatofibroma 05/22/2021   Deviated septum 01/15/2021   Dupuytren's disease of palm 08/17/2021   Erectile dysfunction 07/30/2012   Essential hypertension 07/30/2012   Fast heart beat    Gastro-esophageal reflux disease with esophagitis 07/30/2012   Generalized anxiety disorder 05/12/2017   GERD (gastroesophageal reflux disease)    Guaiac positive stools 08/03/2018   History of kidney stones    Hyperlipidemia 07/30/2012   Hypersomnia with long sleep time, idiopathic 09/05/2019   Intolerance of continuous positive airway pressure (CPAP) ventilation 05/08/2021   Lentigo 05/22/2021   Major depressive disorder    Nasal turbinate hypertrophy 01/15/2021   Obesity (BMI 30.0-34.9) 09/05/2019   OSA (obstructive sleep apnea)    severe; variable BiPAP use   Other benign neoplasm of skin of left upper limb, including shoulder 05/22/2021   Panic attack 07/30/2012   S/P arthroscopy of right shoulder 04/23/2021   Sebaceous hyperplasia 05/22/2021   Status post nasal septoplasty 05/08/2021   Treatment-emergent central sleep apnea 10/03/2021   Type 2 diabetes mellitus 07/30/2012    Past Surgical History:  Procedure Laterality Date   COLONOSCOPY     COLONOSCOPY N/A 10/06/2018   Procedure: COLONOSCOPY;  Surgeon: Malissa Hippo, MD;  Location: [facility];  Service: Endoscopy;  Laterality: N/A;  1:25   DRUG INDUCED ENDOSCOPY N/A 02/27/2021   Procedure: DRUG INDUCED ENDOSCOPY;  Surgeon: Osborn Coho, MD;  Location: [facility];  Service: ENT;  Laterality: N/A;   FL INJ RT KNEE CT ARTHROGRAM ([facility] HX)     NASAL SEPTOPLASTY W/ TURBINOPLASTY Bilateral 02/27/2021   Procedure: NASAL SEPTOPLASTY WITH TURBINATE REDUCTION;  Surgeon: Osborn Coho, MD;  Location: [facility];  Service: ENT;  Laterality: Bilateral;   POLYPECTOMY  10/06/2018   Procedure: POLYPECTOMY;  Surgeon: Malissa Hippo, MD;  Location: [facility];  Service: Endoscopy;;  colon    Rod in lower leg     trauma   Rt hernia repair  SHOULDER ARTHROSCOPY Right 04/16/2021   Procedure: RIGHT SHOULDER ARTHOROSCOPY AND DEBRIDEMENT;  Surgeon: Vickki Hearing, MD;  Location: [facility];  Service: Orthopedics;  Laterality: Right;    Current Outpatient Medications:    aspirin EC 81 MG tablet, Take 1 tablet (81 mg total) by mouth daily., Disp: , Rfl:    atenolol (TENORMIN) 50 MG tablet, Take 50 mg by mouth at bedtime. , Disp: , Rfl:    BREO ELLIPTA 100-25 MCG/INH AEPB, Inhale 1 puff into the lungs daily., Disp: , Rfl:    buPROPion (WELLBUTRIN XL) 300 MG 24 hr tablet, Take 1 tablet (300 mg total) by mouth daily., Disp: 30 tablet, Rfl: 4   cetirizine (ZYRTEC) 10 MG tablet, Take 10 mg by mouth at bedtime., Disp: , Rfl:    Cholecalciferol (VITAMIN D) 2000 units tablet, Take 2,000 Units by mouth daily., Disp: , Rfl:    cyanocobalamin (VITAMIN B12) 1000 MCG tablet, Take 1,000 mcg by mouth daily., Disp: , Rfl:    diazepam (VALIUM) 2 MG tablet, Take 1 tab 30 minutes prior to MRI, may add an additional tab if needed, Disp: 2 tablet, Rfl: 0   diclofenac (CATAFLAM) 50 MG tablet, Take 1 tablet (50 mg total) by mouth 2 (two) times daily., Disp: 60 tablet, Rfl: 3   hydrOXYzine (ATARAX) 25 MG tablet, Take 25 mg by mouth daily as needed., Disp: , Rfl:    lisinopril-hydrochlorothiazide (PRINZIDE,ZESTORETIC) 20-25 MG tablet, Take 1 tablet by mouth daily., Disp: , Rfl:    MOUNJARO 2.5 MG/0.5ML Pen, SMARTSIG:0.5 Milliliter(s) SUB-Q Once a Week, Disp: , Rfl:    Multiple Vitamin (MULTIVITAMIN) tablet, Take 1 tablet by mouth daily., Disp: , Rfl:    NOVOLOG 100 UNIT/ML injection, Inject into the skin., Disp: , Rfl:    omeprazole (PRILOSEC) 40 MG capsule, Take 40 mg by mouth daily., Disp: , Rfl:    pravastatin  (PRAVACHOL) 80 MG tablet, Take 80 mg by mouth at bedtime. , Disp: , Rfl:    sertraline (ZOLOFT) 100 MG tablet, Take 1.5 tablets (150 mg total) by mouth daily., Disp: 45 tablet, Rfl: 5   traZODone (DESYREL) 100 MG tablet, Take 1 tablet (100 mg total) by mouth at bedtime as needed., Disp: 30 tablet, Rfl: 1  Clinical Interview:   The following information was obtained during a clinical interview with Mr. Thakore prior to cognitive testing.  Cognitive Symptoms: Decreased short-term memory: Largely denied. Concerns generally focused on occasional word finding difficulties.  Decreased long-term memory: Denied. Decreased attention/concentration: Denied. He previously described some concerns surrounding sustained attention (e.g., his his mind will often wander).  Reduced processing speed: Endorsed at times. His wife had previously stated that he seems consistently foggy.  Difficulties with executive functions: Endorsed. He primarily described trouble with organization and multi-tasking. He previously reported indecision, attributed to diminished confidence in decision making abilities. No history of unsafe decisions or poor judgment was reported. Impulsivity and overt personality changes were denied.  Difficulties with emotion regulation: Denied. Difficulties with receptive language: Denied. Difficulties with word finding: Endorsed. His wife previously reported the presence of paraphasic errors where Mr. Ruley will incorrectly substitute one word for another while conversing. For example, he will say "hand me the remote control" when he is actually asking for his cup of coffee. He acknowledged that this behavior had persisted to present day. He also described trouble with tip-of-the-tongue phenomenons. This was said to be stable over time.  Decreased visuoperceptual ability: Denied.   Difficulties completing ADLs: Denied. His wife  manages medications and finances which is longstanding and unchanged in  nature. He denied any current driving concerns.   Additional Medical History: History of traumatic brain injury/concussion: Denied. History of stroke: Prior neuroimaging suggested the potential for a remote right frontal insult. History of seizure activity: Denied. History of known exposure to toxins: Denied. Symptoms of chronic pain: Endorsed. He previously reported diffuse body pain with particular areas of emphasis surrounding his shoulders, elbows, and hands. Symptoms were attributed to osteoarthritis and were described as manageable and stable overall. Over-the-counter medications are used as necessary and are generally effective.  Experience of frequent headaches/migraines: Denied. Frequent instances of dizziness/vertigo: Endorsed. He previously reported commonly feeling dizzy upon quickly standing, as well as like he is "teetering." This was stable.    Sensory changes: He wears glasses with positive effect. His wife reported the potential for mild hearing loss. He has not had a formal hearing evaluation. Other sensory changes/difficulties (e.g., taste or smell) were denied.  Balance/coordination difficulties: Endorsed. He previously reported feeling unsteady on his feet, especially when walking on uneven surfaces or down steeper inclines. Vertigo symptoms were denied and one side of the body was not said to be worse than the other. He acknowledged a handful of falls in the past several years, including one where he fell a significant distance off a ladder. Significant injuries from these falls were not endorsed outside of potential nerve damage in his left arm. Dr. Everlena Cooper previously expressed suspicions that balance concerns were due to diabetic-related peripheral neuropathy.  Other motor difficulties: Endorsed. He previously described tremulous behaviors bilaterally, generally when performing fine motor actions. For example, his wife reported that he has trouble using a screwdriver while working.  He again described a similar screwdriver example today. Symptoms were said to have persisted and remained stable over the prior several years.   Sleep History: Estimated hours obtained each night: 8-9 hours. Difficulties falling asleep: Denied. Difficulties staying asleep: Denied. Feels rested and refreshed upon awakening: Denied.   History of snoring: Endorsed. History of waking up gasping for air: Endorsed. Witnessed breath cessation while asleep: Endorsed. Medical records suggest severe symptomatology. He had trialed various CPAP masks in the past but could not tolerate these devices due to symptoms of claustrophobia. Currently, he reported using a BiPAP machine variably. He noted initially using this device nightly. However, lately, he has noticed that he takes it off during the night and described some increased claustrophobia.    History of vivid dreaming: Denied. Excessive movement while asleep: Endorsed. His wife previously mentioned a seemingly longstanding history of infrequent kicking while asleep. She wondered about this potentially representing restless leg syndrome at that time.  Instances of acting out his dreams: Denied.  Psychiatric/Behavioral Health History: Depression: Endorsed. He previously reported a longstanding history of depression dating back 30 years following the passing of his first wife. He denied a history of psychiatric hospitalizations or suicidal ideation, intent, or plan. He continues works with a Therapist, sports but denied involvement with a psychologist/therapist. He described his current mood as "good." Anxiety: Endorsed. Symptoms of anxiety were said to be generalized in nature and largely coincide with symptoms of depression. Medical records also suggest a history of a potential panic attack.  Mania: Denied. Trauma History: Denied. Visual/auditory hallucinations: Denied. Delusional thoughts: Denied.   Tobacco: Denied. Alcohol: He denied current alcohol  consumption as well as a history of problematic alcohol abuse or dependence.  Recreational drugs: Denied.  Family History: Adopted: Yes  Family history unknown: Yes  This information was confirmed by Mr. Rummler.  Academic/Vocational History: Highest level of educational attainment: 12 years. He graduated high school and described himself as an average (A/B, few C) Consulting civil engineer. He reported completing a few college courses but did not complete a year. Math, specifically algebra, was noted as a relative weakness across academic settings.  History of developmental delay: Denied. History of grade repetition: Denied. Enrollment in special education courses: Denied. History of LD/ADHD: Denied.   Employment: Retired. He previously worked as a Naval architect. 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  Evaluation Results:   Behavioral Observations: Mr. Flemming was unaccompanied, arrived to his appointment on time, and was appropriately dressed and groomed. He appeared alert and oriented. Observed gait and station were within normal limits. Gross motor functioning appeared intact upon informal observation and no abnormal movements (e.g., tremors) were noted during interview. His affect was generally relaxed and positive. Spontaneous speech was fluent and word finding difficulties were not observed during the clinical interview. Thought processes were coherent, organized, and normal in content. Insight into his cognitive difficulties appeared adequate.   During testing, sustained attention was appropriate. Task engagement was adequate and he persisted when challenged. Tremors were prominent during motor tasks, especially in his right hand. Overall, Mr. Balash was cooperative with the clinical interview and subsequent testing procedures.   Adequacy of Effort: The validity of neuropsychological testing is limited by the extent to which the individual being tested may be  assumed to have exerted adequate effort during testing. Mr. Desautel expressed his intention to perform to the best of his abilities and exhibited adequate task engagement and persistence. Scores across stand-alone and embedded performance validity measures were within expectation. As such, the results of the current evaluation are believed to be a valid representation of Mr. Mesch' current cognitive functioning.  Test Results: Mr. Allender was fully oriented at the time of the current evaluation.  Intellectual abilities based upon educational and vocational attainment were estimated to be in the average range. Premorbid abilities were estimated to be within the average range based upon a single-word reading test.   Processing speed was mildly variable but overall appropriate, ranging from the below average to above average normative range. Basic attention was average. More complex attention (e.g., working memory) was above average. Executive functioning was variable, ranging from the well below average to average normative ranges. He performed in the average range across a task assessing safety and judgment.  Assessed receptive language abilities were above average. Likewise, Mr. Mcgonigle did not exhibit any difficulties comprehending task instructions and answered all questions asked of him appropriately. Assessed expressive language (e.g., verbal fluency and confrontation naming) was average to above average.     Assessed visuospatial/visuoconstructional abilities were average to above average outside of some trouble when asked to draw a clock. When drawing a clock, numbers were placed in counter-clockwise fashion. He also incorrectly placed the clock hands.     Learning (i.e., encoding) of novel verbal and visual information was average to above average. Spontaneous delayed recall (i.e., retrieval) of previously learned information was average. Retention rates were 90% across a story learning task, 75%  across a list learning task, and 86% across a shape learning task. Performance across recognition tasks was average to above average, suggesting evidence for information consolidation.  Basic motor speed was average when using his dominant (right) hand and well below average when using his left hand. Fine motor coordination and speed was well below average bilaterally.  Results of emotional screening instruments suggested that recent symptoms of generalized anxiety were in the minimal range, while symptoms of depression were within normal limits. A screening instrument assessing recent sleep quality suggested the presence of minimal sleep dysfunction.  Tables of Scores:   Note: This summary of test scores accompanies the interpretive report and should not be considered in isolation without reference to the appropriate sections in the text. Descriptors are based on appropriate normative data and may be adjusted based on clinical judgment. Terms such as "Within Normal Limits" and "Outside Normal Limits" are used when a more specific description of the test score cannot be determined. Descriptors refer to the current evaluation only.         Percentile - Normative Descriptor > 98 - Exceptionally High 91-97 - Well Above Average 75-90 - Above Average 25-74 - Average 9-24 - Below Average 2-8 - Well Below Average < 2 - Exceptionally Low        Validity: January 2022 Current  DESCRIPTOR        ACS WC: --- --- --- Within Normal Limits  DCT: --- --- --- Within Normal Limits  NAB EVI: --- --- --- Within Normal Limits        Orientation:       Raw Score Raw Score Percentile   NAB Orientation, Form 1 --- 29/29 --- ---        Cognitive Screening:       Raw Score Raw Score Percentile   SLUMS: 26/30 24/30 --- ---        Intellectual Functioning:       Standard Score Standard Score Percentile   Test of Premorbid Functioning: 89 92 30 Average        Memory:      NAB Memory Module, Form 1:  Standard Score/ T Score Standard Score/ T Score Percentile   Total Memory Index --- 102 55 Average  List Learning        Total Trials 1-3 14/36 (38) 18/36 (47) 38 Average    List B 2/12 (38) 3/12 (46) 34 Average    Short Delay Free Recall 4/12 (39) 8/12 (58) 79 Above Average    Long Delay Free Recall 3/12 (36) 6/12 (50) 50 Average    Retention Percentage 75 (41) 75 (42) 21 Below Average    Recognition Discriminability 7 (47) 8 (52) 58 Average  Shape Learning        Total Trials 1-3 17/27 (56) 14/27 (50) 50 Average    Delayed Recall 4/9 (43) 6/9 (56) 73 Average    Retention Percentage 50 (33) 86 (46) 34 Average    Recognition Discriminability 5 (42) 9 (62) 88 Above Average  Story Learning        Immediate Recall 42/80 (38) 51/80 (46) 34 Average    Delayed Recall 18/40 (36) 27/40 (46) 34 Average    Retention Percentage 69 (34) 90 (50) 50 Average  Daily Living Memory        Immediate Recall 38/51 (46) 45/51 (61) 86 Above Average    Delayed Recall 11/17 (42) 13/17 (50) 50 Average    Retention Percentage 73 (39) 76 (43) 25 Average    Recognition Hits 9/10 (51) 9/10 (52) 58 Average        Attention/Executive Function:      Trail Making Test (TMT): Raw Score (T Score) Raw Score (T Score) Percentile     Part A 33 secs.,  0 errors (51) 48 secs.,  0 errors (40) 16  Below Average    Part B 82 secs.,  0 errors (50) 169 secs.,  2 errors (35) 7 Well Below Average          Scaled Score Scaled Score Percentile   WAIS-IV Coding: 7 7 16  Below Average        NAB Attention Module, Form 1: T Score T Score Percentile     Digits Forward 48 54 66 Average    Digits Backwards 45 60 84 Above Average        D-KEFS Color-Word Interference Test: Raw Score (Scaled Score) Raw Score (Scaled Score) Percentile     Color Naming 32 secs. (10) 34 secs. (9) 37 Average    Word Reading 21 secs. (12) 21 secs. (12) 75 Above Average    Inhibition 97 secs. (5) 87 secs. (7) 16 Below Average      Total Errors 5  errors (6) 4 errors (8) 25 Average    Inhibition/Switching 75 secs. (10) 86 secs. (9) 37 Average      Total Errors 2 errors (11) 5 errors (8) 25 Average        D-KEFS Verbal Fluency Test: Raw Score (Scaled Score) Raw Score (Scaled Score) Percentile     Letter Total Correct 32 (9) 35 (10) 50 Average    Category Total Correct 38 (11) 32 (9) 37 Average    Category Switching Total Correct 13 (11) 12 (9) 37 Average    Category Switching Accuracy 11 (10) 11 (10) 50 Average      Total Set Loss Errors 1 (11) 1 (11) 63 Average      Total Repetition Errors 3 (10) 1 (12) 75 Above Average        D-KEFS Design Fluency Test: Raw Score (Scaled Score) Raw Score (Scaled Score) Percentile     Condition 1 - Filled Dots 7 (9) 7 (9) 37 Average    Condition 2 - Empty Dots 6 (7) 8 (9) 37 Average    Condition 3 - Switching 5 (9) 5 (9) 37 Average      Total Set Loss Errors 4 (9) 4 (9) 37 Average      Total Repetition Errors 3 (12) 4 (11) 63 Average        [place] Card Sorting Test: Raw Score Raw Score Percentile     Categories (trials) 3 (64) 1 (64) 6-10 Well Below Average    Total Errors 14 25 25  Average    Perseverative Errors 9 9 62 Average    Non-Perseverative Errors 5 16 9  Below Average    Failure to Maintain Set 1 3 --- ---        NAB Executive Functions Module, Form 1: T Score T Score Percentile     Judgment --- 53 62 Average        Language:      Verbal Fluency Test: Raw Score (T Score) Raw Score (T Score) Percentile     Phonemic Fluency (FAS) 32 (44) 35 (48) 42 Average    Animal Fluency 19 (52) 17 (50) 50 Average         NAB Language Module, Form 1: T Score T Score Percentile     Auditory Comprehension --- 58 79 Above Average    Naming 31/31 (56) 31/31 (57) 75 Above Average        Visuospatial/Visuoconstruction:       Raw Score Raw Score Percentile   Clock Drawing: 9/10 6/10 --- Impaired        NAB Spatial Module,  Form 1: T Score T Score Percentile     Figure Drawing Copy 53 60 84  Above Average         Scaled Score Scaled Score Percentile   WAIS-IV Block Design: 7 8 25  Average        Sensory-Motor:      [place] Grooved Pegboard Test: Raw Score Raw Score Percentile     Dominant Hand 128 secs.,  2 drops 123 secs.,  0 drops  5 Well Below Average    Non-Dominant Hand 117 secs.,  0 drops 138 secs.,  0 drops  5 Well Below Average        Finger Tapping Test: Mean Mean Percentile     Dominant Hand 45 51 42 Average    Non-Dominant Hand 40 37 8 Well Below Average        Mood and Personality:       Raw Score Raw Score Percentile   Beck Depression Inventory - II: 18 6 --- Within Normal Limits  PROMIS Anxiety Questionnaire: 18 10 --- None to Slight        Additional Questionnaires:       Raw Score Raw Score Percentile   PROMIS Sleep Disturbance Questionnaire: 12 10 --- None to Slight   Informed Consent and Coding/Compliance:   The current evaluation represents a clinical evaluation for the purposes previously outlined by the referral source and is in no way reflective of a forensic evaluation.   Mr. Ostergard was provided with a verbal description of the nature and purpose of the present neuropsychological evaluation. Also reviewed were the foreseeable risks and/or discomforts and benefits of the procedure, limits of confidentiality, and mandatory reporting requirements of this provider. The patient was given the opportunity to ask questions and receive answers about the evaluation. Oral consent to participate was provided by the patient.   This evaluation was conducted by Newman Nickels, Ph.D., ABPP-CN, board certified clinical neuropsychologist. Mr. Bila completed a clinical interview with Dr. Milbert Coulter, billed as one unit [phone], and 180 minutes of cognitive testing and scoring, billed as one unit [phone] and five additional units [id number]. Psychometrist Wallace Keller, B.S. assisted Dr. Milbert Coulter with test administration and scoring procedures. As a separate and discrete service,  one unit [id number] and two units [phone] were billed for Dr. Tammy Sours time spent in interpretation and report writing.

## 2023-08-11 ENCOUNTER — Encounter: Payer: Self-pay | Admitting: Psychology

## 2023-08-11 DIAGNOSIS — R251 Tremor, unspecified: Secondary | ICD-10-CM | POA: Insufficient documentation

## 2023-08-12 DIAGNOSIS — E559 Vitamin D deficiency, unspecified: Secondary | ICD-10-CM | POA: Diagnosis not present

## 2023-08-12 DIAGNOSIS — E1065 Type 1 diabetes mellitus with hyperglycemia: Secondary | ICD-10-CM | POA: Diagnosis not present

## 2023-08-12 DIAGNOSIS — Z978 Presence of other specified devices: Secondary | ICD-10-CM | POA: Diagnosis not present

## 2023-08-12 DIAGNOSIS — E785 Hyperlipidemia, unspecified: Secondary | ICD-10-CM | POA: Diagnosis not present

## 2023-08-12 DIAGNOSIS — Z9641 Presence of insulin pump (external) (internal): Secondary | ICD-10-CM | POA: Diagnosis not present

## 2023-08-13 ENCOUNTER — Ambulatory Visit (INDEPENDENT_AMBULATORY_CARE_PROVIDER_SITE_OTHER): Payer: Medicare PPO | Admitting: Psychiatry

## 2023-08-13 DIAGNOSIS — Z91199 Patient's noncompliance with other medical treatment and regimen due to unspecified reason: Secondary | ICD-10-CM

## 2023-08-17 ENCOUNTER — Ambulatory Visit: Payer: Medicare PPO | Admitting: Psychology

## 2023-08-17 DIAGNOSIS — R251 Tremor, unspecified: Secondary | ICD-10-CM | POA: Diagnosis not present

## 2023-08-17 DIAGNOSIS — F411 Generalized anxiety disorder: Secondary | ICD-10-CM

## 2023-08-17 DIAGNOSIS — R4189 Other symptoms and signs involving cognitive functions and awareness: Secondary | ICD-10-CM | POA: Diagnosis not present

## 2023-08-17 NOTE — Progress Notes (Signed)
   Neuropsychology Feedback Session Paul Webb. [facility] Department of Neurology  Reason for Referral:   Paul Webb is a 67 y.o. right-handed Caucasian male referred by Marlowe Kays, PA-C, to characterize his current cognitive functioning and assist with diagnostic clarity and treatment planning in the context of concern for progressive cognitive decline.   Feedback:   Paul Webb completed a comprehensive neuropsychological evaluation on 08/10/2023. Please refer to that encounter for the full report and recommendations. Briefly, results suggested neuropsychological functioning largely within normal limits relative to age-matched peers. Some performance variability was exhibited across executive functioning. He also exhibited an isolated impairment across a clock drawing task (numbers were placed in counter-clockwise fashion with incorrect hand placement); however, all other visuospatial tasks were normatively appropriate. Relative to his previous evaluation in January 2022, stability was exhibited across nearly all assessed cognitive domains. An improvement was exhibited across attention/concentration. A more mild improvement was also exhibited across delayed retrieval aspects of memory tasks. Some mild decline could be argued across executive functioning; however, this was not consistent and instead isolated to two individual tasks (TMT B, WCST). Regarding subjective day-to-day concerns, these can be impacted by a variety of factors including medical and cardiovascular ailments, variably treated obstructive sleep apnea, and mild psychiatric distress. Prior neuroimaging suggesting a remote right frontal insult could help explain some executive dysfunction. As stated in 2022, ongoing tremors raise concern for essential tremor. While the underlying mechanism is unclear, this ailment could also impact day-to-day cognitive efficiencies if present.   Paul Webb was accompanied by his wife  during the current feedback session. Content of the current session focused on the results of his neuropsychological evaluation. Paul Webb was given the opportunity to ask questions and his questions were answered. He was encouraged to reach out should additional questions arise. A copy of his report was provided at the conclusion of the visit.      One unit [phone] was billed for Dr. Tammy Sours time spent preparing for, conducting, and documenting the current feedback session with Paul Webb.

## 2023-08-23 ENCOUNTER — Other Ambulatory Visit: Payer: Self-pay | Admitting: Psychiatry

## 2023-08-23 NOTE — Telephone Encounter (Signed)
The refill has been ordered as requested. Please contact the patient to schedule a follow-up visit.

## 2023-08-24 NOTE — Telephone Encounter (Signed)
Patient scheduled for 08/31/23

## 2023-08-28 NOTE — Progress Notes (Unsigned)
BH MD/PA/NP OP Progress Note  08/31/2023 12:28 PM ALPER GUILMETTE  MRN:  [id number]  Chief Complaint:  Chief Complaint  Patient presents with   Follow-up   HPI:  - According to the chart review, the following events have occurred since the last visit: The patient was seen by endocrinologist for follow up for diabetes in Sept 2024 - he underwent neuropsychological testing. " neuropsychological functioning largely within normal limits "  This is a follow-up appointment for depression, anxiety, insomnia.  He states that he has been doing very well.  He is trying to do 1 thing at a time as he is not good at multitasking.  He thinks it has been helping without feeling frustrated.  He has a body shop at home.  He is doing Curator work for himself, and enjoys Chiropractor truck.  He reports good relationship with his wife, who stays with him most of the time.  He sleeps well.  He denies feeling depressed.  He has not taken any hydroxyzine since the last visit.  He denies SI.  Although he was concerned about memory as he is sometimes unable to remember the name, he denies any other concern..  He would like to stay on the current medication regimen as it is as he has been doing very well.     Wt Readings from Last 3 Encounters:  08/31/23 200 lb 12.8 oz (91.1 kg)  07/20/23 201 lb (91.2 kg)  06/22/23 198 lb 12.8 oz (90.2 kg)     Visit Diagnosis:    ICD-10-CM   1. MDD (major depressive disorder), recurrent, in partial remission (HCC)  F33.41     2. Insomnia, unspecified type  G47.00       Past Psychiatric History: Please see initial evaluation for full details. I have reviewed the history. No updates at this time.     Past Medical History:  Past Medical History:  Diagnosis Date   Actinic keratosis 05/22/2021   Arthritis    Benign neoplasm of trunk 05/22/2021   Bursitis of right shoulder    Circadian rhythm sleep disturbance 09/05/2019   Degenerative tear of glenoid labrum of right  shoulder    Dermatofibroma 05/22/2021   Deviated septum 01/15/2021   Dupuytren's disease of palm 08/17/2021   Erectile dysfunction 07/30/2012   Essential hypertension 07/30/2012   Fast heart beat    Gastro-esophageal reflux disease with esophagitis 07/30/2012   Generalized anxiety disorder 05/12/2017   GERD (gastroesophageal reflux disease)    Guaiac positive stools 08/03/2018   History of kidney stones    Hyperlipidemia 07/30/2012   Hypersomnia with long sleep time, idiopathic 09/05/2019   Intolerance of continuous positive airway pressure (CPAP) ventilation 05/08/2021   Lentigo 05/22/2021   Major depressive disorder    Nasal turbinate hypertrophy 01/15/2021   Obesity (BMI 30.0-34.9) 09/05/2019   OSA (obstructive sleep apnea)    severe; variable BiPAP use   Other benign neoplasm of skin of left upper limb, including shoulder 05/22/2021   Panic attack 07/30/2012   S/P arthroscopy of right shoulder 04/23/2021   Sebaceous hyperplasia 05/22/2021   Status post nasal septoplasty 05/08/2021   Treatment-emergent central sleep apnea 10/03/2021   Tremor    Type 2 diabetes mellitus 07/30/2012    Past Surgical History:  Procedure Laterality Date   COLONOSCOPY     COLONOSCOPY N/A 10/06/2018   Procedure: COLONOSCOPY;  Surgeon: Malissa Hippo, MD;  [facility];  Service: Endoscopy;  Laterality: N/A;  1:25  DRUG INDUCED ENDOSCOPY N/A 02/27/2021   Procedure: DRUG INDUCED ENDOSCOPY;  Surgeon: Osborn Coho, MD;  Location: [facility];  Service: ENT;  Laterality: N/A;   FL INJ RT KNEE CT ARTHROGRAM ([facility] HX)     NASAL SEPTOPLASTY W/ TURBINOPLASTY Bilateral 02/27/2021   Procedure: NASAL SEPTOPLASTY WITH TURBINATE REDUCTION;  Surgeon: Osborn Coho, MD;  Location: [facility];  Service: ENT;  Laterality: Bilateral;   POLYPECTOMY  10/06/2018   Procedure: POLYPECTOMY;  Surgeon: Malissa Hippo, MD;  Location: [facility];  Service: Endoscopy;;   colon    Rod in lower leg     trauma   Rt hernia repair     SHOULDER ARTHROSCOPY Right 04/16/2021   Procedure: RIGHT SHOULDER ARTHOROSCOPY AND DEBRIDEMENT;  Surgeon: Vickki Hearing, MD;  Location: [facility];  Service: Orthopedics;  Laterality: Right;    Family Psychiatric History: Please see initial evaluation for full details. I have reviewed the history. No updates at this time.     Family History:  Family History  Adopted: Yes  Family history unknown: Yes    Social History:  Social History   Socioeconomic History   Marital status: Married    Spouse name: Clydie Braun   Number of children: Not on file   Years of education: 12   Highest education level: High school graduate  Occupational History   Occupation: Retired  Tobacco Use   Smoking status: Never   Smokeless tobacco: Never  Vaping Use   Vaping status: Never Used  Substance and Sexual Activity   Alcohol use: Never   Drug use: Never   Sexual activity: Not on file  Other Topics Concern   Not on file  Social History Narrative   Right Handed   Lives in one story with a basement    Drinks Caffeine    Lives with wife   retired   Chief Executive Officer Determinants of Corporate investment banker Strain: Not on file  Food Insecurity: Not on file  Transportation Needs: Not on file  Physical Activity: Not on file  Stress: Not on file  Social Connections: Not on file    Allergies: No Known Allergies  Metabolic Disorder Labs: Lab Results  Component Value Date   HGBA1C 6.4 (H) 04/15/2021   MPG 136.98 04/15/2021   No results found for: "PROLACTIN" No results found for: "CHOL", "TRIG", "HDL", "CHOLHDL", "VLDL", "LDLCALC" No results found for: "TSH"  Therapeutic Level Labs: No results found for: "LITHIUM" No results found for: "VALPROATE" No results found for: "CBMZ"  Current Medications: Current Outpatient Medications  Medication Sig Dispense Refill   aspirin EC 81 MG tablet Take 1 tablet (81 mg total) by mouth daily.      atenolol (TENORMIN) 50 MG tablet Take 50 mg by mouth at bedtime.      BREO ELLIPTA 100-25 MCG/INH AEPB Inhale 1 puff into the lungs daily.     buPROPion (WELLBUTRIN XL) 300 MG 24 hr tablet Take 1 tablet (300 mg total) by mouth daily. 30 tablet 4   cetirizine (ZYRTEC) 10 MG tablet Take 10 mg by mouth at bedtime.     Cholecalciferol (VITAMIN D) 2000 units tablet Take 2,000 Units by mouth daily.     cyanocobalamin (VITAMIN B12) 1000 MCG tablet Take 1,000 mcg by mouth daily.     diazepam (VALIUM) 2 MG tablet Take 1 tab 30 minutes prior to MRI, may add an additional tab if needed 2 tablet 0   diclofenac (CATAFLAM) 50 MG  tablet Take 1 tablet (50 mg total) by mouth 2 (two) times daily. 60 tablet 3   hydrOXYzine (ATARAX) 25 MG tablet Take 1 tablet (25 mg total) by mouth daily as needed for anxiety. 30 tablet 0   lisinopril-hydrochlorothiazide (PRINZIDE,ZESTORETIC) 20-25 MG tablet Take 1 tablet by mouth daily.     MOUNJARO 2.5 MG/0.5ML Pen SMARTSIG:0.5 Milliliter(s) SUB-Q Once a Week     Multiple Vitamin (MULTIVITAMIN) tablet Take 1 tablet by mouth daily.     NOVOLOG 100 UNIT/ML injection Inject into the skin.     omeprazole (PRILOSEC) 40 MG capsule Take 40 mg by mouth daily.     pravastatin (PRAVACHOL) 80 MG tablet Take 80 mg by mouth at bedtime.      sertraline (ZOLOFT) 100 MG tablet Take 1.5 tablets (150 mg total) by mouth daily. 45 tablet 5   traZODone (DESYREL) 100 MG tablet Take 1 tablet (100 mg total) by mouth at bedtime as needed. 30 tablet 5   No current facility-administered medications for this visit.     Musculoskeletal: Strength & Muscle Tone: within normal limits Gait & Station: normal Patient leans: N/A  Psychiatric Specialty Exam: Review of Systems  Psychiatric/Behavioral: Negative.    All other systems reviewed and are negative.   Blood pressure 122/73, pulse 64, temperature (!) 97.2 F (36.2 C), temperature source Skin, height 5\' 11"  (1.803 m), weight 200 lb 12.8 oz (91.1  kg).Body mass index is 28.01 kg/m.  General Appearance: Well Groomed  Eye Contact:  Good  Speech:  Clear and Coherent  Volume:  Normal  Mood:   good  Affect:  Appropriate, Congruent, and Full Range  Thought Process:  Coherent  Orientation:  Full (Time, Place, and Person)  Thought Content: Logical   Suicidal Thoughts:  No  Homicidal Thoughts:  No  Memory:  Immediate;   Good  Judgement:  Good  Insight:  Good  Psychomotor Activity:  Normal  Concentration:  Concentration: Good and Attention Span: Good  Recall:  Good  Fund of Knowledge: Good  Language: Good  Akathisia:  No  Handed:  Right  AIMS (if indicated): not done  Assets:  Communication Skills Desire for Improvement  ADL's:  Intact  Cognition: WNL  Sleep:  Good   Screenings: GAD-7    Flowsheet Row Office Visit from 12/29/2022 in [facility] Office Visit from 10/06/2022 in [facility] Office Visit from 07/10/2022 in [facility]  Total GAD-7 Score 0 3 12      PHQ2-9    Flowsheet Row Office Visit from 12/29/2022 in [facility] Office Visit from 10/06/2022 in [facility] Office Visit from 07/10/2022 in [facility] Video Visit from 12/17/2021 in [facility] Video Visit from 08[facility]07/2021 in [facility]  PHQ-2 Total Score 0 0 0 0 0      Flowsheet Row Office Visit from 12/29/2022 in [facility] Office Visit from 10/06/2022 in [facility] Admission (Discharged) from 04/16/2021 in [facility]  C-SSRS RISK CATEGORY No Risk No Risk No Risk        Assessment and Plan:  JAYLEEN AFONSO is a 67 y.o. year old male with a  history of depression, anxiety,  diabetes, hyperlipidemia, severe sleep apnea (on BiPAP), who presents for follow up appointment for below.  1. MDD (major depressive disorder), recurrent, in partial remission (HCC) Acute stressors include: memory loss Other stressors include: retired,previously unemployed due to being in insulin pump   History: previously on venlafaxine for anxiety for 20 years, worsened after unemployment  (on current medication regimen since April 2021) There has been steady improvement in increasing symptoms, and he denies irritability since utilizing coping skills.  Will continue current dose of sertraline and bupropion as maintenance treatment for depression.  Will continue hydroxyzine as needed for anxiety.   2. Insomnia, unspecified type - he uses CPAP machine Improving.  Will continue current dose of trazodone as needed for insomnia.   Plan Continue sertraline 150 mg daily  Continue bupropion 300 mg daily  Continue Trazodone 100 mg at night as needed for insomnia Continue hydroxyzine 25 mg daily as needed for anxiety Next appointment: 1/6 at 11:30   n.  He is not interested in transferring to [place] office anymore.   Past trials of medication: ?lexapro, venlafaxine, Abilify   The patient demonstrates the following risk factors for suicide: Chronic risk factors for suicide include: psychiatric disorder of depression. Acute risk factors for suicide include: family or marital conflict and loss (financial, interpersonal, professional). Protective factors for this patient include: coping skills and hope for the future. Considering these factors, the overall suicide risk at this point appears to be low. Patient is appropriate for outpatient follow up.  Collaboration of Care: Collaboration of Care: Other reviewed notes in Epic  Patient/Guardian was advised Release of Information must be obtained prior to any record release in order to collaborate their care with an  outside provider. Patient/Guardian was advised if they have not already done so to contact the registration department to sign all necessary forms in order for [place] to release information regarding their care.   Consent: Patient/Guardian gives verbal consent for treatment and assignment of benefits for services provided during this visit. Patient/Guardian expressed understanding and agreed to proceed.    Neysa Hotter, MD 08/31/2023, 12:28 PM

## 2023-08-31 ENCOUNTER — Ambulatory Visit (INDEPENDENT_AMBULATORY_CARE_PROVIDER_SITE_OTHER): Payer: Medicare PPO | Admitting: Psychiatry

## 2023-08-31 ENCOUNTER — Encounter: Payer: Self-pay | Admitting: Psychiatry

## 2023-08-31 VITALS — BP 122/73 | HR 64 | Temp 97.2°F | Ht 71.0 in | Wt 200.8 lb

## 2023-08-31 DIAGNOSIS — G4700 Insomnia, unspecified: Secondary | ICD-10-CM | POA: Diagnosis not present

## 2023-08-31 DIAGNOSIS — F3341 Major depressive disorder, recurrent, in partial remission: Secondary | ICD-10-CM

## 2023-08-31 DIAGNOSIS — E1065 Type 1 diabetes mellitus with hyperglycemia: Secondary | ICD-10-CM | POA: Diagnosis not present

## 2023-08-31 MED ORDER — HYDROXYZINE HCL 25 MG PO TABS: 25.0000 mg | ORAL_TABLET | Freq: Every day | ORAL | 0 refills | Status: AC | PRN

## 2023-08-31 MED ORDER — TRAZODONE HCL 100 MG PO TABS: 100.0000 mg | ORAL_TABLET | Freq: Every evening | ORAL | 5 refills | Status: DC | PRN

## 2023-08-31 NOTE — Patient Instructions (Addendum)
Continue sertraline 150 mg daily  Continue bupropion 300 mg daily  Continue Trazodone 100 mg at night as needed for insomnia Continue hydroxyzine 25 mg daily as needed for anxiety Next appointment: 1/6 at 11:30

## 2023-09-03 DIAGNOSIS — I10 Essential (primary) hypertension: Secondary | ICD-10-CM | POA: Diagnosis not present

## 2023-09-03 DIAGNOSIS — Z299 Encounter for prophylactic measures, unspecified: Secondary | ICD-10-CM | POA: Diagnosis not present

## 2023-09-03 DIAGNOSIS — J449 Chronic obstructive pulmonary disease, unspecified: Secondary | ICD-10-CM | POA: Diagnosis not present

## 2023-09-03 DIAGNOSIS — Z0000 Encounter for general adult medical examination without abnormal findings: Secondary | ICD-10-CM | POA: Diagnosis not present

## 2023-09-03 DIAGNOSIS — Z23 Encounter for immunization: Secondary | ICD-10-CM | POA: Diagnosis not present

## 2023-09-03 DIAGNOSIS — I739 Peripheral vascular disease, unspecified: Secondary | ICD-10-CM | POA: Diagnosis not present

## 2023-09-03 DIAGNOSIS — F321 Major depressive disorder, single episode, moderate: Secondary | ICD-10-CM | POA: Diagnosis not present

## 2023-09-29 ENCOUNTER — Other Ambulatory Visit: Payer: Self-pay | Admitting: Orthopedic Surgery

## 2023-09-29 DIAGNOSIS — M1712 Unilateral primary osteoarthritis, left knee: Secondary | ICD-10-CM

## 2023-09-29 DIAGNOSIS — G8929 Other chronic pain: Secondary | ICD-10-CM

## 2023-10-27 DIAGNOSIS — G4733 Obstructive sleep apnea (adult) (pediatric): Secondary | ICD-10-CM | POA: Diagnosis not present

## 2023-11-09 DIAGNOSIS — E1065 Type 1 diabetes mellitus with hyperglycemia: Secondary | ICD-10-CM | POA: Diagnosis not present

## 2023-11-20 DIAGNOSIS — M25559 Pain in unspecified hip: Secondary | ICD-10-CM | POA: Diagnosis not present

## 2023-11-20 DIAGNOSIS — M5432 Sciatica, left side: Secondary | ICD-10-CM | POA: Diagnosis not present

## 2023-11-20 DIAGNOSIS — Z299 Encounter for prophylactic measures, unspecified: Secondary | ICD-10-CM | POA: Diagnosis not present

## 2023-11-20 DIAGNOSIS — I10 Essential (primary) hypertension: Secondary | ICD-10-CM | POA: Diagnosis not present

## 2023-11-20 DIAGNOSIS — E1169 Type 2 diabetes mellitus with other specified complication: Secondary | ICD-10-CM | POA: Diagnosis not present

## 2023-11-20 DIAGNOSIS — R52 Pain, unspecified: Secondary | ICD-10-CM | POA: Diagnosis not present

## 2023-11-22 ENCOUNTER — Other Ambulatory Visit: Payer: Self-pay | Admitting: Psychiatry

## 2023-11-22 NOTE — Progress Notes (Deleted)
 BH MD/PA/NP OP Progress Note  11/22/2023 8:33 AM Paul Webb  MRN:  [id number]  Chief Complaint: No chief complaint on file.  HPI: *** Visit Diagnosis: No diagnosis found.  Past Psychiatric History: Please see initial evaluation for full details. I have reviewed the history. No updates at this time.     Past Medical History:  Past Medical History:  Diagnosis Date   Actinic keratosis 05/22/2021   Arthritis    Benign neoplasm of trunk 05/22/2021   Bursitis of right shoulder    Circadian rhythm sleep disturbance 09/05/2019   Degenerative tear of glenoid labrum of right shoulder    Dermatofibroma 05/22/2021   Deviated septum 01/15/2021   Dupuytren's disease of palm 08/17/2021   Erectile dysfunction 07/30/2012   Essential hypertension 07/30/2012   Fast heart beat    Gastro-esophageal reflux disease with esophagitis 07/30/2012   Generalized anxiety disorder 05/12/2017   GERD (gastroesophageal reflux disease)    Guaiac positive stools 08/03/2018   History of kidney stones    Hyperlipidemia 07/30/2012   Hypersomnia with long sleep time, idiopathic 09/05/2019   Intolerance of continuous positive airway pressure (CPAP) ventilation 05/08/2021   Lentigo 05/22/2021   Major depressive disorder    Nasal turbinate hypertrophy 01/15/2021   Obesity (BMI 30.0-34.9) 09/05/2019   OSA (obstructive sleep apnea)    severe; variable BiPAP use   Other benign neoplasm of skin of left upper limb, including shoulder 05/22/2021   Panic attack 07/30/2012   S/P arthroscopy of right shoulder 04/23/2021   Sebaceous hyperplasia 05/22/2021   Status post nasal septoplasty 05/08/2021   Treatment-emergent central sleep apnea 10/03/2021   Tremor    Type 2 diabetes mellitus 07/30/2012    Past Surgical History:  Procedure Laterality Date   COLONOSCOPY     COLONOSCOPY N/A 10/06/2018   Procedure: COLONOSCOPY;  Surgeon: Malissa Hippo, MD;  [facility];  Service: Endoscopy;  Laterality:  N/A;  1:25   DRUG INDUCED ENDOSCOPY N/A 02/27/2021   Procedure: DRUG INDUCED ENDOSCOPY;  Surgeon: Osborn Coho, MD;  Location: [facility];  Service: ENT;  Laterality: N/A;   FL INJ RT KNEE CT ARTHROGRAM ([facility] HX)     NASAL SEPTOPLASTY W/ TURBINOPLASTY Bilateral 02/27/2021   Procedure: NASAL SEPTOPLASTY WITH TURBINATE REDUCTION;  Surgeon: Osborn Coho, MD;  Location: [facility];  Service: ENT;  Laterality: Bilateral;   POLYPECTOMY  10/06/2018   Procedure: POLYPECTOMY;  Surgeon: Malissa Hippo, MD;  [facility];  Service: Endoscopy;;  colon    Rod in lower leg     trauma   Rt hernia repair     SHOULDER ARTHROSCOPY Right 04/16/2021   Procedure: RIGHT SHOULDER ARTHOROSCOPY AND DEBRIDEMENT;  Surgeon: Vickki Hearing, MD;  [facility]: [facility];  Service: Orthopedics;  Laterality: Right;    Family Psychiatric History: Please see initial evaluation for full details. I have reviewed the history. No updates at this time.     Family History:  Family History  Adopted: Yes  Family history unknown: Yes    Social History:  Social History   Socioeconomic History   Marital status: Married    Spouse name: Clydie Braun   Number of children: Not on file   Years of education: 12   Highest education level: High school graduate  Occupational History   Occupation: Retired  Tobacco Use   Smoking status: Never   Smokeless tobacco: Never  Vaping Use   Vaping status: Never Used  Substance and  Sexual Activity   Alcohol use: Never   Drug use: Never   Sexual activity: Not on file  Other Topics Concern   Not on file  Social History Narrative   Right Handed   Lives in one story with a basement    Drinks Caffeine    Lives with wife   retired   [employer] investment banker Strain: Not on file  Food Insecurity: Not on file  Transportation Needs: Not on file  Physical Activity: Not on file  Stress: Not on file  Social Connections: Not  on file    Allergies: No Known Allergies  Metabolic Disorder Labs: Lab Results  Component Value Date   HGBA1C 6.4 (H) 04/15/2021   MPG 136.98 04/15/2021   No results found for: "PROLACTIN" No results found for: "CHOL", "TRIG", "HDL", "CHOLHDL", "VLDL", "LDLCALC" No results found for: "TSH"  Therapeutic Level Labs: No results found for: "LITHIUM" No results found for: "VALPROATE" No results found for: "CBMZ"  Current Medications: Current Outpatient Medications  Medication Sig Dispense Refill   aspirin EC 81 MG tablet Take 1 tablet (81 mg total) by mouth daily.     atenolol (TENORMIN) 50 MG tablet Take 50 mg by mouth at bedtime.      BREO ELLIPTA 100-25 MCG/INH AEPB Inhale 1 puff into the lungs daily.     [START ON 11/30/2023] buPROPion (WELLBUTRIN XL) 300 MG 24 hr tablet Take 1 tablet (300 mg total) by mouth daily. 30 tablet 5   cetirizine (ZYRTEC) 10 MG tablet Take 10 mg by mouth at bedtime.     Cholecalciferol (VITAMIN D) 2000 units tablet Take 2,000 Units by mouth daily.     cyanocobalamin (VITAMIN B12) 1000 MCG tablet Take 1,000 mcg by mouth daily.     diazepam (VALIUM) 2 MG tablet Take 1 tab 30 minutes prior to MRI, may add an additional tab if needed 2 tablet 0   diclofenac (CATAFLAM) 50 MG tablet TAKE 1 TABLET BY MOUTH TWICE DAILY 60 tablet 3   lisinopril-hydrochlorothiazide (PRINZIDE,ZESTORETIC) 20-25 MG tablet Take 1 tablet by mouth daily.     MOUNJARO 2.5 MG/0.5ML Pen SMARTSIG:0.5 Milliliter(s) SUB-Q Once a Week     Multiple Vitamin (MULTIVITAMIN) tablet Take 1 tablet by mouth daily.     NOVOLOG 100 UNIT/ML injection Inject into the skin.     omeprazole (PRILOSEC) 40 MG capsule Take 40 mg by mouth daily.     pravastatin (PRAVACHOL) 80 MG tablet Take 80 mg by mouth at bedtime.      sertraline (ZOLOFT) 100 MG tablet Take 1.5 tablets (150 mg total) by mouth daily. 45 tablet 5   traZODone (DESYREL) 100 MG tablet Take 1 tablet (100 mg total) by mouth at bedtime as needed. 30  tablet 5   No current facility-administered medications for this visit.     Musculoskeletal: Strength & Muscle Tone: within normal limits Gait & Station: normal Patient leans: N/A  Psychiatric Specialty Exam: Review of Systems  There were no vitals taken for this visit.There is no height or weight on file to calculate BMI.  General Appearance: {Appearance:22683}  Eye Contact:  {BHH EYE CONTACT:22684}  Speech:  Clear and Coherent  Volume:  Normal  Mood:  {BHH MOOD:22306}  Affect:  {Affect (PAA):22687}  Thought Process:  Coherent  Orientation:  Full (Time, Place, and Person)  Thought Content: Logical   Suicidal Thoughts:  {ST/HT (PAA):22692}  Homicidal Thoughts:  {ST/HT (PAA):22692}  Memory:  Immediate;   Good  Judgement:  {Judgement (PAA):22694}  Insight:  {Insight (PAA):22695}  Psychomotor Activity:  Normal  Concentration:  Concentration: Good and Attention Span: Good  Recall:  Good  Fund of Knowledge: Good  Language: Good  Akathisia:  No  Handed:  Right  AIMS (if indicated): not done  Assets:  Communication Skills Desire for Improvement  ADL's:  Intact  Cognition: WNL  Sleep:  {BHH GOOD/FAIR/POOR:22877}   Screenings: GAD-7    Flowsheet Row Office Visit from 12/29/2022 in [facility] Office Visit from 10/06/2022 in [facility] Office Visit from 07/10/2022 in [facility]  Total GAD-7 Score 0 3 12      PHQ2-9    Flowsheet Row Office Visit from 12/29/2022 in [facility] Office Visit from 10/06/2022 in [facility] Office Visit from 07/10/2022 in [facility] Video Visit from 12/17/2021 in [facility] Video Visit from 08/07/2021 in [facility]  PHQ-2 Total  Score 0 0 0 0 0      Flowsheet Row Office Visit from 12/29/2022 in [facility] Office Visit from 10/06/2022 in [facility] Admission (Discharged) from 04/16/2021 in [place] [facility]  C-SSRS RISK CATEGORY No Risk No Risk No Risk        Assessment and Plan:  Paul Webb is a 67 y.o. year old male with a history of depression, anxiety,  diabetes, hyperlipidemia, severe sleep apnea (on BiPAP), who presents for follow up appointment for below.    1. MDD (major depressive disorder), recurrent, in partial remission (HCC) Acute stressors include: memory loss Other stressors include: retired,previously unemployed due to being in insulin pump   History: previously on venlafaxine for anxiety for 20 years, worsened after unemployment  (on current medication regimen since April 2021) There has been steady improvement in increasing symptoms, and he denies irritability since utilizing coping skills.  Will continue current dose of sertraline and bupropion as maintenance treatment for depression.  Will continue hydroxyzine as needed for anxiety.    2. Insomnia, unspecified type - he uses CPAP machine Improving.  Will continue current dose of trazodone as needed for insomnia.    Plan Continue sertraline 150 mg daily  Continue bupropion 300 mg daily  Continue Trazodone 100 mg at night as needed for insomnia Continue hydroxyzine 25 mg daily as needed for anxiety Next appointment: 1/6 at 11:30   n.  He is not interested in transferring to [place] office anymore.   Past trials of medication: ?lexapro, venlafaxine, Abilify   The patient demonstrates the following risk factors for suicide: Chronic risk factors for suicide include: psychiatric disorder of depression. Acute risk factors for suicide include: family or marital conflict and loss (financial, interpersonal, professional). Protective factors for this  patient include: coping skills and hope for the future. Considering these factors, the overall suicide risk at this point appears to be low. Patient is appropriate for outpatient follow up.  Collaboration of Care: Collaboration of Care: {BH OP Collaboration of Care:21014065}  Patient/Guardian was advised Release of Information must be obtained prior to any record release in order to collaborate their care with an outside provider. Patient/Guardian was advised if they have not already done so to contact the registration department to sign all necessary forms in order for [place] to release information regarding their care.  Consent: Patient/Guardian gives verbal consent for treatment and assignment of benefits for services provided during this visit. Patient/Guardian expressed understanding and agreed to proceed.    Neysa Hotter, MD 11/22/2023, 8:33 AM

## 2023-11-30 ENCOUNTER — Ambulatory Visit: Payer: Medicare PPO | Admitting: Psychiatry

## 2023-11-30 DIAGNOSIS — E1065 Type 1 diabetes mellitus with hyperglycemia: Secondary | ICD-10-CM | POA: Diagnosis not present

## 2023-12-02 DIAGNOSIS — E559 Vitamin D deficiency, unspecified: Secondary | ICD-10-CM | POA: Diagnosis not present

## 2023-12-02 DIAGNOSIS — Z9641 Presence of insulin pump (external) (internal): Secondary | ICD-10-CM | POA: Diagnosis not present

## 2023-12-02 DIAGNOSIS — Z978 Presence of other specified devices: Secondary | ICD-10-CM | POA: Diagnosis not present

## 2023-12-02 DIAGNOSIS — E785 Hyperlipidemia, unspecified: Secondary | ICD-10-CM | POA: Diagnosis not present

## 2023-12-02 DIAGNOSIS — E1065 Type 1 diabetes mellitus with hyperglycemia: Secondary | ICD-10-CM | POA: Diagnosis not present

## 2023-12-03 ENCOUNTER — Ambulatory Visit: Payer: Medicare PPO | Admitting: Orthopedic Surgery

## 2023-12-04 ENCOUNTER — Ambulatory Visit: Payer: Medicare PPO | Admitting: Physician Assistant

## 2023-12-22 ENCOUNTER — Other Ambulatory Visit: Payer: Self-pay | Admitting: Psychiatry

## 2024-01-06 NOTE — Progress Notes (Deleted)
 BH MD/PA/NP OP Progress Note  01/06/2024 2:34 PM Paul Webb  MRN:  [id number]  Chief Complaint: No chief complaint on file.  HPI: *** Visit Diagnosis: No diagnosis found.  Past Psychiatric History: Please see initial evaluation for full details. I have reviewed the history. No updates at this time.     Past Medical History:  Past Medical History:  Diagnosis Date   Actinic keratosis 05/22/2021   Arthritis    Benign neoplasm of trunk 05/22/2021   Bursitis of right shoulder    Circadian rhythm sleep disturbance 09/05/2019   Degenerative tear of glenoid labrum of right shoulder    Dermatofibroma 05/22/2021   Deviated septum 01/15/2021   Dupuytren's disease of palm 08/17/2021   Erectile dysfunction 07/30/2012   Essential hypertension 07/30/2012   Fast heart beat    Gastro-esophageal reflux disease with esophagitis 07/30/2012   Generalized anxiety disorder 05/12/2017   GERD (gastroesophageal reflux disease)    Guaiac positive stools 08/03/2018   History of kidney stones    Hyperlipidemia 07/30/2012   Hypersomnia with long sleep time, idiopathic 09/05/2019   Intolerance of continuous positive airway pressure (CPAP) ventilation 05/08/2021   Lentigo 05/22/2021   Major depressive disorder    Nasal turbinate hypertrophy 01/15/2021   Obesity (BMI 30.0-34.9) 09/05/2019   OSA (obstructive sleep apnea)    severe; variable BiPAP use   Other benign neoplasm of skin of left upper limb, including shoulder 05/22/2021   Panic attack 07/30/2012   S/P arthroscopy of right shoulder 04/23/2021   Sebaceous hyperplasia 05/22/2021   Status post nasal septoplasty 05/08/2021   Treatment-emergent central sleep apnea 10/03/2021   Tremor    Type 2 diabetes mellitus 07/30/2012    Past Surgical History:  Procedure Laterality Date   COLONOSCOPY     COLONOSCOPY N/A 10/06/2018   Procedure: COLONOSCOPY;  Surgeon: Malissa Hippo, MD;  [facility];  Service: Endoscopy;  Laterality: N/A;   1:25   DRUG INDUCED ENDOSCOPY N/A 02/27/2021   Procedure: DRUG INDUCED ENDOSCOPY;  Surgeon: Osborn Coho, MD;  Location: [facility];  Service: ENT;  Laterality: N/A;   FL INJ RT KNEE CT ARTHROGRAM ([facility] HX)     NASAL SEPTOPLASTY W/ TURBINOPLASTY Bilateral 02/27/2021   Procedure: NASAL SEPTOPLASTY WITH TURBINATE REDUCTION;  Surgeon: Osborn Coho, MD;  Location: [facility];  Service: ENT;  Laterality: Bilateral;   POLYPECTOMY  10/06/2018   Procedure: POLYPECTOMY;  Surgeon: Malissa Hippo, MD;  [facility];  Service: Endoscopy;;  colon    Rod in lower leg     trauma   Rt hernia repair     SHOULDER ARTHROSCOPY Right 04/16/2021   Procedure: RIGHT SHOULDER ARTHOROSCOPY AND DEBRIDEMENT;  Surgeon: Vickki Hearing, MD;  [facility];  Service: Orthopedics;  Laterality: Right;    Family Psychiatric History: Please see initial evaluation for full details. I have reviewed the history. No updates at this time.     Family History:  Family History  Adopted: Yes  Family history unknown: Yes    Social History:  Social History   Socioeconomic History   Marital status: Married    Spouse name: Clydie Braun   Number of children: Not on file   Years of education: 12   Highest education level: High school graduate  Occupational History   Occupation: Retired  Tobacco Use   Smoking status: Never   Smokeless tobacco: Never  Vaping Use   Vaping status: Never Used  Substance and  Sexual Activity   Alcohol use: Never   Drug use: Never   Sexual activity: Not on file  Other Topics Concern   Not on file  Social History Narrative   Right Handed   Lives in one story with a basement    Drinks Caffeine    Lives with wife   retired   [employer] Executive [employer] investment banker Strain: Not on file  Food Insecurity: Not on file  Transportation Needs: Not on file  Physical Activity: Not on file  Stress: Not on file  Social Connections: Not on  file    Allergies: No Known Allergies  Metabolic Disorder Labs: Lab Results  Component Value Date   HGBA1C 6.4 (H) 04/15/2021   MPG 136.98 04/15/2021   No results found for: "PROLACTIN" No results found for: "CHOL", "TRIG", "HDL", "CHOLHDL", "VLDL", "LDLCALC" No results found for: "TSH"  Therapeutic Level Labs: No results found for: "LITHIUM" No results found for: "VALPROATE" No results found for: "CBMZ"  Current Medications: Current Outpatient Medications  Medication Sig Dispense Refill   aspirin EC 81 MG tablet Take 1 tablet (81 mg total) by mouth daily.     atenolol (TENORMIN) 50 MG tablet Take 50 mg by mouth at bedtime.      BREO ELLIPTA 100-25 MCG/INH AEPB Inhale 1 puff into the lungs daily.     buPROPion (WELLBUTRIN XL) 300 MG 24 hr tablet Take 1 tablet (300 mg total) by mouth daily. 30 tablet 5   cetirizine (ZYRTEC) 10 MG tablet Take 10 mg by mouth at bedtime.     Cholecalciferol (VITAMIN D) 2000 units tablet Take 2,000 Units by mouth daily.     cyanocobalamin (VITAMIN B12) 1000 MCG tablet Take 1,000 mcg by mouth daily.     diazepam (VALIUM) 2 MG tablet Take 1 tab 30 minutes prior to MRI, may add an additional tab if needed 2 tablet 0   diclofenac (CATAFLAM) 50 MG tablet TAKE 1 TABLET BY MOUTH TWICE DAILY 60 tablet 3   lisinopril-hydrochlorothiazide (PRINZIDE,ZESTORETIC) 20-25 MG tablet Take 1 tablet by mouth daily.     MOUNJARO 2.5 MG/0.5ML Pen SMARTSIG:0.5 Milliliter(s) SUB-Q Once a Week     Multiple Vitamin (MULTIVITAMIN) tablet Take 1 tablet by mouth daily.     NOVOLOG 100 UNIT/ML injection Inject into the skin.     omeprazole (PRILOSEC) 40 MG capsule Take 40 mg by mouth daily.     pravastatin (PRAVACHOL) 80 MG tablet Take 80 mg by mouth at bedtime.      sertraline (ZOLOFT) 100 MG tablet TAKE 1 AND 1/2 TABLETS BY MOUTH DAILY 45 tablet 0   traZODone (DESYREL) 100 MG tablet Take 1 tablet (100 mg total) by mouth at bedtime as needed. 30 tablet 5   No current  facility-administered medications for this visit.     Musculoskeletal: Strength & Muscle Tone: within normal limits Gait & Station: normal Patient leans: N/A  Psychiatric Specialty Exam: Review of Systems  There were no vitals taken for this visit.There is no height or weight on file to calculate BMI.  General Appearance: {Appearance:22683}  Eye Contact:  {BHH EYE CONTACT:22684}  Speech:  Clear and Coherent  Volume:  Normal  Mood:  {BHH MOOD:22306}  Affect:  {Affect (PAA):22687}  Thought Process:  Coherent  Orientation:  Full (Time, Place, and Person)  Thought Content: Logical   Suicidal Thoughts:  {ST/HT (PAA):22692}  Homicidal Thoughts:  {ST/HT (PAA):22692}  Memory:  Immediate;   Good  Judgement:  {Judgement (  XBM):84132}  Insight:  {Insight (PAA):22695}  Psychomotor Activity:  Normal  Concentration:  Concentration: Good and Attention Span: Good  Recall:  Good  Fund of Knowledge: Good  Language: Good  Akathisia:  No  Handed:  Right  AIMS (if indicated): not done  Assets:  Communication Skills Desire for Improvement  ADL's:  Intact  Cognition: WNL  Sleep:  {BHH GOOD/FAIR/POOR:22877}   Screenings: GAD-7    Flowsheet Row Office Visit from 12/29/2022 in [facility] Office Visit from 10/06/2022 in [facility] Office Visit from 07/10/2022 in [facility]  Total GAD-7 Score 0 3 12      PHQ2-9    Flowsheet Row Office Visit from 12/29/2022 in [facility] Office Visit from 10/06/2022 in [facility] Office Visit from 07/10/2022 in [facility] Video Visit from 12/17/2021 in [facility] Video Visit from 08/07/2021 in [facility]  PHQ-2 Total Score 0 0 0 0 0       Flowsheet Row Office Visit from 12/29/2022 in [facility] Office Visit from 10/06/2022 in [facility] Admission (Discharged) from 04/16/2021 in [place] [facility]  C-SSRS RISK CATEGORY No Risk No Risk No Risk        Assessment and Plan:  CARLEY STRICKLING is a 68 y.o. year old male with a history of depression, anxiety,  diabetes, hyperlipidemia, severe sleep apnea (on BiPAP), who presents for follow up appointment for below.    1. MDD (major depressive disorder), recurrent, in partial remission (HCC) Acute stressors include: memory loss Other stressors include: retired,previously unemployed due to being in insulin pump   History: previously on venlafaxine for anxiety for 20 years, worsened after unemployment  (on current medication regimen since April 2021) There has been steady improvement in increasing symptoms, and he denies irritability since utilizing coping skills.  Will continue current dose of sertraline and bupropion as maintenance treatment for depression.  Will continue hydroxyzine as needed for anxiety.    2. Insomnia, unspecified type - he uses CPAP machine Improving.  Will continue current dose of trazodone as needed for insomnia.    Plan Continue sertraline 150 mg daily  Continue bupropion 300 mg daily  Continue Trazodone 100 mg at night as needed for insomnia Continue hydroxyzine 25 mg daily as needed for anxiety Next appointment: 1/6 at 11:30   n.  He is not interested in transferring to [place] office anymore.   Past trials of medication: ?lexapro, venlafaxine, Abilify   The patient demonstrates the following risk factors for suicide: Chronic risk factors for suicide include: psychiatric disorder of depression. Acute risk factors for suicide include: family or marital conflict and loss (financial, interpersonal, professional). Protective factors for this patient include:  coping skills and hope for the future. Considering these factors, the overall suicide risk at this point appears to be low. Patient is appropriate for outpatient follow up.  Collaboration of Care: Collaboration of Care: {BH OP Collaboration of Care:21014065}  Patient/Guardian was advised Release of Information must be obtained prior to any record release in order to collaborate their care with an outside provider. Patient/Guardian was advised if they have not already done so to contact the registration department to sign all necessary forms in order for [place] to release information regarding their care.   Consent: Patient/Guardian  gives verbal consent for treatment and assignment of benefits for services provided during this visit. Patient/Guardian expressed understanding and agreed to proceed.    Neysa Hotter, MD 01/06/2024, 2:34 PM

## 2024-01-12 ENCOUNTER — Ambulatory Visit: Payer: Self-pay | Admitting: Psychiatry

## 2024-01-14 ENCOUNTER — Other Ambulatory Visit: Payer: Self-pay | Admitting: Orthopedic Surgery

## 2024-01-14 ENCOUNTER — Other Ambulatory Visit: Payer: Self-pay | Admitting: Psychiatry

## 2024-01-14 DIAGNOSIS — M1712 Unilateral primary osteoarthritis, left knee: Secondary | ICD-10-CM

## 2024-01-14 DIAGNOSIS — G8929 Other chronic pain: Secondary | ICD-10-CM

## 2024-01-14 NOTE — Telephone Encounter (Signed)
 Please contact him to schedule an in-person follow-up, as his appointment had to be rescheduled on our end. We will continue to fill his prescription until his next visit

## 2024-01-28 ENCOUNTER — Telehealth: Payer: Self-pay

## 2024-01-28 DIAGNOSIS — F3341 Major depressive disorder, recurrent, in partial remission: Secondary | ICD-10-CM

## 2024-01-28 MED ORDER — SERTRALINE HCL 100 MG PO TABS: 150.0000 mg | ORAL_TABLET | Freq: Every day | ORAL | 0 refills | Status: DC

## 2024-01-28 NOTE — Telephone Encounter (Signed)
 left message that rx was sent to the pharmacy and to check with the pharmacy later today.

## 2024-01-28 NOTE — Telephone Encounter (Signed)
 I have sent refill for sertraline to pharmacy as requested.

## 2024-01-28 NOTE — Telephone Encounter (Signed)
 received fax requesting a refill on the sertraline. pt was last seen on 10-7 next appt 4-1

## 2024-02-10 DIAGNOSIS — G4733 Obstructive sleep apnea (adult) (pediatric): Secondary | ICD-10-CM | POA: Diagnosis not present

## 2024-02-19 NOTE — Progress Notes (Signed)
 [facility] MD/PA/NP OP Progress Note  02/23/2024 10:40 AM Paul Webb  MRN:  [id number]  Chief Complaint:  Chief Complaint  Patient presents with   Follow-up   HPI:  This is a follow-up for depression and insomnia.  He states that he has been doing good.  His wife is a Designer, jewellery, and is doing a part-time for sitting to help a lady. They spend time, working on the acres of yard. He enjoys taking care of animals, and going to the church.  Although he may be impacted a little when there is a loss of his friend and other, he denies concern about his mood.  [place]  He enjoys connection with her grandchildren, and people at the church.  Although he may get irritated at times, he now knows how to handle it.  He sleeps well, taking trazodone every night.  He has not taken hydroxyzine, and denies any concern about anxiety.  He has good appetite.  He feels good about weight loss since being on Mounjaro.  He denies SI.  He agrees with the plan as outlined below.   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 Total of 8-10 grandchildren in [place], [place], in the area    Orange Readings from Last 3 Encounters:  02/23/24 195 lb 12.8 oz (88.8 kg)  08/31/23 200 lb 12.8 oz (91.1 kg)  07/20/23 201 lb (91.2 kg)   12/29/22 214 lb (97.1 kg)  10/06/22 216 lb 9.6 oz (98.2 kg)  07/10/22 233 lb 6.4 oz (105.9 kg)     Visit Diagnosis:    ICD-10-CM   1. MDD (major depressive disorder), recurrent, in full remission (HCC)  F33.42     2. Insomnia, unspecified type  G47.00     3. MDD (major depressive disorder), recurrent, in partial remission (HCC)  F33.41 sertraline (ZOLOFT) 100 MG tablet      Past Psychiatric History: Please see initial evaluation for full  details. I have reviewed the history. No updates at this time.     Past Medical History:  Past Medical History:  Diagnosis Date   Actinic keratosis 05/22/2021   Arthritis    Benign neoplasm of trunk 05/22/2021   Bursitis of right shoulder    Circadian rhythm sleep disturbance 09/05/2019   Degenerative tear of glenoid labrum of right shoulder    Dermatofibroma 05/22/2021   Deviated septum 01/15/2021   Dupuytren's disease of palm 08/17/2021   Erectile dysfunction 07/30/2012   Essential hypertension 07/30/2012   Fast heart beat    Gastro-esophageal reflux disease with esophagitis 07/30/2012   Generalized anxiety disorder 05/12/2017   GERD (gastroesophageal reflux disease)    Guaiac positive stools 08/03/2018   History of kidney stones    Hyperlipidemia 07/30/2012   Hypersomnia with long sleep time, idiopathic 09/05/2019   Intolerance of continuous positive airway pressure (CPAP) ventilation 05/08/2021   Lentigo 05/22/2021   Major depressive disorder    Nasal turbinate hypertrophy 01/15/2021   Obesity (BMI 30.0-34.9) 09/05/2019   OSA (obstructive sleep apnea)    severe; variable BiPAP use   Other benign neoplasm of skin of left upper limb, including shoulder 05/22/2021   Panic attack 07/30/2012   S/P arthroscopy of right shoulder 04/23/2021  Sebaceous hyperplasia 05/22/2021   Status post nasal septoplasty 05/08/2021   Treatment-emergent central sleep apnea 10/03/2021   Tremor    Type 2 diabetes mellitus 07/30/2012    Past Surgical History:  Procedure Laterality Date   COLONOSCOPY     COLONOSCOPY N/A 10/06/2018   Procedure: COLONOSCOPY;  Surgeon: Malissa Hippo, MD;  Location: [facility];  Service: Endoscopy;  Laterality: N/A;  1:25   DRUG INDUCED ENDOSCOPY N/A 02/27/2021   Procedure: DRUG INDUCED ENDOSCOPY;  Surgeon: Osborn Coho, MD;  Location: [facility];  Service: ENT;  Laterality: N/A;   FL INJ RT KNEE CT ARTHROGRAM ([facility] HX)     NASAL  SEPTOPLASTY W/ TURBINOPLASTY Bilateral 02/27/2021   Procedure: NASAL SEPTOPLASTY WITH TURBINATE REDUCTION;  Surgeon: Osborn Coho, MD;  Location:  SURGERY CENTER;  Service: ENT;  Laterality: Bilateral;   POLYPECTOMY  10/06/2018   Procedure: POLYPECTOMY;  Surgeon: Malissa Hippo, MD;  Location: [facility];  Service: Endoscopy;;  colon    Rod in lower leg     trauma   Rt hernia repair     SHOULDER ARTHROSCOPY Right 04/16/2021   Procedure: RIGHT SHOULDER ARTHOROSCOPY AND DEBRIDEMENT;  Surgeon: Vickki Hearing, MD;  Location: [facility];  Service: Orthopedics;  Laterality: Right;    Family Psychiatric History: Please see initial evaluation for full details. I have reviewed the history. No updates at this time.     Family History:  Family History  Adopted: Yes  Family history unknown: Yes    Social History:  Social History   Socioeconomic History   Marital status: Married    Spouse name: Clydie Braun   Number of children: Not on file   Years of education: 12   Highest education level: High school graduate  Occupational History   Occupation: Retired  Tobacco Use   Smoking status: Never   Smokeless tobacco: Never  Vaping Use   Vaping status: Never Used  Substance and Sexual Activity   Alcohol use: Never   Drug use: Never   Sexual activity: Not on file  Other Topics Concern   Not on file  Social History Narrative   Right Handed   Lives in one story with a basement    Drinks Caffeine    Lives with wife   retired   Chief Executive Officer Drivers of Corporate investment banker Strain: Not on Ship broker Insecurity: Not on file  Transportation Needs: Not on file  Physical Activity: Not on file  Stress: Not on file  Social Connections: Not on file    Allergies: No Known Allergies  Metabolic Disorder Labs: Lab Results  Component Value Date   HGBA1C 6.4 (H) 04/15/2021   MPG 136.98 04/15/2021   No results found for: "PROLACTIN" No results found for: "CHOL", "TRIG", "HDL",  "CHOLHDL", "VLDL", "LDLCALC" No results found for: "TSH"  Therapeutic Level Labs: No results found for: "LITHIUM" No results found for: "VALPROATE" No results found for: "CBMZ"  Current Medications: Current Outpatient Medications  Medication Sig Dispense Refill   aspirin EC 81 MG tablet Take 1 tablet (81 mg total) by mouth daily.     atenolol (TENORMIN) 50 MG tablet Take 50 mg by mouth at bedtime.      BREO ELLIPTA 100-25 MCG/INH AEPB Inhale 1 puff into the lungs daily.     buPROPion (WELLBUTRIN XL) 300 MG 24 hr tablet Take 1 tablet (300 mg total) by mouth daily. 30 tablet 5   cetirizine (ZYRTEC) 10 MG  tablet Take 10 mg by mouth at bedtime.     Cholecalciferol (VITAMIN D) 2000 units tablet Take 2,000 Units by mouth daily.     cyanocobalamin (VITAMIN B12) 1000 MCG tablet Take 1,000 mcg by mouth daily.     diazepam (VALIUM) 2 MG tablet Take 1 tab 30 minutes prior to MRI, may add an additional tab if needed 2 tablet 0   diclofenac (CATAFLAM) 50 MG tablet TAKE 1 TABLET BY MOUTH TWICE DAILY 60 tablet 3   lisinopril-hydrochlorothiazide (PRINZIDE,ZESTORETIC) 20-25 MG tablet Take 1 tablet by mouth daily.     MOUNJARO 2.5 MG/0.5ML Pen SMARTSIG:0.5 Milliliter(s) SUB-Q Once a Week     Multiple Vitamin (MULTIVITAMIN) tablet Take 1 tablet by mouth daily.     NOVOLOG 100 UNIT/ML injection Inject into the skin.     omeprazole (PRILOSEC) 40 MG capsule Take 40 mg by mouth daily.     pravastatin (PRAVACHOL) 80 MG tablet Take 80 mg by mouth at bedtime.      sertraline (ZOLOFT) 100 MG tablet Take 1.5 tablets (150 mg total) by mouth daily. 45 tablet 5   traZODone (DESYREL) 100 MG tablet Take 1 tablet (100 mg total) by mouth at bedtime as needed. 30 tablet 5   No current facility-administered medications for this visit.     Musculoskeletal: Strength & Muscle Tone: within normal limits Gait & Station: normal Patient leans: N/A  Psychiatric Specialty Exam: Review of Systems  Psychiatric/Behavioral:   Negative for agitation, behavioral problems, confusion, decreased concentration, dysphoric mood, hallucinations, self-injury, sleep disturbance and suicidal ideas. The patient is not nervous/anxious and is not hyperactive.   All other systems reviewed and are negative.   Blood pressure 118/78, pulse 76, temperature 98.7 F (37.1 C), temperature source Temporal, height 5\' 11"  (1.803 m), weight 195 lb 12.8 oz (88.8 kg), SpO2 95%.Body mass index is 27.31 kg/m.  General Appearance: Well Groomed  Eye Contact:  Good  Speech:  Clear and Coherent  Volume:  Normal  Mood:   good  Affect:  Appropriate, Congruent, and Full Range  Thought Process:  Coherent  Orientation:  Full (Time, Place, and Person)  Thought Content: Logical   Suicidal Thoughts:  No  Homicidal Thoughts:  No  Memory:  Immediate;   Good  Judgement:  Good  Insight:  Good  Psychomotor Activity:  Normal  Concentration:  Concentration: Good and Attention Span: Good  Recall:  Good  Fund of Knowledge: Good  Language: Good  Akathisia:  No  Handed:  Right  AIMS (if indicated): not done  Assets:  Communication Skills Desire for Improvement  ADL's:  Intact  Cognition: WNL  Sleep:  Good   Screenings: GAD-7    Flowsheet Row Office Visit from 02/23/2024 in [facility] Office Visit from 12/29/2022 in [facility] Office Visit from 10/06/2022 in [facility] Office Visit from 07/10/2022 in [facility]  Total GAD-7 Score 0 0 3 12      PHQ2-9    Flowsheet Row Office Visit from 02/23/2024 in [facility] Office Visit from 12/29/2022 in [facility] Office Visit from 10/06/2022 in [facility] Office Visit from 07/10/2022 in [facility] Video Visit from 12/17/2021 in [facility]  PHQ-2 Total Score 0 0 0 0 0      Flowsheet Row Office  Visit from 02/23/2024 in [facility] Office Visit from 12/29/2022 in [facility] Office Visit from 10/06/2022 in [facility]  C-SSRS RISK CATEGORY No Risk No Risk No Risk        Assessment and Plan:  Paul Webb is a 68 y.o. year old male with a history of depression, anxiety,  diabetes, hyperlipidemia, diabetes, severe sleep apnea (on BiPAP), who presents for follow up appointment for below.   1. MDD (major depressive disorder), recurrent, in full remission (HCC) Acute stressors include: memory loss Other stressors include: retired,previously unemployed due to being in insulin pump   History: previously on venlafaxine for anxiety for 20 years, worsened after unemployment  (on current medication regimen since April 2021) He denies any significant mood symptoms including irritability, and has been able to utilize coping skills.  Will continue current dose of sertraline and bupropion as maintenance treatment for depression.  Noted that he is not interested in lowering either of the medication.  Will continue hydroxyzine as needed for anxiety.   2. Insomnia, unspecified type - he uses CPAP machine  Stable.  Will continue current dose of trazodone as needed for insomnia.    Plan Continue sertraline 150 mg daily  Continue bupropion 300 mg daily  Continue Trazodone 100 mg at night as needed for insomnia Continue hydroxyzine 25 mg daily as needed for anxiety Next appointment: 7/1 at 11:30, IP - He is not interested in transferring to [place] office anymore.  - on monjouaro   Past trials of medication: ?lexapro, venlafaxine, Abilify   The patient demonstrates the following risk factors for suicide: Chronic risk factors  for suicide include: psychiatric disorder of depression. Acute risk factors for suicide include: family or marital conflict and loss (financial, interpersonal, professional). Protective factors for this patient include: coping skills and hope for the future. Considering these factors, the overall suicide risk at this point appears to be low. Patient is appropriate for outpatient follow up.  Collaboration of Care: Collaboration of Care: Other reviewed notes in Epic  Patient/Guardian was advised Release of Information must be obtained prior to any record release in order to collaborate their care with an outside provider. Patient/Guardian was advised if they have not already done so to contact the registration department to sign all necessary forms in order for [place] to release information regarding their care.   Consent: Patient/Guardian gives verbal consent for treatment and assignment of benefits for services provided during this visit. Patient/Guardian expressed understanding and agreed to proceed.    Neysa Hotter, MD 02/23/2024, 10:40 AM

## 2024-02-23 ENCOUNTER — Ambulatory Visit (INDEPENDENT_AMBULATORY_CARE_PROVIDER_SITE_OTHER): Payer: Medicare PPO | Admitting: Psychiatry

## 2024-02-23 ENCOUNTER — Encounter: Payer: Self-pay | Admitting: Psychiatry

## 2024-02-23 VITALS — BP 118/78 | HR 76 | Temp 98.7°F | Ht 71.0 in | Wt 195.8 lb

## 2024-02-23 DIAGNOSIS — F3342 Major depressive disorder, recurrent, in full remission: Secondary | ICD-10-CM

## 2024-02-23 DIAGNOSIS — G4700 Insomnia, unspecified: Secondary | ICD-10-CM

## 2024-02-23 DIAGNOSIS — F3341 Major depressive disorder, recurrent, in partial remission: Secondary | ICD-10-CM | POA: Diagnosis not present

## 2024-02-23 MED ORDER — SERTRALINE HCL 100 MG PO TABS
150.0000 mg | ORAL_TABLET | Freq: Every day | ORAL | 5 refills | Status: DC
Start: 2024-02-23 — End: 2024-08-16

## 2024-02-23 MED ORDER — TRAZODONE HCL 100 MG PO TABS: 100.0000 mg | ORAL_TABLET | Freq: Every evening | ORAL | 5 refills | Status: DC | PRN

## 2024-02-23 NOTE — Patient Instructions (Signed)
 Continue sertraline 150 mg daily  Continue bupropion 300 mg daily  Continue Trazodone 100 mg at night as needed for insomnia Continue hydroxyzine 25 mg daily as needed for anxiety Next appointment: 7/1 at 11:30

## 2024-03-16 DIAGNOSIS — E559 Vitamin D deficiency, unspecified: Secondary | ICD-10-CM | POA: Diagnosis not present

## 2024-03-16 DIAGNOSIS — Z9641 Presence of insulin pump (external) (internal): Secondary | ICD-10-CM | POA: Diagnosis not present

## 2024-03-16 DIAGNOSIS — E1065 Type 1 diabetes mellitus with hyperglycemia: Secondary | ICD-10-CM | POA: Diagnosis not present

## 2024-03-16 DIAGNOSIS — Z978 Presence of other specified devices: Secondary | ICD-10-CM | POA: Diagnosis not present

## 2024-03-16 DIAGNOSIS — E785 Hyperlipidemia, unspecified: Secondary | ICD-10-CM | POA: Diagnosis not present

## 2024-03-23 DIAGNOSIS — E1065 Type 1 diabetes mellitus with hyperglycemia: Secondary | ICD-10-CM | POA: Diagnosis not present

## 2024-04-12 DIAGNOSIS — Z1331 Encounter for screening for depression: Secondary | ICD-10-CM | POA: Diagnosis not present

## 2024-04-12 DIAGNOSIS — Z299 Encounter for prophylactic measures, unspecified: Secondary | ICD-10-CM | POA: Diagnosis not present

## 2024-04-12 DIAGNOSIS — J449 Chronic obstructive pulmonary disease, unspecified: Secondary | ICD-10-CM | POA: Diagnosis not present

## 2024-04-12 DIAGNOSIS — F321 Major depressive disorder, single episode, moderate: Secondary | ICD-10-CM | POA: Diagnosis not present

## 2024-04-12 DIAGNOSIS — I739 Peripheral vascular disease, unspecified: Secondary | ICD-10-CM | POA: Diagnosis not present

## 2024-04-12 DIAGNOSIS — Z0000 Encounter for general adult medical examination without abnormal findings: Secondary | ICD-10-CM | POA: Diagnosis not present

## 2024-04-12 DIAGNOSIS — Z1339 Encounter for screening examination for other mental health and behavioral disorders: Secondary | ICD-10-CM | POA: Diagnosis not present

## 2024-04-12 DIAGNOSIS — Z7189 Other specified counseling: Secondary | ICD-10-CM | POA: Diagnosis not present

## 2024-05-04 DIAGNOSIS — L821 Other seborrheic keratosis: Secondary | ICD-10-CM | POA: Diagnosis not present

## 2024-05-04 DIAGNOSIS — B360 Pityriasis versicolor: Secondary | ICD-10-CM | POA: Diagnosis not present

## 2024-05-04 DIAGNOSIS — L281 Prurigo nodularis: Secondary | ICD-10-CM | POA: Diagnosis not present

## 2024-05-15 ENCOUNTER — Other Ambulatory Visit: Payer: Self-pay | Admitting: Orthopedic Surgery

## 2024-05-15 DIAGNOSIS — M1712 Unilateral primary osteoarthritis, left knee: Secondary | ICD-10-CM

## 2024-05-15 DIAGNOSIS — G8929 Other chronic pain: Secondary | ICD-10-CM

## 2024-05-20 ENCOUNTER — Other Ambulatory Visit: Payer: Self-pay | Admitting: Psychiatry

## 2024-05-21 NOTE — Progress Notes (Unsigned)
 [facility] MD/PA/NP OP Progress Note  05/24/2024 12:04 PM Paul Webb  MRN:  [id number]  Chief Complaint:  Chief Complaint  Patient presents with   Follow-up   HPI:  This is a follow-up appointment for depression and insomnia.  He states that he has been doing well.  He is remodeling the house to be rented. He painted a car for his son.  He also enjoys working on his car.  He goes to church a few times per week.  He reports good connection and community there.  His wife has been doing good.  He wishes to have more energy.  He occasionally works 12 hours for the house.  He tries to listen to his body, and to get some rest.  He usually feels better afterwards. This also helps for irritability. He thinks the current level of energy is what is expected for his age, and denies much concern.  He has occasional insomnia due to having difficulty with mask.  He occasionally takes the away as he would rather do that way without being bothered.  Has been taking care of his medication including vitamin D, referring that she is a Engineer, civil (consulting).  He denies feeling depressed or anxiety.  He has good appetite.  He feels good about weight loss since being on Mounjaro.  He denies SI, HI, hallucinations.  He denies alcohol  use or drug use.  He agrees with the plans as outlined below.    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 Total of 8-10 grandchildren in [place] , [place] , in the area  Visit Diagnosis:    ICD-10-CM   1. MDD (major depressive disorder), recurrent, in full remission (HCC)  F33.42     2. Insomnia, unspecified type  G47.00       Past Psychiatric  History: Please see initial evaluation for full details. I have reviewed the history. No updates at this time.     Past Medical History:  Past Medical History:  Diagnosis Date   Actinic keratosis 05/22/2021   Arthritis    Benign neoplasm of trunk 05/22/2021   Bursitis of right shoulder    Circadian rhythm sleep disturbance 09/05/2019   Degenerative tear of glenoid labrum of right shoulder    Dermatofibroma 05/22/2021   Deviated septum 01/15/2021   Dupuytren's disease of palm 08/17/2021   Erectile dysfunction 07/30/2012   Essential hypertension 07/30/2012   Fast heart beat    Gastro-esophageal reflux disease with esophagitis 07/30/2012   Generalized anxiety disorder 05/12/2017   GERD (gastroesophageal reflux disease)    Guaiac positive stools 08/03/2018   History of kidney stones    Hyperlipidemia 07/30/2012   Hypersomnia with long sleep time, idiopathic 09/05/2019   Intolerance of continuous positive airway pressure (CPAP) ventilation 05/08/2021   Lentigo 05/22/2021   Major depressive disorder    Nasal turbinate hypertrophy 01/15/2021   Obesity (BMI 30.0-34.9) 09/05/2019   OSA (obstructive sleep apnea)    severe; variable BiPAP use   Other benign neoplasm of skin of left upper limb, including shoulder 05/22/2021   Panic attack 07/30/2012   S/P arthroscopy  of right shoulder 04/23/2021   Sebaceous hyperplasia 05/22/2021   Status post nasal septoplasty 05/08/2021   Treatment-emergent central sleep apnea 10/03/2021   Tremor    Type 2 diabetes mellitus 07/30/2012    Past Surgical History:  Procedure Laterality Date   COLONOSCOPY     COLONOSCOPY N/A 10/06/2018   Procedure: COLONOSCOPY;  Surgeon: Golda Claudis PENNER, MD;  Location: [facility];  Service: Endoscopy;  Laterality: N/A;  1:25   DRUG INDUCED ENDOSCOPY N/A 02/27/2021   Procedure: DRUG INDUCED ENDOSCOPY;  Surgeon: Mable Lenis, MD;  Location: [facility];  Service: ENT;  Laterality: N/A;   FL INJ RT  KNEE CT ARTHROGRAM ([facility] HX)     NASAL SEPTOPLASTY W/ TURBINOPLASTY Bilateral 02/27/2021   Procedure: NASAL SEPTOPLASTY WITH TURBINATE REDUCTION;  Surgeon: Mable Lenis, MD;  Location: [facility];  Service: ENT;  Laterality: Bilateral;   POLYPECTOMY  10/06/2018   Procedure: POLYPECTOMY;  Surgeon: Golda Claudis PENNER, MD;  Location: [facility];  Service: Endoscopy;;  colon    Rod in lower leg     trauma   Rt hernia repair     SHOULDER ARTHROSCOPY Right 04/16/2021   Procedure: RIGHT SHOULDER ARTHOROSCOPY AND DEBRIDEMENT;  Surgeon: Margrette Taft BRAVO, MD;  Location: [facility];  Service: Orthopedics;  Laterality: Right;    Family Psychiatric History: Please see initial evaluation for full details. I have reviewed the history. No updates at this time.     Family History:  Family History  Adopted: Yes  Family history unknown: Yes    Social History:  Social History   Socioeconomic History   Marital status: Married    Spouse name: Paul Webb   Number of children: Not on file   Years of education: 12   Highest education level: High school graduate  Occupational History   Occupation: Retired  Tobacco Use   Smoking status: Never   Smokeless tobacco: Never  Vaping Use   Vaping status: Never Used  Substance and Sexual Activity   Alcohol  use: Never   Drug use: Never   Sexual activity: Not on file  Other Topics Concern   Not on file  Social History Narrative   Right Handed   Lives in one story with a basement    Drinks Caffeine    Lives with wife   retired   Chief Executive Officer Drivers of Corporate investment banker Strain: Not on Ship broker Insecurity: Not on file  Transportation Needs: Not on file  Physical Activity: Not on file  Stress: Not on file  Social Connections: Not on file    Allergies: No Known Allergies  Metabolic Disorder Labs: Lab Results  Component Value Date   HGBA1C 6.4 (H) 04/15/2021   MPG 136.98 04/15/2021   No results found for: PROLACTIN No  results found for: CHOL, TRIG, HDL, CHOLHDL, VLDL, LDLCALC No results found for: TSH  Therapeutic Level Labs: No results found for: LITHIUM No results found for: VALPROATE No results found for: CBMZ  Current Medications: Current Outpatient Medications  Medication Sig Dispense Refill   aspirin EC 81 MG tablet Take 1 tablet (81 mg total) by mouth daily.     atenolol (TENORMIN) 50 MG tablet Take 50 mg by mouth at bedtime.      BREO ELLIPTA 100-25 MCG/INH AEPB Inhale 1 puff into the lungs daily.     [START ON 05/25/2024] buPROPion  (WELLBUTRIN  XL) 300 MG 24 hr tablet Take 1 tablet (300 mg total) by mouth daily.  30 tablet 5   cetirizine (ZYRTEC) 10 MG tablet Take 10 mg by mouth at bedtime.     Cholecalciferol (VITAMIN D) 2000 units tablet Take 2,000 Units by mouth daily.     cyanocobalamin  (VITAMIN B12) 1000 MCG tablet Take 1,000 mcg by mouth daily.     diazepam  (VALIUM ) 2 MG tablet Take 1 tab 30 minutes prior to MRI, may add an additional tab if needed 2 tablet 0   diclofenac  (CATAFLAM ) 50 MG tablet TAKE 1 TABLET BY MOUTH TWICE DAILY 60 tablet 3   lisinopril-hydrochlorothiazide (PRINZIDE,ZESTORETIC) 20-25 MG tablet Take 1 tablet by mouth daily.     MOUNJARO 7.5 MG/0.5ML Pen      Multiple Vitamin (MULTIVITAMIN) tablet Take 1 tablet by mouth daily.     NOVOLOG 100 UNIT/ML injection Inject into the skin.     omeprazole (PRILOSEC) 40 MG capsule Take 40 mg by mouth daily.     pravastatin (PRAVACHOL) 80 MG tablet Take 80 mg by mouth at bedtime.      sertraline  (ZOLOFT ) 100 MG tablet Take 1.5 tablets (150 mg total) by mouth daily. 45 tablet 5   traZODone  (DESYREL ) 100 MG tablet Take 1 tablet (100 mg total) by mouth at bedtime as needed. 30 tablet 5   No current facility-administered medications for this visit.     Musculoskeletal: Strength & Muscle Tone: within normal limits Gait & Station: normal Patient leans: N/A  Psychiatric Specialty Exam: Review of Systems   Psychiatric/Behavioral:  Positive for sleep disturbance. Negative for agitation, behavioral problems, confusion, decreased concentration, dysphoric mood, hallucinations, self-injury and suicidal ideas. The patient is not nervous/anxious and is not hyperactive.   All other systems reviewed and are negative.   Blood pressure 118/70, pulse 75, temperature 98.3 F (36.8 C), temperature source Temporal, height 5' 11 (1.803 m), weight 189 lb 12.8 oz (86.1 kg), SpO2 99%.Body mass index is 26.47 kg/m.  General Appearance: Well Groomed  Eye Contact:  Good  Speech:  Clear and Coherent  Volume:  Normal  Mood:  good  Affect:  Appropriate, Congruent, and Full Range  Thought Process:  Coherent  Orientation:  Full (Time, Place, and Person)  Thought Content: Logical   Suicidal Thoughts:  No  Homicidal Thoughts:  No  Memory:  Immediate;   Good  Judgement:  Good  Insight:  Good  Psychomotor Activity:  Normal  Concentration:  Concentration: Good and Attention Span: Good  Recall:  Good  Fund of Knowledge: Good  Language: Good  Akathisia:  No  Handed:  Right  AIMS (if indicated): not done  Assets:  Communication Skills Desire for Improvement  ADL's:  Intact  Cognition: WNL  Sleep:  Fair   Screenings: GAD-7    Garment/textile technologist Visit from 02/23/2024 in [facility] Office Visit from 12/29/2022 in [facility] Visit from 10/06/2022 in [facility] Visit from 07/10/2022 in [facility]  Total GAD-7 Score 0 0 3 12   PHQ2-9    Flowsheet Row Office Visit from 02/23/2024 in [facility] Visit from 12/29/2022 in [facility] Visit from 10/06/2022 in [facility] Office Visit from 07/10/2022 in [facility] Video Visit from 12/17/2021 in [facility]  PHQ-2 Total Score 0 0 0 0 0   Flowsheet Row Office Visit  from 02/23/2024 in [facility] Office Visit from 12/29/2022 in [facility] Office Visit from 10/06/2022 in [facility]  C-SSRS RISK CATEGORY No Risk No Risk No Risk     Assessment and Plan:  Paul Webb is a 68 y.o. year old male with a history of depression, anxiety,  diabetes, hyperlipidemia, diabetes, severe sleep apnea (on BiPAP), who presents for follow up appointment for below.   1. MDD (major depressive disorder), recurrent, in full remission (HCC) Acute stressors include: memory loss Other stressors include: retired,previously unemployed due to being in insulin  pump   History: previously on venlafaxine  for anxiety for 20 years, worsened after unemployment  (on current medication regimen since April 2021) He denies any mood symptoms except fatigue since the last visit.  He is able to utilize coping skills.  Will continue current dose of sertraline  and bupropion  as maintenance treatment for depression.  Noted that he is not interested in lowering either of the medication.  Will continue hydroxyzine  as needed for anxiety.   2. Insomnia, unspecified type - he uses CPAP machine   He reports slight worsening in middle insomnia and having difficulty with adjusting CPAP mask. He has a few appliances, and is currently trying to find the right fit.  Will continue trazodone  as needed for insomnia.   # fatigue Although this is multifactorial, he denies any associated mood symptoms.  He reportedly had a wellness visit a few months ago, and takes vitamin D regularly. He also sees endocrinologist every 3 months, and is thyroid  is likely reportedly normal.  He agrees to continue to work on sleep hygiene  as outlined above.   Plan Continue sertraline  150 mg daily  Continue bupropion  300 mg daily  Continue Trazodone  100 mg at night as needed for insomnia Continue hydroxyzine  25 mg daily as needed for anxiety Next appointment: 10/2 at 11 am, IP - He is not interested in transferring to [place] office anymore.  - on monjouaro  - seen by Dr. Rosamond   Past trials of medication: ?lexapro, venlafaxine , Abilify    The patient demonstrates the following risk factors for suicide: Chronic risk factors for suicide include: psychiatric disorder of depression. Acute risk factors for suicide include: family or marital conflict and loss (financial, interpersonal, professional). Protective factors for this patient include: coping skills and hope for the future. Considering these factors, the overall suicide risk at this point appears to be low. Patient is appropriate for outpatient follow up.  Collaboration of Care: Collaboration of Care: Other reviewed notes in Epic  Patient/Guardian was advised Release of Information must be obtained prior to any record release in order to collaborate their care with an outside provider. Patient/Guardian was advised if they have not already done so to contact the registration department to sign all necessary forms in order for us  to release information regarding their care.   Consent: Patient/Guardian gives verbal consent for treatment and assignment of benefits for services provided during this visit. Patient/Guardian expressed understanding and agreed to proceed.    Katheren Sleet, MD 05/24/2024, 12:04 PM

## 2024-05-24 ENCOUNTER — Encounter: Payer: Self-pay | Admitting: Psychiatry

## 2024-05-24 ENCOUNTER — Ambulatory Visit (INDEPENDENT_AMBULATORY_CARE_PROVIDER_SITE_OTHER): Admitting: Psychiatry

## 2024-05-24 VITALS — BP 118/70 | HR 75 | Temp 98.3°F | Ht 71.0 in | Wt 189.8 lb

## 2024-05-24 DIAGNOSIS — G4700 Insomnia, unspecified: Secondary | ICD-10-CM

## 2024-05-24 DIAGNOSIS — F3342 Major depressive disorder, recurrent, in full remission: Secondary | ICD-10-CM

## 2024-05-24 NOTE — Patient Instructions (Signed)
 Continue sertraline  150 mg daily  Continue bupropion  300 mg daily  Continue Trazodone  100 mg at night as needed for insomnia Continue hydroxyzine  25 mg daily as needed for anxiety Next appointment: 10/2 at 11 am

## 2024-05-26 DIAGNOSIS — G4733 Obstructive sleep apnea (adult) (pediatric): Secondary | ICD-10-CM | POA: Diagnosis not present

## 2024-06-14 DIAGNOSIS — E1065 Type 1 diabetes mellitus with hyperglycemia: Secondary | ICD-10-CM | POA: Diagnosis not present

## 2024-07-11 DIAGNOSIS — E785 Hyperlipidemia, unspecified: Secondary | ICD-10-CM | POA: Diagnosis not present

## 2024-07-11 DIAGNOSIS — E1065 Type 1 diabetes mellitus with hyperglycemia: Secondary | ICD-10-CM | POA: Diagnosis not present

## 2024-07-11 DIAGNOSIS — Z978 Presence of other specified devices: Secondary | ICD-10-CM | POA: Diagnosis not present

## 2024-07-11 DIAGNOSIS — E559 Vitamin D deficiency, unspecified: Secondary | ICD-10-CM | POA: Diagnosis not present

## 2024-07-11 DIAGNOSIS — Z9641 Presence of insulin pump (external) (internal): Secondary | ICD-10-CM | POA: Diagnosis not present

## 2024-08-16 ENCOUNTER — Other Ambulatory Visit: Payer: Self-pay | Admitting: Psychiatry

## 2024-08-16 DIAGNOSIS — F3341 Major depressive disorder, recurrent, in partial remission: Secondary | ICD-10-CM

## 2024-08-25 ENCOUNTER — Ambulatory Visit: Admitting: Psychiatry

## 2024-08-29 NOTE — Progress Notes (Signed)
 [facility] MD/PA/NP OP Progress Note  09/05/2024 11:41 AM Paul Webb  MRN:  [id number]  Chief Complaint:  Chief Complaint  Patient presents with   Follow-up   HPI:  This is a follow-up appointment for depression and insomnia.  He states that he has been doing very well.  He has been working on a car, doing restoring.  He believes his energy level is a lot better since weight loss.  He has been working on by himself, and it helps him to clear his mind.  He reports good relationship with his wife.  He believes his irritability has been better.  He tries to stay away from the trigger.  Although he used to be stressed from driving, he is now going with the flow.  He reports good sleep.  He denies feeling depressed.  He has good appetite.  He denies SI, HI, hallucinations.  He prefers to stay on the current medication regimen unless there is any concern.  He agrees with the plans as outlined.   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 Total of 8-10 grandchildren in [place] , [place] , in the area  Visit Diagnosis:    ICD-10-CM   1. MDD (major depressive disorder), recurrent, in full remission  F33.42     2. Insomnia, unspecified type  G47.00     3. MDD (major depressive disorder), recurrent, in partial remission  F33.41 sertraline  (ZOLOFT ) 100 MG tablet      Past Psychiatric History: Please see initial evaluation for full details. I have reviewed the history. No updates at this time.     Past Medical History:  Past Medical History:  Diagnosis Date   Actinic keratosis 05/22/2021   Arthritis    Benign neoplasm of trunk 05/22/2021    Bursitis of right shoulder    Circadian rhythm sleep disturbance 09/05/2019   Degenerative tear of glenoid labrum of right shoulder    Dermatofibroma 05/22/2021   Deviated septum 01/15/2021   Dupuytren's disease of palm 08/17/2021   Erectile dysfunction 07/30/2012   Essential hypertension 07/30/2012   Fast heart beat    Gastro-esophageal reflux disease with esophagitis 07/30/2012   Generalized anxiety disorder 05/12/2017   GERD (gastroesophageal reflux disease)    Guaiac positive stools 08/03/2018   History of kidney stones    Hyperlipidemia 07/30/2012   Hypersomnia with long sleep time, idiopathic 09/05/2019   Intolerance of continuous positive airway pressure (CPAP) ventilation 05/08/2021   Lentigo 05/22/2021   Major depressive disorder    Nasal turbinate hypertrophy 01/15/2021   Obesity (BMI 30.0-34.9) 09/05/2019   OSA (obstructive sleep apnea)    severe; variable BiPAP use   Other benign neoplasm of skin of left upper limb, including shoulder 05/22/2021   Panic attack 07/30/2012   S/P arthroscopy of right shoulder 04/23/2021   Sebaceous hyperplasia 05/22/2021   Status post nasal septoplasty 05/08/2021   Treatment-emergent central sleep apnea 10/03/2021   Tremor    Type 2 diabetes mellitus 07/30/2012    Past Surgical History:  Procedure Laterality Date   COLONOSCOPY     COLONOSCOPY N/A 10/06/2018  Procedure: COLONOSCOPY;  Surgeon: Golda Claudis PENNER, MD;  Location: [facility];  Service: Endoscopy;  Laterality: N/A;  1:25   DRUG INDUCED ENDOSCOPY N/A 02/27/2021   Procedure: DRUG INDUCED ENDOSCOPY;  Surgeon: Mable Lenis, MD;  Location: [facility];  Service: ENT;  Laterality: N/A;   FL INJ RT KNEE CT ARTHROGRAM ([facility] HX)     NASAL SEPTOPLASTY W/ TURBINOPLASTY Bilateral 02/27/2021   Procedure: NASAL SEPTOPLASTY WITH TURBINATE REDUCTION;  Surgeon: Mable Lenis, MD;  Location: [facility];  Service: ENT;  Laterality: Bilateral;   POLYPECTOMY   10/06/2018   Procedure: POLYPECTOMY;  Surgeon: Golda Claudis PENNER, MD;  Location: [facility];  Service: Endoscopy;;  colon    Rod in lower leg     trauma   Rt hernia repair     SHOULDER ARTHROSCOPY Right 04/16/2021   Procedure: RIGHT SHOULDER ARTHOROSCOPY AND DEBRIDEMENT;  Surgeon: Margrette Taft BRAVO, MD;  Location: [facility];  Service: Orthopedics;  Laterality: Right;    Family Psychiatric History: Please see initial evaluation for full details. I have reviewed the history. No updates at this time.     Family History:  Family History  Adopted: Yes  Family history unknown: Yes    Social History:  Social History   Socioeconomic History   Marital status: Married    Spouse name: Darice   Number of children: Not on file   Years of education: 12   Highest education level: High school graduate  Occupational History   Occupation: Retired  Tobacco Use   Smoking status: Never   Smokeless tobacco: Never  Vaping Use   Vaping status: Never Used  Substance and Sexual Activity   Alcohol  use: Never   Drug use: Never   Sexual activity: Not on file  Other Topics Concern   Not on file  Social History Narrative   Right Handed   Lives in one story with a basement    Drinks Caffeine    Lives with wife   retired   Chief Executive Officer [employer] of Corporate investment banker Strain: Not on Ship broker Insecurity: Not on file  Transportation Needs: Not on file  Physical Activity: Not on file  Stress: Not on file  Social Connections: Not on file    Allergies: No Known Allergies  Metabolic Disorder Labs: Lab Results  Component Value Date   HGBA1C 6.4 (H) 04/15/2021   MPG 136.98 04/15/2021   No results found for: PROLACTIN No results found for: CHOL, TRIG, HDL, CHOLHDL, VLDL, LDLCALC No results found for: TSH  Therapeutic Level Labs: No results found for: LITHIUM No results found for: VALPROATE No results found for: CBMZ  Current Medications: Current Outpatient  Medications  Medication Sig Dispense Refill   hydrOXYzine  (VISTARIL ) 25 MG capsule Take 25 mg by mouth daily as needed for anxiety.     aspirin EC 81 MG tablet Take 1 tablet (81 mg total) by mouth daily.     atenolol (TENORMIN) 50 MG tablet Take 50 mg by mouth at bedtime.      BREO ELLIPTA 100-25 MCG/INH AEPB Inhale 1 puff into the lungs daily.     buPROPion  (WELLBUTRIN  XL) 300 MG 24 hr tablet Take 1 tablet (300 mg total) by mouth daily. 30 tablet 5   cetirizine (ZYRTEC) 10 MG tablet Take 10 mg by mouth at bedtime.     Cholecalciferol (VITAMIN D) 2000 units tablet Take 2,000 Units by mouth daily.     cyanocobalamin  (VITAMIN B12)  1000 MCG tablet Take 1,000 mcg by mouth daily.     diazepam  (VALIUM ) 2 MG tablet Take 1 tab 30 minutes prior to MRI, may add an additional tab if needed 2 tablet 0   diclofenac  (CATAFLAM ) 50 MG tablet TAKE 1 TABLET BY MOUTH TWICE DAILY 60 tablet 3   lisinopril-hydrochlorothiazide (PRINZIDE,ZESTORETIC) 20-25 MG tablet Take 1 tablet by mouth daily.     MOUNJARO 7.5 MG/0.5ML Pen      Multiple Vitamin (MULTIVITAMIN) tablet Take 1 tablet by mouth daily.     NOVOLOG 100 UNIT/ML injection Inject into the skin.     omeprazole (PRILOSEC) 40 MG capsule Take 40 mg by mouth daily.     pravastatin (PRAVACHOL) 80 MG tablet Take 80 mg by mouth at bedtime.      [START ON 09/15/2024] sertraline  (ZOLOFT ) 100 MG tablet Take 1.5 tablets (150 mg total) by mouth daily. 45 tablet 5   traZODone  (DESYREL ) 100 MG tablet Take 1 tablet (100 mg total) by mouth at bedtime as needed. 30 tablet 5   No current facility-administered medications for this visit.     Musculoskeletal: Strength & Muscle Tone: within normal limits Gait & Station: normal Patient leans: N/A  Psychiatric Specialty Exam: Review of Systems  Psychiatric/Behavioral:  Negative for agitation, behavioral problems, confusion, decreased concentration, dysphoric mood, hallucinations, self-injury, sleep disturbance and suicidal  ideas. The patient is not nervous/anxious and is not hyperactive.   All other systems reviewed and are negative.   Blood pressure 125/70, pulse 80, temperature 97.7 F (36.5 C), temperature source Temporal, height 5' 11 (1.803 m), weight 194 lb 6.4 oz (88.2 kg).Body mass index is 27.11 kg/m.  General Appearance: Well Groomed  Eye Contact:  Good  Speech:  Clear and Coherent  Volume:  Normal  Mood:  good  Affect:  Appropriate, Congruent, and Full Range  Thought Process:  Coherent  Orientation:  Full (Time, Place, and Person)  Thought Content: Logical   Suicidal Thoughts:  No  Homicidal Thoughts:  No  Memory:  Immediate;   Good  Judgement:  Good  Insight:  Good  Psychomotor Activity:  Normal  Concentration:  Concentration: Good and Attention Span: Good  Recall:  Good  Fund of Knowledge: Good  Language: Good  Akathisia:  No  Handed:  Right  AIMS (if indicated): not done  Assets:  Communication Skills Desire for Improvement  ADL's:  Intact  Cognition: WNL  Sleep:  Good   Screenings: GAD-7    Flowsheet Row Office Visit from 02/23/2024 in [facility] Office Visit from 12/29/2022 in [facility] Office Visit from 10/06/2022 in [facility] Office Visit from 07/10/2022 in [facility]  Total GAD-7 Score 0 0 3 12   PHQ2-9    Flowsheet Row Office Visit from 02/23/2024 in [facility] Office Visit from 12/29/2022 in [facility] Office Visit from 10/06/2022 in [facility] Office Visit from 07/10/2022 in [facility] Video Visit from 12/17/2021 in [facility]  PHQ-2 Total Score 0 0 0 0 0   Flowsheet Row Office Visit from 02/23/2024 in [facility] Office Visit from 12/29/2022 in [facility] Office Visit from 10/06/2022 in [facility]  C-SSRS RISK CATEGORY No Risk No Risk No Risk  Assessment and Plan:  WILL HEINKEL is a 68 y.o. year old male with a history of depression, anxiety,  diabetes, hyperlipidemia, diabetes, severe sleep apnea (on BiPAP), who presents for follow up appointment for below.   1. MDD (major depressive disorder), recurrent, in full remission History: previously on venlafaxine  for anxiety for 20 years, worsened after unemployment  (on current medication regimen since April 2021) He denies any mood symptoms since the last visit with minimal irritability since utilizing coping skills.  Although it was discussed to consider monotherapy, he reports preference to stay on the current medication regimen.  Will continue current dose of sertraline  to target depression along with bupropion  as adjunctive treatment for depression.  Will continue hydroxyzine  as needed for anxiety.   2. Insomnia, unspecified type - he uses CPAP machine    Stable.  Will continue current dose of trazodone  as needed for insomnia.   # fatigue Overall improvement, which he attributes to weight loss, Will continue to assess and intervene as needed.    Plan Continue sertraline  150 mg daily  Continue bupropion  300 mg daily  Continue Trazodone  100 mg at night as needed for insomnia Continue hydroxyzine  25 mg daily as needed for anxiety Next appointment: 2/9 at 11:30, IP - He is not interested in transferring to [place] office anymore.  - on monjouaro  - seen by Dr. Rosamond   Past trials of medication: ?lexapro, venlafaxine , Abilify    The patient demonstrates the following risk factors for suicide: Chronic risk factors for suicide include: psychiatric disorder of depression. Acute risk factors for suicide include: family  or marital conflict and loss (financial, interpersonal, professional). Protective factors for this patient include: coping skills and hope for the future. Considering these factors, the overall suicide risk at this point appears to be low. Patient is appropriate for outpatient follow up.  Collaboration of Care: Collaboration of Care: Other reviewed notes in Epic  Patient/Guardian was advised Release of Information must be obtained prior to any record release in order to collaborate their care with an outside provider. Patient/Guardian was advised if they have not already done so to contact the registration department to sign all necessary forms in order for us  to release information regarding their care.   Consent: Patient/Guardian gives verbal consent for treatment and assignment of benefits for services provided during this visit. Patient/Guardian expressed understanding and agreed to proceed.    Katheren Sleet, MD 09/05/2024, 11:41 AM

## 2024-09-05 ENCOUNTER — Other Ambulatory Visit: Payer: Self-pay

## 2024-09-05 ENCOUNTER — Encounter: Payer: Self-pay | Admitting: Psychiatry

## 2024-09-05 ENCOUNTER — Ambulatory Visit (INDEPENDENT_AMBULATORY_CARE_PROVIDER_SITE_OTHER): Admitting: Psychiatry

## 2024-09-05 VITALS — BP 125/70 | HR 80 | Temp 97.7°F | Ht 71.0 in | Wt 194.4 lb

## 2024-09-05 DIAGNOSIS — F3341 Major depressive disorder, recurrent, in partial remission: Secondary | ICD-10-CM | POA: Diagnosis not present

## 2024-09-05 DIAGNOSIS — G4700 Insomnia, unspecified: Secondary | ICD-10-CM

## 2024-09-05 DIAGNOSIS — F3342 Major depressive disorder, recurrent, in full remission: Secondary | ICD-10-CM

## 2024-09-05 MED ORDER — SERTRALINE HCL 100 MG PO TABS: 150.0000 mg | ORAL_TABLET | Freq: Every day | ORAL | 5 refills | Status: AC

## 2024-09-05 MED ORDER — TRAZODONE HCL 100 MG PO TABS: 100.0000 mg | ORAL_TABLET | Freq: Every evening | ORAL | 5 refills | Status: AC | PRN

## 2024-09-05 NOTE — Patient Instructions (Signed)
 Continue sertraline  150 mg daily  Continue bupropion  300 mg daily  Continue Trazodone  100 mg at night as needed for insomnia Continue hydroxyzine  25 mg daily as needed for anxiety Next appointment: 2/9 at 11:30

## 2024-09-12 ENCOUNTER — Other Ambulatory Visit: Payer: Self-pay | Admitting: Orthopedic Surgery

## 2024-09-12 DIAGNOSIS — M1712 Unilateral primary osteoarthritis, left knee: Secondary | ICD-10-CM

## 2024-09-12 DIAGNOSIS — G8929 Other chronic pain: Secondary | ICD-10-CM

## 2024-09-14 DIAGNOSIS — J449 Chronic obstructive pulmonary disease, unspecified: Secondary | ICD-10-CM | POA: Diagnosis not present

## 2024-09-14 DIAGNOSIS — E7800 Pure hypercholesterolemia, unspecified: Secondary | ICD-10-CM | POA: Diagnosis not present

## 2024-09-14 DIAGNOSIS — Z0000 Encounter for general adult medical examination without abnormal findings: Secondary | ICD-10-CM | POA: Diagnosis not present

## 2024-09-14 DIAGNOSIS — F321 Major depressive disorder, single episode, moderate: Secondary | ICD-10-CM | POA: Diagnosis not present

## 2024-09-14 DIAGNOSIS — Z299 Encounter for prophylactic measures, unspecified: Secondary | ICD-10-CM | POA: Diagnosis not present

## 2024-09-14 DIAGNOSIS — R5383 Other fatigue: Secondary | ICD-10-CM | POA: Diagnosis not present

## 2024-09-14 DIAGNOSIS — E119 Type 2 diabetes mellitus without complications: Secondary | ICD-10-CM | POA: Diagnosis not present

## 2024-09-30 DIAGNOSIS — E1065 Type 1 diabetes mellitus with hyperglycemia: Secondary | ICD-10-CM | POA: Diagnosis not present

## 2024-10-12 DIAGNOSIS — E1065 Type 1 diabetes mellitus with hyperglycemia: Secondary | ICD-10-CM | POA: Diagnosis not present

## 2024-10-18 DIAGNOSIS — E785 Hyperlipidemia, unspecified: Secondary | ICD-10-CM | POA: Diagnosis not present

## 2024-10-18 DIAGNOSIS — E1065 Type 1 diabetes mellitus with hyperglycemia: Secondary | ICD-10-CM | POA: Diagnosis not present

## 2024-10-18 DIAGNOSIS — Z9641 Presence of insulin pump (external) (internal): Secondary | ICD-10-CM | POA: Diagnosis not present

## 2024-10-18 DIAGNOSIS — Z978 Presence of other specified devices: Secondary | ICD-10-CM | POA: Diagnosis not present

## 2024-10-18 DIAGNOSIS — E559 Vitamin D deficiency, unspecified: Secondary | ICD-10-CM | POA: Diagnosis not present

## 2024-11-15 ENCOUNTER — Other Ambulatory Visit: Payer: Self-pay | Admitting: Psychiatry

## 2025-01-02 ENCOUNTER — Ambulatory Visit: Admitting: Psychiatry
